# Patient Record
Sex: Male | Born: 1945
Health system: Southern US, Community
[De-identification: ages and names within clinical notes are randomized; demographics above are authoritative.]

## PROBLEM LIST (undated history)

## (undated) DIAGNOSIS — Z950 Presence of cardiac pacemaker: Secondary | ICD-10-CM

## (undated) DIAGNOSIS — I739 Peripheral vascular disease, unspecified: Secondary | ICD-10-CM

## (undated) DIAGNOSIS — R011 Cardiac murmur, unspecified: Secondary | ICD-10-CM

## (undated) DIAGNOSIS — M1990 Unspecified osteoarthritis, unspecified site: Secondary | ICD-10-CM

## (undated) DIAGNOSIS — I2510 Atherosclerotic heart disease of native coronary artery without angina pectoris: Secondary | ICD-10-CM

## (undated) DIAGNOSIS — I6529 Occlusion and stenosis of unspecified carotid artery: Secondary | ICD-10-CM

## (undated) DIAGNOSIS — I10 Essential (primary) hypertension: Secondary | ICD-10-CM

## (undated) HISTORY — DX: Unspecified osteoarthritis, unspecified site: M19.90

## (undated) HISTORY — PX: HERNIA REPAIR: SHX51

## (undated) HISTORY — DX: Cardiac murmur, unspecified: R01.1

## (undated) HISTORY — DX: Occlusion and stenosis of unspecified carotid artery: I65.29

## (undated) HISTORY — PX: CARDIAC SURGERY: SHX584

---
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
## 2017-04-17 HISTORY — PX: CARDIAC SURGERY: SHX584

## 2018-10-27 ENCOUNTER — Emergency Department (HOSPITAL_COMMUNITY): Payer: Medicare PPO

## 2018-10-27 ENCOUNTER — Encounter (HOSPITAL_COMMUNITY): Payer: Self-pay | Admitting: Emergency Medicine

## 2018-10-27 ENCOUNTER — Other Ambulatory Visit: Payer: Self-pay

## 2018-10-27 ENCOUNTER — Inpatient Hospital Stay (HOSPITAL_COMMUNITY)
Admission: EM | Admit: 2018-10-27 | Discharge: 2018-10-29 | DRG: 556 | Disposition: A | Discharge status: Home / Self Care | Payer: Medicare PPO | Attending: Interventional Cardiology | Admitting: Interventional Cardiology

## 2018-10-27 DIAGNOSIS — I708 Atherosclerosis of other arteries: Secondary | ICD-10-CM | POA: Diagnosis present

## 2018-10-27 DIAGNOSIS — I10 Essential (primary) hypertension: Secondary | ICD-10-CM | POA: Diagnosis present

## 2018-10-27 DIAGNOSIS — R319 Hematuria, unspecified: Secondary | ICD-10-CM | POA: Diagnosis not present

## 2018-10-27 DIAGNOSIS — Z1159 Encounter for screening for other viral diseases: Secondary | ICD-10-CM | POA: Diagnosis not present

## 2018-10-27 DIAGNOSIS — I2510 Atherosclerotic heart disease of native coronary artery without angina pectoris: Secondary | ICD-10-CM | POA: Diagnosis present

## 2018-10-27 DIAGNOSIS — I214 Non-ST elevation (NSTEMI) myocardial infarction: Secondary | ICD-10-CM | POA: Diagnosis present

## 2018-10-27 DIAGNOSIS — Z79899 Other long term (current) drug therapy: Secondary | ICD-10-CM

## 2018-10-27 DIAGNOSIS — Z7902 Long term (current) use of antithrombotics/antiplatelets: Secondary | ICD-10-CM

## 2018-10-27 DIAGNOSIS — R7989 Other specified abnormal findings of blood chemistry: Secondary | ICD-10-CM | POA: Diagnosis not present

## 2018-10-27 DIAGNOSIS — I700 Atherosclerosis of aorta: Secondary | ICD-10-CM | POA: Diagnosis present

## 2018-10-27 DIAGNOSIS — R739 Hyperglycemia, unspecified: Secondary | ICD-10-CM | POA: Diagnosis present

## 2018-10-27 DIAGNOSIS — M549 Dorsalgia, unspecified: Secondary | ICD-10-CM | POA: Diagnosis present

## 2018-10-27 DIAGNOSIS — Z7289 Other problems related to lifestyle: Secondary | ICD-10-CM

## 2018-10-27 DIAGNOSIS — R7303 Prediabetes: Secondary | ICD-10-CM | POA: Diagnosis present

## 2018-10-27 DIAGNOSIS — Z955 Presence of coronary angioplasty implant and graft: Secondary | ICD-10-CM | POA: Diagnosis not present

## 2018-10-27 DIAGNOSIS — I447 Left bundle-branch block, unspecified: Secondary | ICD-10-CM | POA: Diagnosis present

## 2018-10-27 DIAGNOSIS — I493 Ventricular premature depolarization: Secondary | ICD-10-CM | POA: Diagnosis present

## 2018-10-27 DIAGNOSIS — E860 Dehydration: Secondary | ICD-10-CM | POA: Diagnosis present

## 2018-10-27 DIAGNOSIS — E785 Hyperlipidemia, unspecified: Secondary | ICD-10-CM | POA: Diagnosis present

## 2018-10-27 DIAGNOSIS — I352 Nonrheumatic aortic (valve) stenosis with insufficiency: Secondary | ICD-10-CM | POA: Diagnosis present

## 2018-10-27 DIAGNOSIS — Z7982 Long term (current) use of aspirin: Secondary | ICD-10-CM | POA: Diagnosis not present

## 2018-10-27 DIAGNOSIS — I739 Peripheral vascular disease, unspecified: Secondary | ICD-10-CM | POA: Diagnosis present

## 2018-10-27 DIAGNOSIS — M25511 Pain in right shoulder: Secondary | ICD-10-CM

## 2018-10-27 DIAGNOSIS — M545 Low back pain: Secondary | ICD-10-CM | POA: Diagnosis not present

## 2018-10-27 DIAGNOSIS — M62838 Other muscle spasm: Secondary | ICD-10-CM | POA: Diagnosis present

## 2018-10-27 HISTORY — DX: Essential (primary) hypertension: I10

## 2018-10-27 HISTORY — DX: Atherosclerotic heart disease of native coronary artery without angina pectoris: I25.10

## 2018-10-27 HISTORY — DX: Peripheral vascular disease, unspecified: I73.9

## 2018-10-27 LAB — HEPATIC FUNCTION PANEL
ALT: 24 U/L (ref 0–44)
AST: 29 U/L (ref 15–41)
Albumin: 4.4 g/dL (ref 3.5–5.0)
Alkaline Phosphatase: 78 U/L (ref 38–126)
Bilirubin, Direct: 0.2 mg/dL (ref 0.0–0.2)
Indirect Bilirubin: 0.6 mg/dL (ref 0.3–0.9)
Total Bilirubin: 0.8 mg/dL (ref 0.3–1.2)
Total Protein: 7.3 g/dL (ref 6.5–8.1)

## 2018-10-27 LAB — CBC
HCT: 40.1 % (ref 39.0–52.0)
Hemoglobin: 13.8 g/dL (ref 13.0–17.0)
MCH: 31.9 pg (ref 26.0–34.0)
MCHC: 34.4 g/dL (ref 30.0–36.0)
MCV: 92.8 fL (ref 80.0–100.0)
Platelets: 237 10*3/uL (ref 150–400)
RBC: 4.32 MIL/uL (ref 4.22–5.81)
RDW: 13.6 % (ref 11.5–15.5)
WBC: 8.6 10*3/uL (ref 4.0–10.5)
nRBC: 0 % (ref 0.0–0.2)

## 2018-10-27 LAB — BASIC METABOLIC PANEL
Anion gap: 16 — ABNORMAL HIGH (ref 5–15)
BUN: 13 mg/dL (ref 8–23)
CO2: 20 mmol/L — ABNORMAL LOW (ref 22–32)
Calcium: 9.6 mg/dL (ref 8.9–10.3)
Chloride: 101 mmol/L (ref 98–111)
Creatinine, Ser: 1.15 mg/dL (ref 0.61–1.24)
GFR calc Af Amer: >60 mL/min (ref >60)
GFR calc non Af Amer: >60 mL/min (ref >60)
Glucose, Bld: 183 mg/dL — ABNORMAL HIGH (ref 70–99)
Potassium: 3.9 mmol/L (ref 3.5–5.1)
Sodium: 137 mmol/L (ref 135–145)

## 2018-10-27 LAB — TROPONIN I (HIGH SENSITIVITY)
Troponin I (High Sensitivity): 8 ng/L (ref <18)
Troponin I (High Sensitivity): 51 ng/L — ABNORMAL HIGH (ref <18)

## 2018-10-27 LAB — SARS CORONAVIRUS 2 BY RT PCR (HOSPITAL ORDER, PERFORMED IN ~~LOC~~ HOSPITAL LAB): SARS Coronavirus 2: NEGATIVE

## 2018-10-27 LAB — LIPASE, BLOOD: Lipase: 29 U/L (ref 11–51)

## 2018-10-27 MED ORDER — FENTANYL CITRATE (PF) 100 MCG/2ML IJ SOLN
100.0000 ug | Freq: Once | INTRAMUSCULAR | Status: DC
  Filled 2018-10-27: qty 2

## 2018-10-27 MED ORDER — ASPIRIN 81 MG PO CHEW
81.0000 mg | CHEWABLE_TABLET | Freq: Every day | ORAL | Status: DC
  Administered 2018-10-28 – 2018-10-29 (×2): 81 mg via ORAL
  Filled 2018-10-27 (×2): qty 1

## 2018-10-27 MED ORDER — ONDANSETRON HCL 4 MG/2ML IJ SOLN: 4.0000 mg | Freq: Four times a day (QID) | INTRAMUSCULAR | Status: DC | PRN

## 2018-10-27 MED ORDER — LORAZEPAM 2 MG/ML IJ SOLN
1.0000 mg | Freq: Once | INTRAMUSCULAR | Status: AC
  Administered 2018-10-27: 1 mg via INTRAVENOUS
  Filled 2018-10-27: qty 1

## 2018-10-27 MED ORDER — RAMIPRIL 10 MG PO CAPS
10.0000 mg | ORAL_CAPSULE | Freq: Every day | ORAL | Status: DC
  Administered 2018-10-28 – 2018-10-29 (×2): 10 mg via ORAL
  Filled 2018-10-27 (×2): qty 1

## 2018-10-27 MED ORDER — CLOPIDOGREL BISULFATE 75 MG PO TABS
75.0000 mg | ORAL_TABLET | Freq: Every day | ORAL | Status: DC
  Administered 2018-10-28 – 2018-10-29 (×2): 75 mg via ORAL
  Filled 2018-10-27 (×2): qty 1

## 2018-10-27 MED ORDER — HEPARIN BOLUS VIA INFUSION
4000.0000 [IU] | Freq: Once | INTRAVENOUS | Status: AC
  Administered 2018-10-28: 4000 [IU] via INTRAVENOUS
  Filled 2018-10-27: qty 4000

## 2018-10-27 MED ORDER — IOHEXOL 350 MG/ML SOLN
75.0000 mL | Freq: Once | INTRAVENOUS | Status: AC | PRN
  Administered 2018-10-27: 75 mL via INTRAVENOUS

## 2018-10-27 MED ORDER — HEPARIN (PORCINE) 25000 UT/250ML-% IV SOLN
1200.0000 [IU]/h | INTRAVENOUS | Status: DC
  Administered 2018-10-28: 1200 [IU]/h via INTRAVENOUS
  Filled 2018-10-27: qty 250

## 2018-10-27 MED ORDER — METOPROLOL TARTRATE 12.5 MG HALF TABLET
12.5000 mg | ORAL_TABLET | Freq: Two times a day (BID) | ORAL | Status: DC
  Filled 2018-10-27: qty 1

## 2018-10-27 MED ORDER — DIPHENHYDRAMINE HCL 25 MG PO CAPS: 25.0000 mg | ORAL_CAPSULE | Freq: Every evening | ORAL | Status: DC | PRN

## 2018-10-27 MED ORDER — NITROGLYCERIN 0.4 MG SL SUBL: 0.4000 mg | SUBLINGUAL_TABLET | SUBLINGUAL | Status: DC | PRN

## 2018-10-27 MED ORDER — INSULIN ASPART 100 UNIT/ML ~~LOC~~ SOLN
0.0000 [IU] | SUBCUTANEOUS | Status: DC
  Administered 2018-10-28 (×2): 2 [IU] via SUBCUTANEOUS

## 2018-10-27 MED ORDER — DIPHENHYDRAMINE HCL 25 MG PO TABS
25.0000 mg | ORAL_TABLET | Freq: Every evening | ORAL | Status: DC | PRN
  Filled 2018-10-27: qty 1

## 2018-10-27 MED ORDER — HYDROMORPHONE HCL 1 MG/ML IJ SOLN
0.5000 mg | Freq: Once | INTRAMUSCULAR | Status: AC
  Administered 2018-10-27: 0.5 mg via INTRAVENOUS
  Filled 2018-10-27: qty 1

## 2018-10-27 MED ORDER — SODIUM CHLORIDE 0.9 % IV BOLUS
1000.0000 mL | Freq: Once | INTRAVENOUS | Status: AC
  Administered 2018-10-27: 1000 mL via INTRAVENOUS

## 2018-10-27 MED ORDER — MORPHINE SULFATE (PF) 2 MG/ML IV SOLN
2.0000 mg | Freq: Once | INTRAVENOUS | Status: DC
  Filled 2018-10-27: qty 1

## 2018-10-27 MED ORDER — ASPIRIN 81 MG PO CHEW
324.0000 mg | CHEWABLE_TABLET | Freq: Once | ORAL | Status: AC
  Administered 2018-10-27: 324 mg via ORAL
  Filled 2018-10-27: qty 4

## 2018-10-27 MED ORDER — ASENAPINE MALEATE 5 MG SL SUBL
5.0000 mg | SUBLINGUAL_TABLET | Freq: Once | SUBLINGUAL | Status: AC
  Administered 2018-10-27: 5 mg via SUBLINGUAL
  Filled 2018-10-27: qty 1

## 2018-10-27 MED ORDER — ATORVASTATIN CALCIUM 80 MG PO TABS
80.0000 mg | ORAL_TABLET | Freq: Every day | ORAL | Status: DC
  Administered 2018-10-28 (×2): 80 mg via ORAL
  Filled 2018-10-27 (×2): qty 1

## 2018-10-27 NOTE — ED Notes (Signed)
Pt to ct 

## 2018-10-27 NOTE — Discharge Instructions (Signed)
You have been seen today for shoulder pain. Please read and follow all provided instructions. Return to the emergency room for worsening condition or new concerning symptoms.    1. Medications:  Prescription sent to your pharmacy for  This is a muscle relaxer and should only be taken as needed and directed. Please do not drive or work while taking this medication. Continue usual home medications  Take medications as prescribed. Please review all of the medicines and only take them if you do not have an allergy to them.   2. Treatment: rest, drink plenty of fluids  3. Follow Up: Please follow up with your primary doctor in 2-5 days for discussion of your diagnoses and further evaluation after today's visit; Call today to arrange your follow up.  If you do not have a primary care doctor use the resource guide provided to find one;  -You should call [facility] to establish care with a cardiologist.  Information has been provided in your discharge paperwork.  It is also a possibility that you have an allergic reaction to any of the medicines that you have been prescribed - Everybody reacts differently to medications and while MOST people have no trouble with most medicines, you may have a reaction such as nausea, vomiting, rash, swelling, shortness of breath. If this is the case, please stop taking the medicine immediately and contact your physician.  ?

## 2018-10-27 NOTE — H&P (Addendum)
Cardiology History & Physical    Patient ID: Glenn Collins Zieger MRN: [id number], DOB/AGE: 07-16-1945   Admit date: 10/27/2018  Primary Cardiologist: Dr. Charmayne SheerJames Crager [facility] not yet established in [place]  Patient Profile    Glenn Collins Vaile is a 73 y.o. male with a history of HTN, hyperlipidemia, mild aortic stenosis, recently diagnosed symptomatic PAD (medically managed), as well as CAD with PCI in 2007 (Cypher to mid RCA, prox RCA, mid LAD) and then in 2017 for progressive angina with overlapping DES for up to 90% ISR of the mid RCA stent. He is being seen today for the evaluation of back pain associated with elevated troponin and TWI on ECG.   History of Present Illness    While walking to his kitchen shortly after waking up this morning he describes sudden onset pain below his right scapula around 9am that felt like fairly severe muscle spasms. He thinks there may have been some minimal improvement initially with a heating pad, but the pain became worse to a constant 10/10 that came around his right side and was associated with nausea and "cold sweats" by around 1pm. He has had much more mild "back spasms" like this before but not for a long time and nothing this severe. He denies any associated dyspnea, stroke-like symptoms, or actual chest pain. This did not feel like his prior angina which was exertional substernal burning radiating to his arms and neck. He still tries to walk every day, but has been limited more recently by angina and frequently has to stop to rest. Given these limitations, he does not have any angina. He has not missed any of his medications. Denies any abnormal bleeding.  In the ED he was bradycardic to the 40s-50s and hypertensive on arrival. ECG showed SR with anterior TWI not mentioned on prior ECG reads from [place]. HS troponin increased from 8 to 51. CTA was negative for dissection or PE - there was an area hypodense area surrounding the lateral edge of the descending  aorta that was reviewed by multiple radiologists, including IR, as well as CT surgery, and all agreed not consistent with dissection. He was given dilaudid and Ativan with marked improvement, currently 3-4/10.   Past Medical History   Past Medical History:  Diagnosis Date   CAD (coronary artery disease) 10/27/2018   Coronary artery disease    Hypertension    PAD (peripheral artery disease) (HCC) 10/27/2018    Past Surgical History:  Procedure Laterality Date   CARDIAC SURGERY       Allergies No Known Allergies  Home Medications    Prior to Admission medications   Medication Sig Start Date End Date Taking? Authorizing Provider  aspirin EC 81 MG tablet Take 81 mg by mouth daily.   Yes [provider]  atorvastatin (LIPITOR) 80 MG tablet Take 80 mg by mouth daily at 6 PM.   Yes [provider]  clopidogrel (PLAVIX) 75 MG tablet Take 75 mg by mouth daily.   Yes [provider]  diphenhydrAMINE (BENADRYL) 25 MG tablet Take 25 mg by mouth at bedtime as needed for sleep.   Yes [provider]  metoprolol tartrate (LOPRESSOR) 25 MG tablet Take 12.5 mg by mouth 2 (two) times daily.   Yes [provider]  ramipril (ALTACE) 10 MG capsule Take 10 mg by mouth daily.   Yes [provider]    Family History    No family history on file. has no family status information on file.  Social History    Social History   Socioeconomic History   Marital status: Married    Spouse name: Not on file   Number of children: Not on file   Years of education: Not on file   Highest education level: Not on file  Occupational History   Not on file  Social Needs   Financial resource strain: Not on file   Food insecurity    Worry: Not on file    Inability: Not on file   Transportation needs    Medical: Not on file    Non-medical: Not on file  Tobacco Use   Smoking status: Former Smoker  Substance and Sexual Activity   Alcohol  use: Yes   Drug use: Never   Sexual activity: Not on file  Lifestyle   Physical activity    Days per week: Not on file    Minutes per session: Not on file   Stress: Not on file  Relationships   Social connections    Talks on phone: Not on file    Gets together: Not on file    Attends religious service: Not on file    Active member of club or organization: Not on file    Attends meetings of clubs or organizations: Not on file    Relationship status: Not on file   Intimate partner violence    Fear of current or ex partner: Not on file    Emotionally abused: Not on file    Physically abused: Not on file    Forced sexual activity: Not on file  Other Topics Concern   Not on file  Social History Narrative   Not on file     Review of Systems    General:  No chills, fever, night sweats or weight changes.  Cardiovascular:  No chest pain, dyspnea on exertion, edema, orthopnea, palpitations, paroxysmal nocturnal dyspnea. Dermatological: "knot" on back. No rash.  Respiratory: No cough, dyspnea Urologic: No hematuria, dysuria Abdominal:   +Nausea now resolved. No vomiting, diarrhea, hematochezia, melena, or hematemesis Neurologic:  No visual changes, weakness, changes in mental status. All other systems reviewed and are otherwise negative except as noted above.  Physical Exam    BP (!) 142/68    Pulse 62    Resp 14    Ht 6' (1.829 m)    Wt 90 kg    SpO2 98%    BMI 26.91 kg/m  General: Alert, NAD HEENT: Normal  Neck: No bruits or JVD. Lungs:  Resp regular and unlabored, CTA bilaterally. Heart: Regular rhythm with short pause occasionally, 2/6 early peaking systolic murmur at base radiating to carotids, 2-3/6 holosystolic murmur at apex. Back: approx 3 cm freely mobile soft tissue nodule inferior to right scapula, nontender. No CVA tenderness Abdomen: Soft, non-tender, non-distended, BS +.  Extremities: Cool from ankles distally, otherwise warm. No clubbing, cyanosis or  edema.  DP pulses 1+ and radial pulses 1-2+ bilaterally and equal.  Psych: Normal affect. Neuro: Alert and oriented. No gross focal deficits. No abnormal movements.  Labs    HS Troponin:  8 -> 51  Lab Results  Component Value Date   WBC 8.6 10/27/2018   HGB 13.8 10/27/2018   HCT 40.1 10/27/2018   MCV 92.8 10/27/2018   PLT 237 10/27/2018    Recent Labs  Lab 10/27/18 1414  NA 137  K 3.9  CL 101  CO2 20*  BUN 13  CREATININE 1.15  CALCIUM 9.6  PROT 7.3  BILITOT  0.8  ALKPHOS 78  ALT 24  AST 29  GLUCOSE 183*   No results found for: CHOL, HDL, LDLCALC, TRIG No results found for: [facility]   Radiology Studies    Dg Chest Port 1 View  Result Date: 10/27/2018 CLINICAL DATA:  Back pain. EXAM: PORTABLE CHEST 1 VIEW COMPARISON:  None. FINDINGS: Cardiomediastinal silhouette is normal. Mediastinal contours appear intact. Tortuosity and calcific atherosclerotic disease of the aorta. There is no evidence of focal airspace consolidation, pleural effusion or pneumothorax. Osseous structures are without acute abnormality. Soft tissues are grossly normal. IMPRESSION: No active disease. Electronically Signed   By: Fidela Salisbury M.D.   On: 10/27/2018 14:58   Ct Angio Chest Aorta W And/or Wo Contrast  Addendum Date: 10/27/2018   ADDENDUM REPORT: 10/27/2018 20:48 ADDENDUM: Hila additional finding not mentioned in the report. There is a 9 mm right lower lobe calcified lung nodule, either representing large granuloma or a hamartoma. Slight increased density along the distal arch and descending thoracic aorta, felt to be within the lung parenchyma and not related to intramural hematoma or dissection. Electronically Signed   By: Donavan Foil M.D.   On: 10/27/2018 20:48   Result Date: 10/27/2018 CLINICAL DATA:  Back pain history of cardiac surgery EXAM: CT ANGIOGRAPHY CHEST WITH CONTRAST TECHNIQUE: Multidetector CT imaging of the chest was performed using the standard protocol during bolus  administration of intravenous contrast. Multiplanar CT image reconstructions and MIPs were obtained to evaluate the vascular anatomy. CONTRAST:  14mL OMNIPAQUE IOHEXOL 350 MG/ML SOLN COMPARISON:  None. FINDINGS: Cardiovascular: Satisfactory opacification of the pulmonary arteries to the segmental level. No evidence of pulmonary embolism. Ectatic ascending aorta up to 3.7 cm. No dissection is seen. Moderate aortic atherosclerosis. Coronary vascular calcification. Mild cardiomegaly. No pericardial effusion. Calcification and moderate narrowing of the right subclavian artery at its origin. Mediastinum/Nodes: No enlarged mediastinal, hilar, or axillary lymph nodes. Thyroid gland, trachea, and esophagus demonstrate no significant findings. Lungs/Pleura: Lungs are clear. No pleural effusion or pneumothorax. Upper Abdomen: No acute abnormality. Musculoskeletal: Degenerative changes. No acute or suspicious osseous abnormality. Review of the MIP images confirms the above findings. IMPRESSION: 1. Negative for acute pulmonary embolus or aortic dissection. 2. No acute pulmonary infiltrate. Aortic Atherosclerosis (ICD10-I70.0). Electronically Signed: By: Donavan Foil M.D. On: 10/27/2018 20:10    ECG & Cardiac Imaging    ECG: sinus bradycardia with sinus arrhythmia and occasional junctional escape beat, anterior/anteroseptal TWI consistent with ischemia - personally reviewed.  TTE 10/2017: EF 60%, mild AS and AI, mean gradient 12.6.  Assessment & Plan    CAD with possible NSTEMI Prior remote PCI to RCA and LAD with ISR of mid-RCA treated with overlapping stents in 2017 for worsening angina and has done well since then. His back pain sounds musculoskeletal though was associated with associated with nausea and diaphoresis and I do not have a good alternate explanation for his elevated troponin and ischemic t-wave changes. After personal review with radiology, his CTA is negative for dissection or PE. He is also  incredibly high risk with extensive and progressive vascular disease so will treat empirically for NSTEMI for now. Additionally, his claudication is now limiting and could be masking anginal symptoms. - Trend troponin and repeat ECG on admission - TTE, lipids, and A1c - Received full dose aspirin. Continue DAPT with aspirin and Plavix - Continue home atorvastatin 80mg  - Hold metoprolol for tonight due to sinus bradycardia. Sublingual nitro prn. - NPO for likely left heart cath tomorrow. Received  1L NS in ED.   Acute back pain: seems musculoskeletal by history, soft tissue nodule on exam and on CT, though otherwise exam unrevealing. CTA negative for VTE or acute aortic syndrome. There is moderate subclavian stenosis of the subclavian but likely un-releated. Some radiation to superior right flank, but probably too high for nephroureteral etiology. Significantly improved with opiate+benzo given in ED.   - Valium 2mg  + Tylenol - Check UA - Outpatient follow-up  Hypertension  Pressure now normalized with pain control and SL nitro.  - Home meds - ramipril and metoprolol  Unhealthy Alcohol Use: Drinks 3-4 glasses of wine per night. Counseled on alcohol use. Will hold off on CIWA for now and monitor.   PAD: ABIs 0.5-0.6 bilaterally with claudication and waveform c/w aorto-iliac disease. Extensive vascular calcification on prior abdominal imaging from 2015. Non-smoker. No evidence of limb ischemia.  - On DAPT and high-intensity statin.  - Referral to exercise program at discharge/follow-up  Reported borderline diabetes, mild hyperglycemia: - Check A1c - SSI with hyperglycemia protocol  Nutrition: Heart healthy, NPO at midnight DVT ppx: therapeutic heparin GI ppx: n/a Advanced Care Planning: Full Code   Signed, Clerance LavScott W Tamora Huneke, MD 10/27/2018, 10:14 PM

## 2018-10-27 NOTE — ED Provider Notes (Signed)
Care assumed from [place] PA-C.  Please see her full H&P.  In short,  Glenn Collins is a 73 y.o. male presents for right shoulder spasms.  He has cardiac history as well as his age and risk factors cardiac work-up was initiated.  CBC is normal, CMP suggestive of dehydration with low bicarb of 20, slightly elevated anion gap and hyperglycemia of 183.  Patient has no history of diabetes.  He was out in the sun for a long time yesterday while washing his car and had a little p.o. intake.  High-sensitivity troponin is 8.  Second troponin pending, IVF. On my assessment patient reports shoulder pain has improved with ice pack. He continues to have right sided chest pain however.  Physical Exam  BP (!) 144/49   Pulse (!) 46   Resp 10   Ht 6' (1.829 m)   Wt 90 kg   SpO2 97%   BMI 26.91 kg/m     Physical Exam  Vital signs and nursing note reviewed.  PE: Constitutional: well-developed, well-nourished, no apparent distress.  Resting comfortably on exam stretcher. HENT: normocephalic, atraumatic. no cervical adenopathy Cardiovascular: normal rhythm, bradycardic, distal pulses intact Pulmonary/Chest: effort normal; breath sounds clear and equal bilaterally; no wheezes or rales Abdominal: soft and nontender Musculoskeletal: full ROM, no edema Neurological: alert with goal directed thinking Skin: warm and dry, no rash, no diaphoresis Psychiatric: normal mood and affect, normal behavior    ED Course/Procedures   Results for orders placed or performed during the hospital encounter of 10/27/18 (from the past 24 hour(s))  Basic metabolic panel     Status: Abnormal   Collection Time: 10/27/18  2:14 PM  Result Value Ref Range   Sodium 137 135 - 145 mmol/L   Potassium 3.9 3.5 - 5.1 mmol/L   Chloride 101 98 - 111 mmol/L   CO2 20 (L) 22 - 32 mmol/L   Glucose, Bld 183 (H) 70 - 99 mg/dL   BUN 13 8 - 23 mg/dL   Creatinine, Ser 1.15 0.61 - 1.24 mg/dL   Calcium 9.6 8.9 - 10.3 mg/dL   GFR calc non  Af Amer >60 >60 mL/min   GFR calc Af Amer >60 >60 mL/min   Anion gap 16 (H) 5 - 15  CBC     Status: None   Collection Time: 10/27/18  2:14 PM  Result Value Ref Range   WBC 8.6 4.0 - 10.5 K/uL   RBC 4.32 4.22 - 5.81 MIL/uL   Hemoglobin 13.8 13.0 - 17.0 g/dL   HCT 40.1 39.0 - 52.0 %   MCV 92.8 80.0 - 100.0 fL   MCH 31.9 26.0 - 34.0 pg   MCHC 34.4 30.0 - 36.0 g/dL   RDW 13.6 11.5 - 15.5 %   Platelets 237 150 - 400 K/uL   nRBC 0.0 0.0 - 0.2 %  Troponin I (High Sensitivity)     Status: None   Collection Time: 10/27/18  2:14 PM  Result Value Ref Range   Troponin I (High Sensitivity) 8 <18 ng/L  Hepatic function panel     Status: None   Collection Time: 10/27/18  2:14 PM  Result Value Ref Range   Total Protein 7.3 6.5 - 8.1 g/dL   Albumin 4.4 3.5 - 5.0 g/dL   AST 29 15 - 41 U/L   ALT 24 0 - 44 U/L   Alkaline Phosphatase 78 38 - 126 U/L   Total Bilirubin 0.8 0.3 - 1.2 mg/dL   Bilirubin,  Direct 0.2 0.0 - 0.2 mg/dL   Indirect Bilirubin 0.6 0.3 - 0.9 mg/dL  Lipase, blood     Status: None   Collection Time: 10/27/18  2:14 PM  Result Value Ref Range   Lipase 29 11 - 51 U/L   PORTABLE CHEST 1 VIEW COMPARISON: None. FINDINGS: Cardiomediastinal silhouette is normal. Mediastinal contours appear intact. Tortuosity and calcific atherosclerotic disease of the aorta. There is no evidence of focal airspace consolidation, pleural effusion or pneumothorax. Osseous structures are without acute abnormality. Soft tissues are grossly normal. IMPRESSION: No active disease. Electronically Signed By: Ted Mcalpineobrinka Dimitrova M.D. On: 10/27/2018 14:58     MDM   Pt is stable, resting comfortably on stretcher. He has been bradycardic while in ED. HS troponin 8, delta 51. Given pt's age, cardiac history, and troponins will consult cardiology for admission. EKG concerning for ischemia.  Spoke with Dr. Okey Dupreose who agrees to assume care of patient and bring into the hospital for further evaluation and  management.     This note was prepared using Dragon voice recognition software and may include unintentional dictation errors due to the inherent limitations of voice recognition software.     Sherene Sireslbrizze, Shawna Wearing E, PA-C 10/28/18 0258    Gerhard MunchLockwood, Robert, MD 10/28/18 [id number]

## 2018-10-27 NOTE — Progress Notes (Signed)
ANTICOAGULATION CONSULT NOTE - Initial Consult  Pharmacy Consult for heparin Indication: chest pain/ACS  No Known Allergies  Patient Measurements: Height: 6' (182.9 cm) Weight: 198 lb 6.6 oz (90 kg) IBW/kg (Calculated) : 77.6 Heparin Dosing Weight: 90 kg  Vital Signs: BP: 142/68 (07/12 2100) Pulse Rate: 62 (07/12 2100)  Labs: Recent Labs    10/27/18 1414 10/27/18 1700  HGB 13.8  --   HCT 40.1  --   PLT 237  --   CREATININE 1.15  --   TROPONINIHS 8 51*    Estimated Creatinine Clearance: 62.8 mL/min (by C-G formula based on SCr of 1.15 mg/dL).   Medical History: Past Medical History:  Diagnosis Date  . CAD (coronary artery disease) 10/27/2018  . Coronary artery disease   . Hypertension   . PAD (peripheral artery disease) ([facility]) 10/27/2018    Medications:  See medication history  Assessment: 73 yo man to start heparin for CP.  He was not on anticoagulation PTA. Hg 13.8, PTLC 237   Goal of Therapy:  Heparin level 0.3-0.7 units/ml Monitor platelets by anticoagulation protocol: Yes   Plan:  Heparin bolus 4000 units and drip at 1200 units/hr Check heparin level 6-8 hours after start Daily HL and CBC while on heparin Monitor for bleeding complications  Thanks for allowing pharmacy to be a part of this patient's care.  Excell Seltzer, PharmD Clinical Pharmacist 10/27/2018,10:22 PM

## 2018-10-27 NOTE — ED Notes (Signed)
Consult at the bedside.

## 2018-10-27 NOTE — ED Provider Notes (Signed)
[facility] Provider Note   CSN: [id number] Arrival date & time: 10/27/18  1407     History   Chief Complaint Chief Complaint  Patient presents with  . Abdominal Pain  . Shoulder Pain    HPI Glenn LeakRichard Collins is a 73 y.o. male who presents with R shoulder pain.  Past medical history significant for coronary artery disease status post stents. He states that the pain in his R shoulder blade started at 9AM this morning. The pain is constant and feels like severe spasms.  It radiates around the right lateral chest wall. He denies chest or abdominal pain or SOB. He tried a heating pad with no relief. The spasms wouldn't stop so EMS was called. They gave Zofran and Fentanyl without significant relief.. It feels better when he puts pressure on his abdomen. He denies fever, chills, cough, N/V, leg swelling. He has never had this before. He states he was washing his car yesterday and that might have something to do with it. He denies trauma to the shoulder.  He states that this feels unlike the chest pain that he had that led up to his cath.  [place] and moved to the area about 1 year ago.Marland Kitchen.  He has yet to establish care with a PCP or cardiologist.  HPI  No past medical history on file.  There are no active problems to display for this patient.    The histories are not reviewed yet. Please review them in the "History" navigator section and refresh this SmartLink.      Home Medications    Prior to Admission medications   Not on File    Family History No family history on file.  Social History Social History   Tobacco Use  . Smoking status: Not on file  Substance Use Topics  . Alcohol use: Not on file  . Drug use: Not on file     Allergies   Patient has no known allergies.   Review of Systems Review of Systems  Respiratory: Negative for shortness of breath.   Cardiovascular: Negative for chest pain.   Gastrointestinal: Negative for abdominal pain.  Musculoskeletal: Positive for arthralgias and myalgias.  Neurological: Negative for syncope and light-headedness.  All other systems reviewed and are negative.    Physical Exam Updated Vital Signs BP (!) 161/93   Pulse (!) 55   Resp (!) 24   Ht 6' (1.829 m)   Wt 90 kg   SpO2 100%   BMI 26.91 kg/m   Physical Exam Vitals signs and nursing note reviewed.  Constitutional:      General: He is in acute distress (in pain).     Appearance: He is well-developed. He is not ill-appearing.  HENT:     Head: Normocephalic and atraumatic.  Eyes:     General: No scleral icterus.       Right eye: No discharge.        Left eye: No discharge.     Conjunctiva/sclera: Conjunctivae normal.     Pupils: Pupils are equal, round, and reactive to light.  Neck:     Musculoskeletal: Normal range of motion.  Cardiovascular:     Rate and Rhythm: Normal rate and regular rhythm.  Pulmonary:     Effort: Pulmonary effort is normal. No respiratory distress.     Breath sounds: Normal breath sounds.  Chest:     Chest wall: No tenderness.  Abdominal:     General:  There is no distension.     Palpations: Abdomen is soft.     Tenderness: There is no abdominal tenderness.  Musculoskeletal:     Comments: FROM of R shoulder. Palpable spasms felt under the R scapula  Skin:    General: Skin is warm and dry.  Neurological:     Mental Status: He is alert and oriented to person, place, and time.  Psychiatric:        Behavior: Behavior normal.      ED Treatments / Results  Labs (all labs ordered are listed, but only abnormal results are displayed) Labs Reviewed  BASIC METABOLIC PANEL - Abnormal; Notable for the following components:      Result Value   CO2 20 (*)    Glucose, Bld 183 (*)    Anion gap 16 (*)    All other components within normal limits  CBC  HEPATIC FUNCTION PANEL  LIPASE, BLOOD  TROPONIN I (HIGH SENSITIVITY)  TROPONIN I (HIGH  SENSITIVITY)    EKG EKG Interpretation  Date/Time:  Sunday October 27 2018 14:11:18 EDT Ventricular Rate:  53 PR Interval:    QRS Duration: 110 QT Interval:  451 QTC Calculation: 403 R Axis:   43 Text Interpretation:  Sinus or ectopic atrial bradycardia Incomplete left bundle branch block Abnormal T, consider ischemia, anterior leads no STEMI but ischemic appearing t wave inversions with no old comparison Confirmed by Charlesetta Shanks [id number]) on 10/27/2018 2:31:42 PM   Radiology Dg Chest Port 1 View  Result Date: 10/27/2018 CLINICAL DATA:  Back pain. EXAM: PORTABLE CHEST 1 VIEW COMPARISON:  None. FINDINGS: Cardiomediastinal silhouette is normal. Mediastinal contours appear intact. Tortuosity and calcific atherosclerotic disease of the aorta. There is no evidence of focal airspace consolidation, pleural effusion or pneumothorax. Osseous structures are without acute abnormality. Soft tissues are grossly normal. IMPRESSION: No active disease. Electronically Signed   By: Fidela Salisbury M.D.   On: 10/27/2018 14:58    Procedures Procedures (including critical care time)  Medications Ordered in ED Medications  sodium chloride 0.9 % bolus 1,000 mL (has no administration in time range)  LORazepam (ATIVAN) injection 1 mg (1 mg Intravenous Given 10/27/18 1429)  HYDROmorphone (DILAUDID) injection 0.5 mg (0.5 mg Intravenous Given 10/27/18 1430)  aspirin chewable tablet 324 mg (324 mg Oral Given 10/27/18 1431)     Initial Impression / Assessment and Plan / ED Course  I have reviewed the triage vital signs and the nursing notes.  Pertinent labs & imaging results that were available during my care of the patient were reviewed by me and considered in my medical decision making (see chart for details).  73 year old male presents with acute right shoulder pain this morning.  Feels like severe spasms.  He does not have any chest pain or abdominal pain.  It doesn't feel like angina that he's had  before. There is a palpable muscle spasms felt on exam. Vitals are remarkable for hypertension and bradycardia. EKG is sinus bradycardia with T wave inversions.  We have no EKG to compare this to since he is relatively new to the area.  We will give Ativan and Dilaudid and check labs, chest x-ray, troponin.  Shared visit with Dr. Colvin Caroli.  CBC is normal.  CMP is remarkable for mildly low bicarb 20, hyperglycemia 183, slightly elevated anion gap.  HS-Trop is 8. Patient states he is out in the side for a long time yesterday and feels a little dehydrated.  We will give IV fluid bolus.  He appears much more comfortable on repeat exam and states pain is a 5 out of 10 at this time.  We will continue to monitor.  Will plan on delta trop. Care signed out to K Albrizze PA-C who will dispo  Final Clinical Impressions(s) / ED Diagnoses   Final diagnoses:  Acute pain of right shoulder    ED Discharge Orders    None       Bethel BornGekas, Kelly Marie, PA-C 10/27/18 1634    Arby BarrettePfeiffer, Marcy, MD 11/06/18 1232

## 2018-10-27 NOTE — ED Triage Notes (Signed)
Arrived via EMS developed sudden onset of right shoulder blade pain radiating to back and general abdominal pain. Pain currently 9/10 achy sharp. EMS administered Zofran 4mg  and Fentanyl 100 mcg prior to arrival. Alert answering and following commands appropriate.

## 2018-10-27 NOTE — ED Provider Notes (Addendum)
Medical screening examination/treatment/procedure(s) were conducted as a shared visit with non-physician practitioner(s) and myself.  I personally evaluated the patient during the encounter.    Patient felt that he was having spasms under his shoulder blade starting this morning on the right.  He was trying heating pad but it continued to get worse and worse until is very severe this afternoon.  Finally had to come to the emergency department.  He denies he is felt short of breath.  He denies the pain is really radiating to his chest or his arm.  Patient was given fentanyl and Zofran by EMS without relief.  Patient is alert and appropriate.  Mental status is clear.  He appears to be in severe pain and is holding his shoulder.  No respiratory distress.  Lungs clear.  Heart regular.  Patient has intensely reproducible pain along the medial scapula on the right shoulder. No Lower extremity swelling or calf pain  Plan will be for cardiac work-up.  Patient does have EKG with some biphasic T waves concerning for ischemic changes but no STEMI without old comparison tracing available.  Presentation is somewhat atypical but with patient's age and risk factors will proceed with work-up while working on pain control.  I agree with plan and management.  EKG Interpretation  Date/Time:  Sunday October 27 2018 14:11:18 EDT Ventricular Rate:  53 PR Interval:    QRS Duration: 110 QT Interval:  451 QTC Calculation: 403 R Axis:   43 Text Interpretation:  Sinus or ectopic atrial bradycardia Incomplete left bundle branch block Abnormal T, consider ischemia, anterior leads no STEMI but ischemic appearing t wave inversions with no old comparison Confirmed by Charlesetta Shanks [phone]) on 10/27/2018 2:31:42 PM   Charlesetta Shanks, MD 10/27/18 [place], [place], MD 10/27/18 1432

## 2018-10-27 NOTE — ED Notes (Signed)
The pt has no complaints 

## 2018-10-28 ENCOUNTER — Encounter (HOSPITAL_COMMUNITY): Payer: Self-pay

## 2018-10-28 ENCOUNTER — Inpatient Hospital Stay (HOSPITAL_COMMUNITY): Payer: Medicare PPO

## 2018-10-28 ENCOUNTER — Other Ambulatory Visit: Payer: Self-pay

## 2018-10-28 DIAGNOSIS — I10 Essential (primary) hypertension: Secondary | ICD-10-CM

## 2018-10-28 DIAGNOSIS — R7989 Other specified abnormal findings of blood chemistry: Secondary | ICD-10-CM

## 2018-10-28 DIAGNOSIS — M549 Dorsalgia, unspecified: Secondary | ICD-10-CM

## 2018-10-28 DIAGNOSIS — I214 Non-ST elevation (NSTEMI) myocardial infarction: Secondary | ICD-10-CM

## 2018-10-28 DIAGNOSIS — I739 Peripheral vascular disease, unspecified: Secondary | ICD-10-CM

## 2018-10-28 LAB — ECHOCARDIOGRAM COMPLETE
Height: 72 in
Weight: 3224 oz

## 2018-10-28 LAB — BASIC METABOLIC PANEL
Anion gap: 13 (ref 5–15)
BUN: 13 mg/dL (ref 8–23)
CO2: 20 mmol/L — ABNORMAL LOW (ref 22–32)
Calcium: 9.1 mg/dL (ref 8.9–10.3)
Chloride: 103 mmol/L (ref 98–111)
Creatinine, Ser: 0.88 mg/dL (ref 0.61–1.24)
GFR calc Af Amer: >60 mL/min (ref >60)
GFR calc non Af Amer: >60 mL/min (ref >60)
Glucose, Bld: 123 mg/dL — ABNORMAL HIGH (ref 70–99)
Potassium: 4.2 mmol/L (ref 3.5–5.1)
Sodium: 136 mmol/L (ref 135–145)

## 2018-10-28 LAB — MAGNESIUM: Magnesium: 2.3 mg/dL (ref 1.7–2.4)

## 2018-10-28 LAB — HEMOGLOBIN A1C
Hgb A1c MFr Bld: 5.5 % (ref 4.8–5.6)
Mean Plasma Glucose: 111.15 mg/dL

## 2018-10-28 LAB — URINALYSIS, ROUTINE W REFLEX MICROSCOPIC
Bacteria, UA: NONE SEEN
Bilirubin Urine: NEGATIVE
Glucose, UA: NEGATIVE mg/dL
Ketones, ur: 20 mg/dL — AB
Leukocytes,Ua: NEGATIVE
Nitrite: NEGATIVE
Protein, ur: 30 mg/dL — AB
RBC / HPF: >50 RBC/hpf — ABNORMAL HIGH (ref 0–5)
Specific Gravity, Urine: >1.046 — ABNORMAL HIGH (ref 1.005–1.030)
pH: 5 (ref 5.0–8.0)

## 2018-10-28 LAB — TROPONIN I (HIGH SENSITIVITY)
Troponin I (High Sensitivity): 90 ng/L — ABNORMAL HIGH (ref <18)
Troponin I (High Sensitivity): 97 ng/L — ABNORMAL HIGH (ref <18)

## 2018-10-28 LAB — GLUCOSE, CAPILLARY
Glucose-Capillary: 103 mg/dL — ABNORMAL HIGH (ref 70–99)
Glucose-Capillary: 104 mg/dL — ABNORMAL HIGH (ref 70–99)
Glucose-Capillary: 120 mg/dL — ABNORMAL HIGH (ref 70–99)
Glucose-Capillary: 126 mg/dL — ABNORMAL HIGH (ref 70–99)
Glucose-Capillary: 127 mg/dL — ABNORMAL HIGH (ref 70–99)
Glucose-Capillary: 96 mg/dL (ref 70–99)

## 2018-10-28 LAB — PSA: Prostatic Specific Antigen: 4.48 ng/mL — ABNORMAL HIGH (ref 0.00–4.00)

## 2018-10-28 LAB — HEPARIN LEVEL (UNFRACTIONATED)
Heparin Unfractionated: 0.17 IU/mL — ABNORMAL LOW (ref 0.30–0.70)
Heparin Unfractionated: 0.48 IU/mL (ref 0.30–0.70)
Heparin Unfractionated: 0.78 IU/mL — ABNORMAL HIGH (ref 0.30–0.70)

## 2018-10-28 LAB — BRAIN NATRIURETIC PEPTIDE: B Natriuretic Peptide: 298 pg/mL — ABNORMAL HIGH (ref 0.0–100.0)

## 2018-10-28 MED ORDER — DIAZEPAM 2 MG PO TABS
2.0000 mg | ORAL_TABLET | Freq: Once | ORAL | Status: AC
  Administered 2018-10-28: 2 mg via ORAL
  Filled 2018-10-28: qty 1

## 2018-10-28 MED ORDER — ACETAMINOPHEN 500 MG PO TABS
1000.0000 mg | ORAL_TABLET | Freq: Once | ORAL | Status: AC
  Administered 2018-10-28: 1000 mg via ORAL
  Filled 2018-10-28: qty 2

## 2018-10-28 MED ORDER — SODIUM CHLORIDE 0.9 % WEIGHT BASED INFUSION
1.0000 mL/kg/h | INTRAVENOUS | Status: DC
  Administered 2018-10-28: 1 mL/kg/h via INTRAVENOUS

## 2018-10-28 MED ORDER — SODIUM CHLORIDE 0.9 % WEIGHT BASED INFUSION
3.0000 mL/kg/h | INTRAVENOUS | Status: AC
  Administered 2018-10-28: 3 mL/kg/h via INTRAVENOUS

## 2018-10-28 NOTE — Progress Notes (Signed)
CRITICAL VALUE ALERT  Critical Value:  Troponin 90  Date & Time Notied:  7/13 at 0110  Provider Notified: Dr. Kalman Shan on call MD for cardiology  Orders Received/Actions taken: pending.

## 2018-10-28 NOTE — Plan of Care (Signed)
  Problem: Education: Goal: Knowledge of General Education information will improve Description: Including pain rating scale, medication(s)/side effects and non-pharmacologic comfort measures Outcome: Progressing   Problem: Clinical Measurements: Goal: Cardiovascular complication will be avoided Outcome: Progressing   Problem: Pain Managment: Goal: General experience of comfort will improve Outcome: Progressing   

## 2018-10-28 NOTE — Progress Notes (Addendum)
The patient has been seen in conjunction with Reino Bellis, NP. All aspects of care have been considered and discussed. The patient has been personally interviewed, examined, and all clinical data has been reviewed.   Hematuria this morning. ? Etiology.  The atypical nature of the back discomfort in conjunction with hematuria raises question of a GU process such as kidney stone or some other issue.  We will get an abdominal ultrasound to rule out hydronephrosis or structural kidney abnormality.  He will need to have a PSA done and be scheduled to see a urologist.  Symptoms are atypical for prior anginal complaints/ACS.  Cardiac markers are flat.  ECG reveals no evolution.  We will ambulate the patient and probably not perform any further cardiac evaluation.  Likely discharge within the next 12 to 24 hours.  Echo is pending.  Progress Note  Patient Name: Glenn Collins Date of Encounter: 10/28/2018  Primary Cardiologist: No primary care provider on file.   Subjective   Back pain has significantly improved. No chest pain.   Inpatient Medications    Scheduled Meds:  aspirin  81 mg Oral Daily   atorvastatin  80 mg Oral q1800   clopidogrel  75 mg Oral Daily   insulin aspart  0-15 Units Subcutaneous Q4H   metoprolol tartrate  12.5 mg Oral BID   ramipril  10 mg Oral Daily   Continuous Infusions:  heparin 1,200 Units/hr (10/28/18 0000)   PRN Meds: diphenhydrAMINE, nitroGLYCERIN, ondansetron (ZOFRAN) IV   Vital Signs    Vitals:   10/27/18 2230 10/27/18 2300 10/27/18 2312 10/28/18 0450  BP: 136/62  (!) 143/78 (!) 155/63  Pulse: (!) 58  77 (!) 52  Resp: 11  12 14   Temp:   98.9 F (37.2 C) 98.1 F (36.7 C)  TempSrc:   Oral Oral  SpO2: 98%  99% 99%  Weight:  91.4 kg  91.4 kg  Height:  6' (1.829 m)      Intake/Output Summary (Last 24 hours) at 10/28/2018 0903 Last data filed at 10/28/2018 0456 Gross per 24 hour  Intake 1024 ml  Output 450 ml  Net 574 ml    Last 3 Weights 10/28/2018 10/27/2018 10/27/2018  Weight (lbs) 201 lb 9.6 oz 201 lb 9.6 oz 198 lb 6.6 oz  Weight (kg) 91.445 kg 91.445 kg 90 kg      Telemetry    Sinus Loletha Grayer- Personally Reviewed  ECG    SR with sinus arrhythmia. PVCs and TWI in anterior leads  - Personally Reviewed  Physical Exam  Pleasant older WM GEN: No acute distress.   Neck: No JVD Cardiac: RRR, + systolic murmur, no rubs, or gallops.  Respiratory: Clear to auscultation bilaterally. GI: Soft, nontender, non-distended  MS: No edema; No deformity. Neuro:  Nonfocal  Psych: Normal affect   Labs    High Sensitivity Troponin:   Recent Labs  Lab 10/27/18 1414 10/27/18 1700 10/28/18 0012 10/28/18 0504  TROPONINIHS 8 51* 90* 97*      Cardiac EnzymesNo results for input(s): TROPONINI in the last 168 hours. No results for input(s): TROPIPOC in the last 168 hours.   Chemistry Recent Labs  Lab 10/27/18 1414 10/28/18 0012  NA 137 136  K 3.9 4.2  CL 101 103  CO2 20* 20*  GLUCOSE 183* 123*  BUN 13 13  CREATININE 1.15 0.88  CALCIUM 9.6 9.1  PROT 7.3  --   ALBUMIN 4.4  --   AST 29  --   ALT  24  --   ALKPHOS 78  --   BILITOT 0.8  --   GFRNONAA >60 >60  GFRAA >60 >60  ANIONGAP 16* 13     Hematology Recent Labs  Lab 10/27/18 1414  WBC 8.6  RBC 4.32  HGB 13.8  HCT 40.1  MCV 92.8  MCH 31.9  MCHC 34.4  RDW 13.6  PLT 237    BNP Recent Labs  Lab 10/28/18 0012  BNP 298.0*     DDimer No results for input(s): DDIMER in the last 168 hours.   Radiology    Dg Chest Port 1 View  Result Date: 10/27/2018 CLINICAL DATA:  Back pain. EXAM: PORTABLE CHEST 1 VIEW COMPARISON:  None. FINDINGS: Cardiomediastinal silhouette is normal. Mediastinal contours appear intact. Tortuosity and calcific atherosclerotic disease of the aorta. There is no evidence of focal airspace consolidation, pleural effusion or pneumothorax. Osseous structures are without acute abnormality. Soft tissues are grossly normal.  IMPRESSION: No active disease. Electronically Signed   By: Ted Mcalpineobrinka  Dimitrova M.D.   On: 10/27/2018 14:58   Ct Angio Chest Aorta W And/or Wo Contrast  Addendum Date: 10/27/2018   ADDENDUM REPORT: 10/27/2018 20:48 ADDENDUM: Hila additional finding not mentioned in the report. There is a 9 mm right lower lobe calcified lung nodule, either representing large granuloma or a hamartoma. Slight increased density along the distal arch and descending thoracic aorta, felt to be within the lung parenchyma and not related to intramural hematoma or dissection. Electronically Signed   By: Jasmine PangKim  Fujinaga M.D.   On: 10/27/2018 20:48   Result Date: 10/27/2018 CLINICAL DATA:  Back pain history of cardiac surgery EXAM: CT ANGIOGRAPHY CHEST WITH CONTRAST TECHNIQUE: Multidetector CT imaging of the chest was performed using the standard protocol during bolus administration of intravenous contrast. Multiplanar CT image reconstructions and MIPs were obtained to evaluate the vascular anatomy. CONTRAST:  75mL OMNIPAQUE IOHEXOL 350 MG/ML SOLN COMPARISON:  None. FINDINGS: Cardiovascular: Satisfactory opacification of the pulmonary arteries to the segmental level. No evidence of pulmonary embolism. Ectatic ascending aorta up to 3.7 cm. No dissection is seen. Moderate aortic atherosclerosis. Coronary vascular calcification. Mild cardiomegaly. No pericardial effusion. Calcification and moderate narrowing of the right subclavian artery at its origin. Mediastinum/Nodes: No enlarged mediastinal, hilar, or axillary lymph nodes. Thyroid gland, trachea, and esophagus demonstrate no significant findings. Lungs/Pleura: Lungs are clear. No pleural effusion or pneumothorax. Upper Abdomen: No acute abnormality. Musculoskeletal: Degenerative changes. No acute or suspicious osseous abnormality. Review of the MIP images confirms the above findings. IMPRESSION: 1. Negative for acute pulmonary embolus or aortic dissection. 2. No acute pulmonary infiltrate.  Aortic Atherosclerosis (ICD10-I70.0). Electronically Signed: By: Jasmine PangKim  Fujinaga M.D. On: 10/27/2018 20:10    Cardiac Studies   TTE: pending  Patient Profile     73 y.o. male with a history of HTN, hyperlipidemia, mild aortic stenosis, recently diagnosed symptomatic PAD (medically managed), as well as CAD with PCI in 2007 (Cypher to mid RCA, prox RCA, mid LAD) and then in 2017 for progressive angina with overlapping DES for up to 90% ISR of the mid RCA stent. He is being seen today for the evaluation of back pain associated with elevated troponin and TWI on ECG.   Assessment & Plan    1. CAD with possible NSTEMI: Prior remote PCI to RCA and LAD with ISR of mid-RCA treated with overlapping stents in 2017 for worsening angina and has done well since then. Presented with back pain, which was different from this angina with  prior stents. Also with nausea and diaphoresis. Elevated hsT, and TWI in anterior leads which are presumed to be new. Given findings, there is concern for ACS. Plan for cardiac cath today to reassess stents.  -- remains on ASA, plavix, statin, and heparin. Continue to hold BB 2/2 to bradycardia. -- The patient understands that risks included but are not limited to stroke (1 in 1000), death (1 in 1000), kidney failure [usually temporary] (1 in 500), bleeding (1 in 200), allergic reaction [possibly serious] (1 in 200).   2. Acute back pain: seemed musculoskeletal by history. CTA negative for VTE or acute aortic syndrome. There is moderate subclavian stenosis of the subclavian. Significantly improved this morning.   3. Hypertension: remains above goal. Will add amlodpine 5mg  daily.  4. Alcohol Use: Drinks 3-4 glasses of wine per night. Counseled on alcohol use. .   5. PAD: ABIs 0.5-0.6 bilaterally with claudication and waveform c/w aorto-iliac disease. Extensive vascular calcification on prior abdominal imaging from 2015. Non-smoker. No evidence of limb ischemia.  - On DAPT and  high-intensity statin.  - would benefit from outpatient work up at discharge  6. Reported borderline diabetes, mild hyperglycemia: - Hgb A1c 5.5, suspect degree of dehydration on admission.   7. Mild AS: echo 7/19 with mean pressure of 12.636mmHg and valve area of 1.8 cm2  For questions or updates, please contact [facility] Please consult [email or web address] for contact info under    Signed, Laverda PageLindsay Roberts, NP  10/28/2018, 9:03 AM     Addendum: Jeanene Erballed by RN. Patient with blood noted from urethra. Denies any hx of urology issues in the past. Noted that UA was + for Hgb. Not sure if this is a correlation to his back pain on admission? Has been afebrile. Will send off urine culture. Will hold on cath today, and reevaluate in the morning. IV heparin stopped for now.   SignedLaverda Page, Lindsay Roberts, NP-C 10/28/2018, 10:47 AM Pager: [phone]

## 2018-10-28 NOTE — Progress Notes (Addendum)
Blood noted on patient's underwear and comning from urethra. Patient denies any hx of urology issues in the past. RN paged cardiology Mancel Bale, NP and was made aware of issues. Verbal order was give to stop heparin gtt. Mancel Bale, NP came and assessed patient and stated heart cath will not be today. Will continue to monitor closely.

## 2018-10-28 NOTE — Progress Notes (Signed)
  Echocardiogram 2D Echocardiogram has been performed.  Glenn Collins 10/28/2018, 1:52 PM

## 2018-10-29 ENCOUNTER — Telehealth: Payer: Self-pay

## 2018-10-29 DIAGNOSIS — I2510 Atherosclerotic heart disease of native coronary artery without angina pectoris: Secondary | ICD-10-CM

## 2018-10-29 DIAGNOSIS — I214 Non-ST elevation (NSTEMI) myocardial infarction: Secondary | ICD-10-CM

## 2018-10-29 LAB — CBC
HCT: 36.7 % — ABNORMAL LOW (ref 39.0–52.0)
Hemoglobin: 12.3 g/dL — ABNORMAL LOW (ref 13.0–17.0)
MCH: 31.6 pg (ref 26.0–34.0)
MCHC: 33.5 g/dL (ref 30.0–36.0)
MCV: 94.3 fL (ref 80.0–100.0)
Platelets: 200 10*3/uL (ref 150–400)
RBC: 3.89 MIL/uL — ABNORMAL LOW (ref 4.22–5.81)
RDW: 14.1 % (ref 11.5–15.5)
WBC: 8.5 10*3/uL (ref 4.0–10.5)
nRBC: 0 % (ref 0.0–0.2)

## 2018-10-29 LAB — GLUCOSE, CAPILLARY
Glucose-Capillary: 102 mg/dL — ABNORMAL HIGH (ref 70–99)
Glucose-Capillary: 105 mg/dL — ABNORMAL HIGH (ref 70–99)
Glucose-Capillary: 106 mg/dL — ABNORMAL HIGH (ref 70–99)
Glucose-Capillary: 92 mg/dL (ref 70–99)

## 2018-10-29 LAB — URINE CULTURE: Culture: NO GROWTH

## 2018-10-29 SURGERY — LEFT HEART CATH AND CORONARY ANGIOGRAPHY
Anesthesia: LOCAL

## 2018-10-29 MED ORDER — METOPROLOL SUCCINATE ER 25 MG PO TB24: 25.0000 mg | ORAL_TABLET | Freq: Every day | ORAL | 2 refills | Status: DC

## 2018-10-29 MED ORDER — SODIUM CHLORIDE 0.9 % WEIGHT BASED INFUSION
3.0000 mL/kg/h | INTRAVENOUS | Status: AC
  Administered 2018-10-29: 3 mL/kg/h via INTRAVENOUS

## 2018-10-29 MED ORDER — NITROGLYCERIN 0.4 MG SL SUBL: 0.4000 mg | SUBLINGUAL_TABLET | SUBLINGUAL | 1 refills | Status: AC | PRN

## 2018-10-29 MED ORDER — SODIUM CHLORIDE 0.9 % WEIGHT BASED INFUSION: 1.0000 mL/kg/h | INTRAVENOUS | Status: DC

## 2018-10-29 MED ORDER — METOPROLOL TARTRATE 12.5 MG HALF TABLET
12.5000 mg | ORAL_TABLET | Freq: Two times a day (BID) | ORAL | Status: DC
  Administered 2018-10-29: 12.5 mg via ORAL
  Filled 2018-10-29: qty 1

## 2018-10-29 NOTE — Telephone Encounter (Signed)
**Note De-identified  Obfuscation** The pt is being discharged from the hospital today. We will call tomorrow. 

## 2018-10-29 NOTE — Progress Notes (Signed)
Patient ambulated down entire hall with no complaints. Stated he felt great and much better than yesterday. HR remained between 90-110.No signs of distress noted.

## 2018-10-29 NOTE — Discharge Summary (Addendum)
The patient has been seen in conjunction with Glenn Bellis, NP. All aspects of care have been considered and discussed. The patient has been personally interviewed, examined, and all clinical data has been reviewed.   Please see my comments from earlier relative to this discharge.  Plan as outlined.  Discharge Summary    Patient ID: Glenn Collins,  MRN: [id number], DOB/AGE: 1946/03/18 73 y.o.  Admit date: 10/27/2018 Discharge date: 10/29/2018  Primary Care Provider: Patient, No Pcp Per Primary Cardiologist: No primary care provider on file.  Discharge Diagnoses    Principal Problem:   NSTEMI (non-ST elevated myocardial infarction) Active Problems:   Acute back pain   PAD (peripheral artery disease) (HCC)   Hypertension   CAD (coronary artery disease)   Allergies No Known Allergies  Diagnostic Studies/Procedures    TTE: 10/28/18  IMPRESSIONS    1. The left ventricle has normal systolic function with an ejection fraction of 60-65%. The cavity size was normal. Left ventricular diastolic Doppler parameters are consistent with impaired relaxation. No evidence of left ventricular regional wall  motion abnormalities.  2. The right ventricle has normal systolic function. The cavity was mildly enlarged. There is no increase in right ventricular wall thickness.  3. There is mild mitral annular calcification present. No evidence of mitral valve stenosis. Trivial mitral regurgitation.  4. The aortic valve is tricuspid. Moderate calcification of the aortic valve. Aortic valve regurgitation is trivial by color flow Doppler. Mild stenosis of the aortic valve. Mean gradient 11 mmHg with AVA 1.59 cm^2.  5. The aortic root is normal in size and structure.  6. The IVC is normal in size. No complete TR doppler jet so unable to estimate PA systolic pressure. _____________   History of Present Illness     Glenn Collins is a 73 y.o. male with a history of HTN, hyperlipidemia, mild  aortic stenosis, recently diagnosed symptomatic PAD (medically managed), as well as CAD with PCI in 2007 (Cypher to mid RCA, prox RCA, mid LAD) and then in 2017 for progressive angina with overlapping DES for up to 90% ISR of the mid RCA stent. He is being seen today for the evaluation of back pain associated with elevated troponin and TWI on ECG.   While walking to his kitchen shortly after waking up the morning of admission he described sudden onset pain below his right scapula around 9am that felt like fairly severe muscle spasms. He thought there may have been some minimal improvement initially with a heating pad, but the pain became worse to a constant 10/10 that came around his right side and was associated with nausea and "cold sweats" by around 1pm. He had much more mild "back spasms" like this before but not for a long time and nothing that severe. He denied any associated dyspnea, stroke-like symptoms, or actual chest pain. This did not feel like his prior angina which was exertional substernal burning radiating to his arms and neck. He still tried to walk every day, but had been limited more recently by angina and frequently had to stop to rest. Given these limitations, he did not have any angina. He had not missed any of his medications.  In the ED he was bradycardic to the 40s-50s and hypertensive on arrival. ECG showed SR with anterior TWI not mentioned on prior ECG reads from [place]. HS troponin increased from 8 to 51. CTA was negative for dissection or PE - there was an area hypodense area surrounding the lateral  edge of the descending aorta that was reviewed by multiple radiologists, including IR, as well as CT surgery, and all agreed not consistent with dissection. He was given dilaudid and Ativan with marked improvement, currently 3-4/10. Given symptoms he was admitted for further work up and placed on IV heparin.   Hospital Course     1.CAD/back/shoulder pain:Prior remote PCI to RCA  and LAD with ISR of mid-RCA treated with overlapping stents in 2017 for worsening angina and has done well since then.Presented with back pain, which was different from this angina with prior stents. Also with nausea and diaphoresis. Elevated but flat hsT, and TWI in anterior leads which are presumed to be new. No further episodes of pain. EKG without serial changes. Heparin stopped 2/2 to bleeding/hematuria. Was able to ambulate without recurrent symptoms.  -- remains on ASA, plavix, statin, BB and ACE.   2.Acute back pain/scapula pain:seemedmusculoskeletal by history.CTA negative for VTE or acute aortic syndrome. There is moderate subclavian stenosis of the subclavian. Significantly improved with pain meds.  3.Hypertension: elevated during admission, but BB had been held. Will add back Toprol at discharge as heart rates are in the 70-80s.  -- continue ACE  4.Alcohol RUE:AVWUJWse:Drinks 3-4 glasses of wine per night. Counseled on alcohol use. Marland Kitchen.   5.PAD: ABIs 0.5-0.6 bilaterally with claudication and waveform c/w aorto-iliac disease. Extensive vascular calcification on prior abdominal imaging from 2015. Non-smoker. No evidence of limb ischemia.  - On DAPT and high-intensity statin.  -would benefit from outpatient work up in follow up.  6.Reported borderline diabetes, mild hyperglycemia: -Hgb A1c 5.5, suspect degree of dehydration on admission.   7. Mild AS: echo 7/19 with mean pressure of 12.396mmHg and valve area of 1.8 cm2  8. Hematuria: developed short episode which resolved with stopping of heparin. H/H stable. PSA slightly elevated. Renal US was negative. Etiology unknown.  -- plan for outpatient urology follow up discussed with the patient.   Glenn Collins was seen by Dr. Katrinka BlazingSmith and determined stable for discharge home. Follow up in the office has been arranged. Medications are listed below.   _____________  Discharge Vitals Blood pressure (!) 170/77, pulse 65, temperature  98.4 F (36.9 C), temperature source Oral, resp. rate 20, height 6' (1.829 m), weight 90.7 kg, SpO2 98 %.  Filed Weights   10/28/18 0450 10/28/18 0948 10/29/18 0519  Weight: 91.4 kg 91.4 kg 90.7 kg    Labs & Radiologic Studies    CBC Recent Labs    10/27/18 1414 10/29/18 0711  WBC 8.6 8.5  HGB 13.8 12.3*  HCT 40.1 36.7*  MCV 92.8 94.3  PLT 237 200   Basic Metabolic Panel Recent Labs    11/91/4706/04/05 1414 10/28/18 0012  NA 137 136  K 3.9 4.2  CL 101 103  CO2 20* 20*  GLUCOSE 183* 123*  BUN 13 13  CREATININE 1.15 0.88  CALCIUM 9.6 9.1  MG  --  2.3   Liver Function Tests Recent Labs    10/27/18 1414  AST 29  ALT 24  ALKPHOS 78  BILITOT 0.8  PROT 7.3  ALBUMIN 4.4   Recent Labs    10/27/18 1414  LIPASE 29   Cardiac Enzymes No results for input(s): CKTOTAL, CKMB, CKMBINDEX, TROPONINI in the last 72 hours. BNP Invalid input(s): POCBNP D-Dimer No results for input(s): DDIMER in the last 72 hours. Hemoglobin A1C Recent Labs    10/28/18 0012  HGBA1C 5.5   Fasting Lipid Panel No results for input(s): CHOL, HDL,  LDLCALC, TRIG, CHOLHDL, LDLDIRECT in the last 72 hours. Thyroid Function Tests No results for input(s): TSH, T4TOTAL, T3FREE, THYROIDAB in the last 72 hours.  Invalid input(s): FREET3 _____________  [place] Renal  Result Date: 10/28/2018 CLINICAL DATA:  Hematuria EXAM: RENAL / URINARY TRACT ULTRASOUND COMPLETE COMPARISON:  None. FINDINGS: Right Kidney: Renal measurements: 11.7 x 5.2 x 4.9 cm. = volume: 157 mL . Echogenicity within normal limits. No mass or hydronephrosis visualized. Left Kidney: Renal measurements: 11.5 x 6.3 x 5.1 cm = volume: 194 mL. Echogenicity within normal limits. No mass or hydronephrosis visualized. Bladder: Appears normal for degree of bladder distention. IMPRESSION: Unremarkable renal ultrasound. Electronically Signed   By: Alcide CleverMark  Lukens M.D.   On: 10/28/2018 17:18   Dg Chest Port 1 View  Result Date: 10/27/2018 CLINICAL DATA:   Back pain. EXAM: PORTABLE CHEST 1 VIEW COMPARISON:  None. FINDINGS: Cardiomediastinal silhouette is normal. Mediastinal contours appear intact. Tortuosity and calcific atherosclerotic disease of the aorta. There is no evidence of focal airspace consolidation, pleural effusion or pneumothorax. Osseous structures are without acute abnormality. Soft tissues are grossly normal. IMPRESSION: No active disease. Electronically Signed   By: Ted Mcalpineobrinka  Dimitrova M.D.   On: 10/27/2018 14:58   Ct Angio Chest Aorta W And/or Wo Contrast  Addendum Date: 10/27/2018   ADDENDUM REPORT: 10/27/2018 20:48 ADDENDUM: Hila additional finding not mentioned in the report. There is a 9 mm right lower lobe calcified lung nodule, either representing large granuloma or a hamartoma. Slight increased density along the distal arch and descending thoracic aorta, felt to be within the lung parenchyma and not related to intramural hematoma or dissection. Electronically Signed   By: Jasmine PangKim  Fujinaga M.D.   On: 10/27/2018 20:48   Result Date: 10/27/2018 CLINICAL DATA:  Back pain history of cardiac surgery EXAM: CT ANGIOGRAPHY CHEST WITH CONTRAST TECHNIQUE: Multidetector CT imaging of the chest was performed using the standard protocol during bolus administration of intravenous contrast. Multiplanar CT image reconstructions and MIPs were obtained to evaluate the vascular anatomy. CONTRAST:  75mL OMNIPAQUE IOHEXOL 350 MG/ML SOLN COMPARISON:  None. FINDINGS: Cardiovascular: Satisfactory opacification of the pulmonary arteries to the segmental level. No evidence of pulmonary embolism. Ectatic ascending aorta up to 3.7 cm. No dissection is seen. Moderate aortic atherosclerosis. Coronary vascular calcification. Mild cardiomegaly. No pericardial effusion. Calcification and moderate narrowing of the right subclavian artery at its origin. Mediastinum/Nodes: No enlarged mediastinal, hilar, or axillary lymph nodes. Thyroid gland, trachea, and esophagus  demonstrate no significant findings. Lungs/Pleura: Lungs are clear. No pleural effusion or pneumothorax. Upper Abdomen: No acute abnormality. Musculoskeletal: Degenerative changes. No acute or suspicious osseous abnormality. Review of the MIP images confirms the above findings. IMPRESSION: 1. Negative for acute pulmonary embolus or aortic dissection. 2. No acute pulmonary infiltrate. Aortic Atherosclerosis (ICD10-I70.0). Electronically Signed: By: Jasmine PangKim  Fujinaga M.D. On: 10/27/2018 20:10   Disposition   Pt is being discharged home today in good condition.  Follow-up Plans & Appointments    Follow-up Information    Primary Care/urology Follow up.   Why: please arrange for outpatient follow up with PCP and urology within the next 2-3 weeks.       Leone BrandIngold, Laura R, NP Follow up on 11/14/2018.   Specialties: Cardiology, Radiology Why: at 1:30pm for your follow up appt.  Contact information: [address] [place] [id number] [phone]          Discharge Instructions    Diet - low sodium heart healthy   Complete  by: As directed    Increase activity slowly   Complete by: As directed      Discharge Medications     Medication List    STOP taking these medications   metoprolol tartrate 25 MG tablet Commonly known as: LOPRESSOR     TAKE these medications   aspirin EC 81 MG tablet Take 81 mg by mouth daily.   atorvastatin 80 MG tablet Commonly known as: LIPITOR Take 80 mg by mouth daily at 6 PM.   clopidogrel 75 MG tablet Commonly known as: PLAVIX Take 75 mg by mouth daily.   diphenhydrAMINE 25 MG tablet Commonly known as: BENADRYL Take 25 mg by mouth at bedtime as needed for sleep.   metoprolol succinate 25 MG 24 hr tablet Commonly known as: Toprol XL Take 1 tablet (25 mg total) by mouth daily.   nitroGLYCERIN 0.4 MG SL tablet Commonly known as: NITROSTAT Place 1 tablet (0.4 mg total) under the tongue every 5 (five) minutes x 3 doses as needed for  chest pain.   ramipril 10 MG capsule Commonly known as: ALTACE Take 10 mg by mouth daily.        Acute coronary syndrome (MI, NSTEMI, STEMI, etc) this admission?: No.     Outstanding Labs/Studies   N/a   Duration of Discharge Encounter   Greater than 30 minutes including physician time.  Signed, Laverda PageLindsay Roberts NP-C 10/29/2018, 11:59 AM

## 2018-10-29 NOTE — Telephone Encounter (Signed)
**Note De-identified  Obfuscation** -----  **Note De-Identified  Obfuscation** Message from Cheryln Manly, NP sent at 10/29/2018 11:57 AM EDT ----- Regarding: TOC call back Needs TOC follow up call. Thx

## 2018-10-29 NOTE — Progress Notes (Addendum)
The patient has been seen in conjunction with Laverda PageLindsay Roberts, NP. All aspects of care have been considered and discussed. The patient has been personally interviewed, examined, and all clinical data has been reviewed.   Blood pressure is not well controlled.  Convert metoprolol tartrate to succinate 25 mg/day.  Further increase ramipril as needed for better blood pressure control.  Will need to have urology consultation because of hematuria.  Has ruled out for myocardial infarction.  No typical symptoms, flat markers, and non-evolutionary changes on EKG.  No immediate cardiac work-up is necessary. Etiology of scapular pain is unknown.  Needs assignment to a cardiologist with 7 to 10-day TOC.  He should see me or 1 of my team members.  Will also need to establish with primary care.  Progress Note  Patient Name: Glenn LeakRichard Croll Date of Encounter: 10/29/2018  Primary Cardiologist: No primary care provider on file.   Subjective   No further bleeding, back/shoulder pain has improved.   Inpatient Medications    Scheduled Meds: . aspirin  81 mg Oral Daily  . atorvastatin  80 mg Oral q1800  . clopidogrel  75 mg Oral Daily  . insulin aspart  0-15 Units Subcutaneous Q4H  . metoprolol tartrate  12.5 mg Oral BID  . ramipril  10 mg Oral Daily   Continuous Infusions: . sodium chloride Stopped (10/29/18 0308)  . sodium chloride 1 mL/kg/hr (10/29/18 0602)   PRN Meds: diphenhydrAMINE, nitroGLYCERIN, ondansetron (ZOFRAN) IV   Vital Signs    Vitals:   10/28/18 1750 10/28/18 2048 10/29/18 0519 10/29/18 0750  BP: (!) 158/67 (!) 152/65 (!) 170/74 (!) 170/77  Pulse: (!) 54 (!) 57 65   Resp:  16 20   Temp: 98.6 F (37 C) 98.2 F (36.8 C) 98.7 F (37.1 C) 98.4 F (36.9 C)  TempSrc: Oral Oral Oral Oral  SpO2: 97% 97% 96% 98%  Weight:   90.7 kg   Height:        Intake/Output Summary (Last 24 hours) at 10/29/2018 0939 Last data filed at 10/29/2018 0800 Gross per 24 hour  Intake  2276.25 ml  Output 2075 ml  Net 201.25 ml   Last 3 Weights 10/29/2018 10/28/2018 10/28/2018  Weight (lbs) 199 lb 15.3 oz 201 lb 8 oz 201 lb 9.6 oz  Weight (kg) 90.7 kg 91.4 kg 91.445 kg      Telemetry    SR with episodes of sinus arrhythmia, Wide complex beats noted - Personally Reviewed  ECG    SR with arrhythmia, nonspecific ST changes in anterior leads - Personally Reviewed  Physical Exam  Pleasant older WM GEN: No acute distress.   Neck: No JVD Cardiac: RRR, + systolic murmur, no rubs, or gallops.  Respiratory: Clear to auscultation bilaterally. GI: Soft, nontender, non-distended  MS: No edema; No deformity. Neuro:  Nonfocal  Psych: Normal affect   Labs    High Sensitivity Troponin:   Recent Labs  Lab 10/27/18 1414 10/27/18 1700 10/28/18 0012 10/28/18 0504  TROPONINIHS 8 51* 90* 97*      Cardiac EnzymesNo results for input(s): TROPONINI in the last 168 hours. No results for input(s): TROPIPOC in the last 168 hours.   Chemistry Recent Labs  Lab 10/27/18 1414 10/28/18 0012  NA 137 136  K 3.9 4.2  CL 101 103  CO2 20* 20*  GLUCOSE 183* 123*  BUN 13 13  CREATININE 1.15 0.88  CALCIUM 9.6 9.1  PROT 7.3  --   ALBUMIN 4.4  --  AST 29  --   ALT 24  --   ALKPHOS 78  --   BILITOT 0.8  --   GFRNONAA >60 >60  GFRAA >60 >60  ANIONGAP 16* 13     Hematology Recent Labs  Lab 10/27/18 1414 10/29/18 0711  WBC 8.6 8.5  RBC 4.32 3.89*  HGB 13.8 12.3*  HCT 40.1 36.7*  MCV 92.8 94.3  MCH 31.9 31.6  MCHC 34.4 33.5  RDW 13.6 14.1  PLT 237 200    BNP Recent Labs  Lab 10/28/18 0012  BNP 298.0*     DDimer No results for input(s): DDIMER in the last 168 hours.   Radiology    [place] Renal  Result Date: 10/28/2018 CLINICAL DATA:  Hematuria EXAM: RENAL / URINARY TRACT ULTRASOUND COMPLETE COMPARISON:  None. FINDINGS: Right Kidney: Renal measurements: 11.7 x 5.2 x 4.9 cm. = volume: 157 mL . Echogenicity within normal limits. No mass or hydronephrosis  visualized. Left Kidney: Renal measurements: 11.5 x 6.3 x 5.1 cm = volume: 194 mL. Echogenicity within normal limits. No mass or hydronephrosis visualized. Bladder: Appears normal for degree of bladder distention. IMPRESSION: Unremarkable renal ultrasound. Electronically Signed   By: Alcide CleverMark  Lukens M.D.   On: 10/28/2018 17:18   Dg Chest Port 1 View  Result Date: 10/27/2018 CLINICAL DATA:  Back pain. EXAM: PORTABLE CHEST 1 VIEW COMPARISON:  None. FINDINGS: Cardiomediastinal silhouette is normal. Mediastinal contours appear intact. Tortuosity and calcific atherosclerotic disease of the aorta. There is no evidence of focal airspace consolidation, pleural effusion or pneumothorax. Osseous structures are without acute abnormality. Soft tissues are grossly normal. IMPRESSION: No active disease. Electronically Signed   By: Ted Mcalpineobrinka  Dimitrova M.D.   On: 10/27/2018 14:58   Ct Angio Chest Aorta W And/or Wo Contrast  Addendum Date: 10/27/2018   ADDENDUM REPORT: 10/27/2018 20:48 ADDENDUM: Hila additional finding not mentioned in the report. There is a 9 mm right lower lobe calcified lung nodule, either representing large granuloma or a hamartoma. Slight increased density along the distal arch and descending thoracic aorta, felt to be within the lung parenchyma and not related to intramural hematoma or dissection. Electronically Signed   By: Jasmine PangKim  Fujinaga M.D.   On: 10/27/2018 20:48   Result Date: 10/27/2018 CLINICAL DATA:  Back pain history of cardiac surgery EXAM: CT ANGIOGRAPHY CHEST WITH CONTRAST TECHNIQUE: Multidetector CT imaging of the chest was performed using the standard protocol during bolus administration of intravenous contrast. Multiplanar CT image reconstructions and MIPs were obtained to evaluate the vascular anatomy. CONTRAST:  75mL OMNIPAQUE IOHEXOL 350 MG/ML SOLN COMPARISON:  None. FINDINGS: Cardiovascular: Satisfactory opacification of the pulmonary arteries to the segmental level. No evidence of  pulmonary embolism. Ectatic ascending aorta up to 3.7 cm. No dissection is seen. Moderate aortic atherosclerosis. Coronary vascular calcification. Mild cardiomegaly. No pericardial effusion. Calcification and moderate narrowing of the right subclavian artery at its origin. Mediastinum/Nodes: No enlarged mediastinal, hilar, or axillary lymph nodes. Thyroid gland, trachea, and esophagus demonstrate no significant findings. Lungs/Pleura: Lungs are clear. No pleural effusion or pneumothorax. Upper Abdomen: No acute abnormality. Musculoskeletal: Degenerative changes. No acute or suspicious osseous abnormality. Review of the MIP images confirms the above findings. IMPRESSION: 1. Negative for acute pulmonary embolus or aortic dissection. 2. No acute pulmonary infiltrate. Aortic Atherosclerosis (ICD10-I70.0). Electronically Signed: By: Jasmine PangKim  Fujinaga M.D. On: 10/27/2018 20:10    Cardiac Studies   TTE: 10/28/18  IMPRESSIONS    1. The left ventricle has normal systolic function with an ejection  fraction of 60-65%. The cavity size was normal. Left ventricular diastolic Doppler parameters are consistent with impaired relaxation. No evidence of left ventricular regional wall  motion abnormalities.  2. The right ventricle has normal systolic function. The cavity was mildly enlarged. There is no increase in right ventricular wall thickness.  3. There is mild mitral annular calcification present. No evidence of mitral valve stenosis. Trivial mitral regurgitation.  4. The aortic valve is tricuspid. Moderate calcification of the aortic valve. Aortic valve regurgitation is trivial by color flow Doppler. Mild stenosis of the aortic valve. Mean gradient 11 mmHg with AVA 1.59 cm^2.  5. The aortic root is normal in size and structure.  6. The IVC is normal in size. No complete TR doppler jet so unable to estimate PA systolic pressure.  Patient Profile     73 y.o. male with a history of HTN, hyperlipidemia, mild aortic  stenosis, recently diagnosed symptomatic PAD (medically managed), as well as CAD with PCI in 2007 (Cypher to mid RCA, prox RCA, mid LAD) and then in 2017 for progressive angina with overlapping DES for up to 90% ISR of the mid RCA stent. He was seen for the evaluation ofback pain associated with elevated troponin and TWI on ECG.   Assessment & Plan    1. CAD/back/shoulder pain: Prior remote PCI to RCA and LAD with ISR of mid-RCA treated with overlapping stents in 2017 for worsening angina and has done well since then. Presented with back pain, which was different from this angina with prior stents. Also with nausea and diaphoresis. Elevated but flat hsT, and TWI in anterior leads which are presumed to be new. No further episodes of pain. EKG without serial changes. Heparin stopped yesterday 2/2 to bleeding. Will have him ambulate today, possible discharge if tolerates well.  -- remains on ASA, plavix, statin, BB and ACE  2. Acute back pain:seemed musculoskeletal by history. CTA negative for VTE or acute aortic syndrome. There is moderate subclavian stenosis of the subclavian. Significantly improved.  3. Hypertension: elevated today. Will add back metoprolol as heart rates are in the 70-80s.  -- continue ACE  4. Alcohol Use: Drinks 3-4 glasses of wine per night. Counseled on alcohol use. .   5. PAD: ABIs 0.5-0.6 bilaterally with claudication and waveform c/w aorto-iliac disease. Extensive vascular calcification on prior abdominal imaging from 2015. Non-smoker. No evidence of limb ischemia.  - On DAPT and high-intensity statin.  - would benefit from outpatient work up at discharge  6. Reported borderline diabetes, mild hyperglycemia: - Hgb A1c 5.5, suspect degree of dehydration on admission.   7. Mild AS: echo 7/19 with mean pressure of 12.74mmHg and valve area of 1.8 cm2  8. Hematuria: developed short episode yesterday which resolved with stopping of heparin. H/H stable. PSA slightly  elevated. Renal US was negative. Question renal stone? But pain was mostly in upper back.   -- plan for outpatient urology follow up discussed with the patient.  For questions or updates, please contact [place] Please consult [email or web address] for contact info under        Signed, Reino Bellis, NP  10/29/2018, 9:39 AM

## 2018-10-31 NOTE — Telephone Encounter (Signed)
Patient contacted regarding discharge from [facility] on 10/29/2018.  Patient understands to follow up with provider Cecilie Kicks, NP on 11/14/2018 at 1:30 at [facility] in [place]. Patient understands discharge instructions? Yes Patient understands medications and regiment? Yes Patient understands to bring all medications to this visit? Yes

## 2018-11-13 ENCOUNTER — Telehealth: Payer: Self-pay

## 2018-11-13 NOTE — Telephone Encounter (Signed)

## 2018-11-13 NOTE — Progress Notes (Signed)
Cardiology Office Note   Date:  11/14/2018   ID:  Glenn Collins, DOB [birth year], MRN [id number]  PCP:  Patient, No Pcp Per  Cardiologist:  Dr. Katrinka BlazingSMith    Chief Complaint  Patient presents with  . Hospitalization Follow-up      History of Present Illness: Glenn LeakRichard Hashman is a 73 y.o. male who presents for post hosp. Pt recently relocated from [place].    He has a history of HTN, hyperlipidemia, mild aortic stenosis, recently diagnosed symptomatic PAD (medically managed), as well as CAD with PCI in 2007 (Cypher to mid RCA, prox RCA, mid LAD) and then in 2017 for progressive angina with overlapping DES for up to 90% ISR of the mid RCA stent.  Admitted for back pain and elevated troponin.   CTA was negative for dissection or PE - there was an area hypodense area surrounding the lateral edge of the descending aorta that was reviewed by multiple radiologists, including IR, as well as CT surgery, and all agreed not consistent with dissection. He was given dilaudid and Ativan.    He developed hematuria and troponins were flat.   BP not well controlled meds adjusted.  Echo with EF 60-65%, some impaired relaxation.  Mild AS  Borderline DM, etoh use 3-4 glasses of wine per night.  Was to see urology  PSA 4.48   Today he feels great, no further pain in back and none in chest.  No further hematuria. His HR is slightly up and looking at tele strips he was arrhythmic in the hospital.  He has no awareness..   He has no yet seen urology. He has PAD and can walk 2000 feet flat and only 200 going up hill he has claudication.  In past he has not wished to have intervention.    Lipids a few years ago were stable.  On HD statin.  Continues on ASA and plavix.     Past Medical History:  Diagnosis Date  . CAD (coronary artery disease) 10/27/2018  . Coronary artery disease   . Hypertension   . PAD (peripheral artery disease) (HCC) 10/27/2018    Past Surgical History:  Procedure Laterality Date  .  CARDIAC SURGERY       Current Outpatient Medications  Medication Sig Dispense Refill  . aspirin EC 81 MG tablet Take 81 mg by mouth daily.    Marland Kitchen. atorvastatin (LIPITOR) 80 MG tablet Take 80 mg by mouth daily at 6 PM.    . clopidogrel (PLAVIX) 75 MG tablet Take 75 mg by mouth daily.    . diphenhydrAMINE (BENADRYL) 25 MG tablet Take 25 mg by mouth at bedtime as needed for sleep.    . metoprolol succinate (TOPROL XL) 25 MG 24 hr tablet Take 1 tablet (25 mg total) by mouth daily. 30 tablet 2  . nitroGLYCERIN (NITROSTAT) 0.4 MG SL tablet Place 1 tablet (0.4 mg total) under the tongue every 5 (five) minutes x 3 doses as needed for chest pain. 25 tablet 1  . ramipril (ALTACE) 10 MG capsule Take 10 mg by mouth daily.     No current facility-administered medications for this visit.     Allergies:   Patient has no known allergies.    Social History:  The patient  reports that he has quit smoking. He has never used smokeless tobacco. He reports current alcohol use. He reports that he does not use drugs.   Family History:  The patient's family history is not on file. father with HTN  ROS:  General:no colds or fevers, no weight changes Skin:no rashes or ulcers HEENT:no blurred vision, no congestion CV:see HPI PUL:see HPI GI:no diarrhea constipation or melena, no indigestion GU:no hematuria, no dysuria MS:no joint pain, no claudication Neuro:no syncope, no lightheadedness Endo:no diabetes, no thyroid disease  Wt Readings from Last 3 Encounters:  11/14/18 200 lb (90.7 kg)  10/29/18 199 lb 15.3 oz (90.7 kg)     PHYSICAL EXAM: VS:  BP 130/82   Pulse (!) 104   Ht 6' (1.829 m)   Wt 200 lb (90.7 kg)   SpO2 95%   BMI 27.12 kg/m  , BMI Body mass index is 27.12 kg/m. General:Pleasant affect, NAD Skin:Warm and dry, brisk capillary refill HEENT:normocephalic, sclera clear, mucus membranes moist Neck:supple, no JVD, no bruits  Heart:S1S2 RRR without murmur, gallup, rub or click  Lungs:clear without rales, rhonchi, or wheezes AVW:UJWJAbd:soft, non tender, + BS, do not palpate liver spleen or masses Ext:no lower ext edema, ?+ pedal pulses, 2+ radial pulses Neuro:alert and oriented, MAE, follows commands, + facial symmetry    EKG:  EKG is NOT ordered today.   Recent Labs: 10/27/2018: ALT 24 10/28/2018: B Natriuretic Peptide 298.0; BUN 13; Creatinine, Ser 0.88; Magnesium 2.3; Potassium 4.2; Sodium 136 10/29/2018: Hemoglobin 12.3; Platelets 200    Lipid Panel No results found for: CHOL, TRIG, HDL, CHOLHDL, VLDL, LDLCALC, LDLDIRECT     Other studies Reviewed: Additional studies/ records that were reviewed today include: . Lower ext dopplers 2019  Component Name Value Ref Range  RIGHT ABI RATIO 0.59   RIGHT TBI RATIO 0.51   LEFT TBI RATIO 0.57   Upper arterial right arm brachial sys max 125 mmHg  Upper arterial left arm brachial sys max 129 mmHg  Other Result Information  This result has an attachment that is not available.  Result Narrative   Right Conclusion: Waveforms are consistent with aorto-iliac disease.  Conclusions: Unable to compress the Right PTA the left PTA and the Left  ATA the pressures are greater than 255 mmHg Patient states he is not a  diabetic. .  Waveforms are consistent with aorto-iliac disease  Status   Echo 10/28/18 IMPRESSIONS    1. The left ventricle has normal systolic function with an ejection fraction of 60-65%. The cavity size was normal. Left ventricular diastolic Doppler parameters are consistent with impaired relaxation. No evidence of left ventricular regional wall  motion abnormalities.  2. The right ventricle has normal systolic function. The cavity was mildly enlarged. There is no increase in right ventricular wall thickness.  3. There is mild mitral annular calcification present. No evidence of mitral valve stenosis. Trivial mitral regurgitation.  4. The aortic valve is tricuspid. Moderate calcification of the  aortic valve. Aortic valve regurgitation is trivial by color flow Doppler. Mild stenosis of the aortic valve. Mean gradient 11 mmHg with AVA 1.59 cm^2.  5. The aortic root is normal in size and structure.  6. The IVC is normal in size. No complete TR doppler jet so unable to estimate PA systolic pressure.  FINDINGS  Left Ventricle: The left ventricle has normal systolic function, with an ejection fraction of 60-65%. The cavity size was normal. There is no increase in left ventricular wall thickness. Left ventricular diastolic Doppler parameters are consistent with  impaired relaxation. No evidence of left ventricular regional wall motion abnormalities..  Right Ventricle: The right ventricle has normal systolic function. The cavity was mildly enlarged. There is no increase in right ventricular wall thickness.  Left Atrium: Left atrial size was normal in size.  Right Atrium: Right atrial size was normal in size.  Interatrial Septum: No atrial level shunt detected by color flow Doppler.  Pericardium: There is no evidence of pericardial effusion.  Mitral Valve: The mitral valve is normal in structure. There is mild mitral annular calcification present. Mitral valve regurgitation is trivial by color flow Doppler. No evidence of mitral valve stenosis.  Tricuspid Valve: The tricuspid valve is normal in structure. Tricuspid valve regurgitation was not visualized by color flow Doppler.  Aortic Valve: The aortic valve is tricuspid Moderate calcification of the aortic valve. Aortic valve regurgitation is trivial by color flow Doppler. There is Mild stenosis of the aortic valve, with a calculated valve area of 1.59 cm.  Pulmonic Valve: The pulmonic valve was normal in structure. Pulmonic valve regurgitation is not visualized by color flow Doppler.  Aorta: The aortic root is normal in size and structure.  Venous: The inferior vena cava is normal in size with greater than 50% respiratory  variability.   ASSESSMENT AND PLAN:  1.  Back pain, resolved, may have passed kidney stone.  No further hematuria.  Will make appt with urology for follow up and prostate exam.    2.  CAD with Cypher to mid RCA, prox RCA, mid LAD) and then in 2017 for progressive angina with overlapping DES for up to 90% ISR of the mid RCA stent, neg MI and no chest pain. On BB, ASA plavix and ACE will continue and follow up with Dr. Tamala Julian in 6 months.  3.  PAD will have pt see Dr. Jenna Luo or Dr. Gwenlyn Found for eval.  Pt prefers medical therapy.   4.  HLD on statin   5.  HTN controlled  Current medicines are reviewed with the patient today.  The patient Has no concerns regarding medicines.  The following changes have been made:  See above Labs/ tests ordered today include:see above  Disposition:   FU:  see above  Signed, Cecilie Kicks, NP  11/14/2018 10:04 PM    [facility] [place], [place], [place] [address] [place], [place] Phone: [phone]; Fax: [phone]

## 2018-11-14 ENCOUNTER — Other Ambulatory Visit: Payer: Self-pay

## 2018-11-14 ENCOUNTER — Encounter: Payer: Self-pay | Admitting: Cardiology

## 2018-11-14 ENCOUNTER — Ambulatory Visit (INDEPENDENT_AMBULATORY_CARE_PROVIDER_SITE_OTHER): Payer: Medicare PPO | Admitting: Cardiology

## 2018-11-14 VITALS — BP 130/82 | HR 104 | Ht 72.0 in | Wt 200.0 lb

## 2018-11-14 DIAGNOSIS — M545 Low back pain, unspecified: Secondary | ICD-10-CM

## 2018-11-14 DIAGNOSIS — I10 Essential (primary) hypertension: Secondary | ICD-10-CM | POA: Diagnosis not present

## 2018-11-14 DIAGNOSIS — E782 Mixed hyperlipidemia: Secondary | ICD-10-CM

## 2018-11-14 DIAGNOSIS — I739 Peripheral vascular disease, unspecified: Secondary | ICD-10-CM | POA: Diagnosis not present

## 2018-11-14 DIAGNOSIS — R319 Hematuria, unspecified: Secondary | ICD-10-CM | POA: Diagnosis not present

## 2018-11-14 NOTE — Patient Instructions (Addendum)
Medication Instructions:  Your physician recommends that you continue on your current medications as directed. Please refer to the Current Medication list given to you today.  If you need a refill on your cardiac medications before your next appointment, please call your pharmacy.   Lab work:NONE ORDERED  TODAY    If you have labs (blood work) drawn today and your tests are completely normal, you will receive your results only by: Marland Kitchen MyChart Message (if you have MyChart) OR . A paper copy in the mail If you have any lab test that is abnormal or we need to change your treatment, we will call you to review the results.  Testing/Procedures: NONE ORDERED  TODAY   Follow-Up: You have been referred to  UROLOGY AND DR BERRY FOR PERIPHERAL  ARTERY  DISEASE  At [facility], you and your health needs are our priority.  As part of our continuing mission to provide you with exceptional heart care, we have created designated Provider Care Teams.  These Care Teams include your primary Cardiologist (physician) and Advanced Practice Providers (APPs -  Physician Assistants and Nurse Practitioners) who all work together to provide you with the care you need, when you need it. You will need a follow up appointment in 6 months.  Please call our office 2 months in advance to schedule this appointment.  You may see Dr. Daneen Schick or one of the following Advanced Practice Providers on your designated Care Team:   Truitt Merle, NP Cecilie Kicks, NP . Kathyrn Drown, NP  Any Other Special Instructions Will Be Listed Below (If Applicable).

## 2018-12-20 ENCOUNTER — Encounter: Payer: Self-pay | Admitting: Cardiovascular Disease

## 2018-12-24 ENCOUNTER — Ambulatory Visit (INDEPENDENT_AMBULATORY_CARE_PROVIDER_SITE_OTHER): Payer: Medicare PPO | Admitting: Cardiovascular Disease

## 2018-12-24 ENCOUNTER — Encounter: Payer: Self-pay | Admitting: Cardiovascular Disease

## 2018-12-24 ENCOUNTER — Other Ambulatory Visit: Payer: Self-pay

## 2018-12-24 DIAGNOSIS — R0989 Other specified symptoms and signs involving the circulatory and respiratory systems: Secondary | ICD-10-CM | POA: Diagnosis not present

## 2018-12-24 DIAGNOSIS — I739 Peripheral vascular disease, unspecified: Secondary | ICD-10-CM | POA: Diagnosis not present

## 2018-12-24 NOTE — Assessment & Plan Note (Signed)
Glenn Collins was referred to me by Molli Hazard, NP for evaluation of symptomatic PAD.  He has a history of CAD status post remote LAD and RCA stenting in 07 with 3 intervention of the RCA because of in-stent restenosis in 2017 in [place].  History of hypertension hyperlipidemia.  He has had bilateral calf claudication with ambulation fairly symmetric for several years.  He apparently had Doppler studies performed in [place] and was told he had "blockages".  His claudication is pretty reproducible especially when walking up a hill.  I am going to get lower extremity arterial Doppler studies to further evaluate.

## 2018-12-24 NOTE — Patient Instructions (Signed)
Medication Instructions:  Your physician recommends that you continue on your current medications as directed. Please refer to the Current Medication list given to you today.  If you need a refill on your cardiac medications before your next appointment, please call your pharmacy.   Lab work: none If you have labs (blood work) drawn today and your tests are completely normal, you will receive your results only by: Marland Kitchen MyChart Message (if you have MyChart) OR . A paper copy in the mail If you have any lab test that is abnormal or we need to change your treatment, we will call you to review the results.  Testing/Procedures: Your physician has requested that you have a carotid duplex. This test is an ultrasound of the carotid arteries in your neck. It looks at blood flow through these arteries that supply the brain with blood. Allow one hour for this exam. There are no restrictions or special instructions. TO BE SCHEDULED   Your physician has requested that you have a lower or upper extremity arterial duplex. This test is an ultrasound of the arteries in the legs or arms. It looks at arterial blood flow in the legs and arms. Allow one hour for Lower and Upper Arterial scans. There are no restrictions or special instructions TO BE SCHEDULED   Your physician has requested that you have an ankle brachial index (ABI). During this test an ultrasound and blood pressure cuff are used to evaluate the arteries that supply the arms and legs with blood. Allow thirty minutes for this exam. There are no restrictions or special instructions. TO BE SCHEDULED    Follow-Up: At [facility], you and your health needs are our priority.  As part of our continuing mission to provide you with exceptional heart care, we have created designated Provider Care Teams.  These Care Teams include your primary Cardiologist (physician) and Advanced Practice Providers (APPs -  Physician Assistants and Nurse Practitioners) who  all work together to provide you with the care you need, when you need it. . You MAY SCHEDULE a follow up appointment AS NEEDED UNLESS YOUR RESULTS ARE ABNORMAL.  You may see Dr. Gwenlyn Found or one of the following Advanced Practice Providers on your designated Care Team:   . Kerin Ransom, PA-C . Daleen Snook Kroeger, PA-C . Sande Rives, PA-C . Almyra Deforest, PA-C . Fabian Sharp, PA-C . Jory Sims, DNP . Rosaria Ferries, PA-C

## 2018-12-24 NOTE — Progress Notes (Signed)
12/24/2018 Glenn Collins   06/10/1945  [id number]  Primary Physician Patient, No Pcp Per Primary Cardiologist: Glenn Harp MD Glenn Collins  HPI:  Glenn Collins is a 73 y.o. 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  He does have a history of CAD status post RCA and LAD stenting in 2007 with 3 intervention in 2017 of the RCA for in-stent restenosis.  History of hypertension and hyperlipidemia.  She never had a heart attack or stroke.  He denies chest pain or shortness of breath.  He smoked remotely but stopped 20 years ago.  He has a history of claudication which is fairly symmetric and lifestyle limiting especially walking up an incline.  He was evaluated in [place] and told he had "blockages".   Current Meds  Medication Sig  . aspirin EC 81 MG tablet Take 81 mg by mouth daily.  Marland Kitchen atorvastatin (LIPITOR) 80 MG tablet Take 80 mg by mouth daily at 6 PM.  . clopidogrel (PLAVIX) 75 MG tablet Take 75 mg by mouth daily.  . diphenhydrAMINE (BENADRYL) 25 MG tablet Take 25 mg by mouth at bedtime as needed for sleep.  . metoprolol succinate (TOPROL XL) 25 MG 24 hr tablet Take 1 tablet (25 mg total) by mouth daily.  . nitroGLYCERIN (NITROSTAT) 0.4 MG SL tablet Place 1 tablet (0.4 mg total) under the tongue every 5 (five) minutes x 3 doses as needed for chest pain.  . ramipril (ALTACE) 10 MG capsule Take 10 mg by mouth daily.     No Known Allergies  Social History   Socioeconomic History  . Marital status: Married    Spouse name: Not on file  . Number of children: Not on file  . Years of education: Not on file  . Highest education level: Not on file  Occupational History  . Not on file  Social Needs  . Financial resource strain: Not on file  . Food insecurity   Worry: Not on file    Inability: Not on file  . Transportation needs    Medical: Not on file    Non-medical: Not on file  Tobacco Use  . Smoking status: Former Research scientist (life sciences)  . Smokeless tobacco: Never Used  Substance and Sexual Activity  . Alcohol use: Yes  . Drug use: Never  . Sexual activity: Not on file  Lifestyle  . Physical activity    Days per week: Not on file    Minutes per session: Not on file  . Stress: Not on file  Relationships  . Social Herbalist on phone: Not on file    Gets together: Not on file    Attends religious service: Not on file    Active member of club or organization: Not on file    Attends meetings of clubs or organizations: Not on file    Relationship status: Not on file  . Intimate partner violence    Fear of current or ex partner: Not on file    Emotionally abused: Not on file    Physically abused: Not on file    Forced sexual activity: Not on file  Other Topics Concern  . Not on file  Social History Narrative  . Not on file     Review of Systems: General: negative for chills,  fever, night sweats or weight changes.  Cardiovascular: negative for chest pain, dyspnea on exertion, edema, orthopnea, palpitations, paroxysmal nocturnal dyspnea or shortness of breath Dermatological: negative for rash Respiratory: negative for cough or wheezing Urologic: negative for hematuria Abdominal: negative for nausea, vomiting, diarrhea, bright red blood per rectum, melena, or hematemesis Neurologic: negative for visual changes, syncope, or dizziness All other systems reviewed and are otherwise negative except as noted above.    Blood pressure (!) 143/88, pulse 96, temperature 97.7 F (36.5 C), height 6' (1.829 m), weight 203 lb (92.1 kg), SpO2 96 %.  General appearance: alert and no distress Neck: no adenopathy, no JVD, supple, symmetrical, trachea midline, thyroid not enlarged, symmetric, no tenderness/mass/nodules and Bilateral carotid bruits  Lungs: clear to auscultation bilaterally Heart: 2/6 systolic ejection murmur at the base consistent with aortic stenosis Extremities: extremities normal, atraumatic, no cyanosis or edema Pulses: Diminished pedal pulses Skin: Skin color, texture, turgor normal. No rashes or lesions Neurologic: Alert and oriented X 3, normal strength and tone. Normal symmetric reflexes. Normal coordination and gait  EKG not performed today  ASSESSMENT AND PLAN:   PAD (peripheral artery disease) [facility](HCC) [name] was referred to me by Glenn ColanderLara Ingold, NP for evaluation of symptomatic PAD.  He has a history of CAD status post remote LAD and RCA stenting in 07 with 3 intervention of the RCA because of in-stent restenosis in 2017 in [place].  History of hypertension hyperlipidemia.  He has had bilateral calf claudication with ambulation fairly symmetric for several years.  He apparently had Doppler studies performed in [place] and was told he had "blockages".  His claudication is pretty reproducible especially when walking up a hill.  I am going to get lower extremity arterial Doppler studies to further evaluate.  Bilateral carotid bruits Patient has bilateral carotid bruits on physical exam.  We will get carotid Doppler studies to rule out carotid artery disease versus transmitted aortic murmur.      Runell GessJonathan J. Samone Guhl MD FACP,FACC,FAHA, [facility] 12/24/2018 2:18 PM

## 2018-12-24 NOTE — Assessment & Plan Note (Signed)
Patient has bilateral carotid bruits on physical exam.  We will get carotid Doppler studies to rule out carotid artery disease versus transmitted aortic murmur.

## 2019-01-06 ENCOUNTER — Encounter (HOSPITAL_COMMUNITY): Payer: Medicare PPO

## 2019-01-07 ENCOUNTER — Encounter (HOSPITAL_COMMUNITY): Payer: Medicare PPO

## 2019-01-07 ENCOUNTER — Ambulatory Visit (HOSPITAL_COMMUNITY)
Admission: RE | Admit: 2019-01-07 | Discharge: 2019-01-07 | Disposition: A | Discharge status: Home / Self Care | Payer: Medicare PPO | Source: Ambulatory Visit | Attending: Cardiovascular Disease | Admitting: Cardiovascular Disease

## 2019-01-07 ENCOUNTER — Other Ambulatory Visit (HOSPITAL_COMMUNITY): Payer: Self-pay | Admitting: Cardiovascular Disease

## 2019-01-07 ENCOUNTER — Other Ambulatory Visit: Payer: Self-pay

## 2019-01-07 ENCOUNTER — Ambulatory Visit (HOSPITAL_BASED_OUTPATIENT_CLINIC_OR_DEPARTMENT_OTHER)
Admission: RE | Admit: 2019-01-07 | Discharge: 2019-01-07 | Disposition: A | Discharge status: Home / Self Care | Payer: Medicare PPO | Source: Ambulatory Visit | Attending: Cardiovascular Disease | Admitting: Cardiovascular Disease

## 2019-01-07 DIAGNOSIS — I739 Peripheral vascular disease, unspecified: Secondary | ICD-10-CM | POA: Insufficient documentation

## 2019-01-07 DIAGNOSIS — R0989 Other specified symptoms and signs involving the circulatory and respiratory systems: Secondary | ICD-10-CM

## 2019-01-07 DIAGNOSIS — G458 Other transient cerebral ischemic attacks and related syndromes: Secondary | ICD-10-CM

## 2019-01-08 ENCOUNTER — Other Ambulatory Visit: Payer: Self-pay | Admitting: *Deleted

## 2019-01-08 DIAGNOSIS — R0989 Other specified symptoms and signs involving the circulatory and respiratory systems: Secondary | ICD-10-CM

## 2019-01-08 DIAGNOSIS — I6523 Occlusion and stenosis of bilateral carotid arteries: Secondary | ICD-10-CM

## 2019-01-09 ENCOUNTER — Telehealth: Payer: Self-pay

## 2019-01-09 NOTE — Telephone Encounter (Signed)
Spoke with pt and advised Dr. Gwenlyn Found seeing pts in the office on 9/25. Pt agreeable to change appt type from virtual to OV and will present to office

## 2019-01-10 ENCOUNTER — Other Ambulatory Visit: Payer: Self-pay

## 2019-01-10 ENCOUNTER — Ambulatory Visit: Payer: Medicare PPO | Admitting: Cardiovascular Disease

## 2019-01-10 ENCOUNTER — Encounter: Payer: Self-pay | Admitting: Cardiovascular Disease

## 2019-01-10 VITALS — BP 138/80 | HR 112 | Ht 72.0 in | Wt 200.4 lb

## 2019-01-10 DIAGNOSIS — I739 Peripheral vascular disease, unspecified: Secondary | ICD-10-CM

## 2019-01-10 MED ORDER — CILOSTAZOL 50 MG PO TABS: 50.0000 mg | ORAL_TABLET | Freq: Two times a day (BID) | ORAL | 3 refills | Status: DC

## 2019-01-10 NOTE — Assessment & Plan Note (Signed)
History of peripheral arterial disease which is somewhat lifestyle limiting with recent Dopplers performed in our office 01/07/2019 revealing a right ABI 0.68 and a left ABI that was noncompressible.  He did have an occluded right SFA, short segment occlusion of the mid left SFA and high-grade disease in the left popliteal artery.  There is no evidence of critical limb ischemia.  He has been exercising without improvement.  I am going to start him on low-dose Pletal, discontinue his Plavix since his last coronary dimension was 3 years ago and see him back in 3 months.  If he said no improvement in his claudication we will entertain the possibility of endovascular therapy for quality of life issues.

## 2019-01-10 NOTE — Progress Notes (Signed)
History of peripheral arterial disease which is somewhat lifestyle limiting with recent Dopplers performed in our office 01/07/2019 revealing a right ABI 0.68 and a left ABI that was noncompressible.  He did have an occluded right SFA, short segment occlusion of the mid left SFA and high-grade disease in the left popliteal artery.  There is no evidence of critical limb ischemia.  He has been exercising without improvement.  I am going to start him on low-dose Pletal, discontinue his Plavix since his last coronary dimension was 3 years ago and see him back in 3 months.  If he said no improvement in his claudication we will entertain the possibility of endovascular therapy for quality of life issues.   Lorretta Harp, M.D., [place], [facility], [place] [address] [place], [place]  [address]  [phone] 01/10/2019 12:18 PM

## 2019-01-10 NOTE — Patient Instructions (Signed)
Medication Instructions:  Your physician has recommended you make the following change in your medication:   STOP TAKING YOUR CLOPIDOGREL (PLAVIX)  START (CILOSTAZOL) PLETAL 50 MG BY MOUTH TWICE A DAY  If you need a refill on your cardiac medications before your next appointment, please call your pharmacy.   Lab work: NONE If you have labs (blood work) drawn today and your tests are completely normal, you will receive your results only by: Marland Kitchen MyChart Message (if you have MyChart) OR . A paper copy in the mail If you have any lab test that is abnormal or we need to change your treatment, we will call you to review the results.  Testing/Procedures: NONE  Follow-Up: At [facility], you and your health needs are our priority.  As part of our continuing mission to provide you with exceptional heart care, we have created designated Provider Care Teams.  These Care Teams include your primary Cardiologist (physician) and Advanced Practice Providers (APPs -  Physician Assistants and Nurse Practitioners) who all work together to provide you with the care you need, when you need it. You will need a follow up appointment in 3 months with Dr. Quay Burow.  Please call our office 2 months in advance to schedule this appointment.

## 2019-01-24 ENCOUNTER — Other Ambulatory Visit: Payer: Self-pay | Admitting: Cardiology

## 2019-04-03 ENCOUNTER — Other Ambulatory Visit: Payer: Self-pay

## 2019-04-03 MED ORDER — ATORVASTATIN CALCIUM 80 MG PO TABS: 80.0000 mg | ORAL_TABLET | Freq: Every day | ORAL | 2 refills | Status: DC

## 2019-04-18 DIAGNOSIS — Z87442 Personal history of urinary calculi: Secondary | ICD-10-CM

## 2019-04-18 HISTORY — DX: Personal history of urinary calculi: Z87.442

## 2019-04-22 ENCOUNTER — Ambulatory Visit: Payer: Medicare PPO | Admitting: Cardiovascular Disease

## 2019-04-22 ENCOUNTER — Encounter: Payer: Self-pay | Admitting: Cardiovascular Disease

## 2019-04-22 ENCOUNTER — Other Ambulatory Visit: Payer: Self-pay

## 2019-04-22 DIAGNOSIS — I739 Peripheral vascular disease, unspecified: Secondary | ICD-10-CM | POA: Diagnosis not present

## 2019-04-22 DIAGNOSIS — E782 Mixed hyperlipidemia: Secondary | ICD-10-CM | POA: Diagnosis not present

## 2019-04-22 DIAGNOSIS — I10 Essential (primary) hypertension: Secondary | ICD-10-CM

## 2019-04-22 DIAGNOSIS — I214 Non-ST elevation (NSTEMI) myocardial infarction: Secondary | ICD-10-CM

## 2019-04-22 DIAGNOSIS — E785 Hyperlipidemia, unspecified: Secondary | ICD-10-CM | POA: Insufficient documentation

## 2019-04-22 DIAGNOSIS — R0989 Other specified symptoms and signs involving the circulatory and respiratory systems: Secondary | ICD-10-CM

## 2019-04-22 MED ORDER — METOPROLOL SUCCINATE ER 50 MG PO TB24: 50.0000 mg | ORAL_TABLET | Freq: Every day | ORAL | 3 refills | Status: DC

## 2019-04-22 NOTE — Assessment & Plan Note (Signed)
History of stent essential hypertension blood pressure measured at 146/80 pulse of 103.  He is on low-dose metoprolol succinate as well as ramipril.  #1 increase his metoprolol from 25 to 50 mg a day.

## 2019-04-22 NOTE — Patient Instructions (Signed)
Medication Instructions:  Increase Metoprolol to 50mg  daily.   If you need a refill on your cardiac medications before your next appointment, please call your pharmacy.   Lab work: Fasting Lipid and Liver profile If you have labs (blood work) drawn today and your tests are completely normal, you will receive your results only by: MyChart Message (if you have MyChart) OR A paper copy in the mail If you have any lab test that is abnormal or we need to change your treatment, we will call you to review the results.  Testing/Procedures: NONE  Follow-Up: At [facility], you and your health needs are our priority.  As part of our continuing mission to provide you with exceptional heart care, we have created designated Provider Care Teams.  These Care Teams include your primary Cardiologist (physician) and Advanced Practice Providers (APPs -  Physician Assistants and Nurse Practitioners) who all work together to provide you with the care you need, when you need it. You may see Dr [facility] or one of the following Advanced Practice Providers on your designated Care Team:    Allyson Sabal, PA-C  [place], [place]  [place], Edd Fabian  Your physician wants you to follow-up in: 6 months with Dr [place]

## 2019-04-22 NOTE — Progress Notes (Signed)
04/22/2019 Glenn Collins   1945/06/01  [id number]  Primary Physician Patient, No Pcp Per Primary Cardiologist: Lorretta Harp MD Renae Gloss  HPI:  Glenn Collins is a 74 y.o. 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 a year ago and was referred by Molli Hazard, NP for evaluation of symptomatic PAD.    I last saw him in the office 01/10/2019.  He does have a history of CAD status post RCA and LAD stenting in 2007 with 3 intervention in 2017 of the RCA for in-stent restenosis.  History of hypertension and hyperlipidemia.  She never had a heart attack or stroke.  He denies chest pain or shortness of breath.  He smoked remotely but stopped 20 years ago.  He has a history of claudication which is fairly symmetric and lifestyle limiting especially walking up an incline.  He was evaluated in [place] and told he had "blockages".  Since I saw him 3 months ago I did begin him on Pletal which has resulted in significant improvement in his claudication symptoms.  He had carotid Dopplers that showed no evidence of ICA stenosis.   Current Meds  Medication Sig  . aspirin EC 81 MG tablet Take 81 mg by mouth daily.  Marland Kitchen atorvastatin (LIPITOR) 80 MG tablet Take 1 tablet (80 mg total) by mouth daily at 6 PM.  . cilostazol (PLETAL) 50 MG tablet Take 1 tablet (50 mg total) by mouth 2 (two) times daily.  . diphenhydrAMINE (BENADRYL) 25 MG tablet Take 25 mg by mouth at bedtime as needed for sleep.  . metoprolol succinate (TOPROL-XL) 25 MG 24 hr tablet TAKE ONE TABLET BY MOUTH DAILY  . nitroGLYCERIN (NITROSTAT) 0.4 MG SL tablet Place 1 tablet (0.4 mg total) under the tongue every 5 (five) minutes x 3 doses as needed for chest pain.  . ramipril (ALTACE) 10 MG capsule Take 10 mg by mouth daily.     No Known Allergies  Social History   Socioeconomic History  . Marital  status: Married    Spouse name: Not on file  . Number of children: Not on file  . Years of education: Not on file  . Highest education level: Not on file  Occupational History  . Not on file  Tobacco Use  . Smoking status: Former Research scientist (life sciences)  . Smokeless tobacco: Never Used  Substance and Sexual Activity  . Alcohol use: Yes  . Drug use: Never  . Sexual activity: Not on file  Other Topics Concern  . Not on file  Social History Narrative  . Not on file   Social Determinants of Health   Financial Resource Strain:   . Difficulty of Paying Living Expenses: Not on file  Food Insecurity:   . Worried About Charity fundraiser in the Last Year: Not on file  . Ran Out of Food in the Last Year: Not on file  Transportation Needs:   . Lack of Transportation (Medical): Not on file  . Lack of Transportation (Non-Medical): Not on file  Physical Activity:   . Days of Exercise per Week: Not on file  . Minutes of Exercise per Session: Not on file  Stress:   . Feeling of Stress : Not on file  Social Connections:   . Frequency of Communication with Friends and Family: Not on file  . Frequency of Social Gatherings with Friends and Family: Not on  file  . Attends Religious Services: Not on file  . Active Member of Clubs or Organizations: Not on file  . Attends Banker Meetings: Not on file  . Marital Status: Not on file  Intimate Partner Violence:   . Fear of Current or Ex-Partner: Not on file  . Emotionally Abused: Not on file  . Physically Abused: Not on file  . Sexually Abused: Not on file     Review of Systems: General: negative for chills, fever, night sweats or weight changes.  Cardiovascular: negative for chest pain, dyspnea on exertion, edema, orthopnea, palpitations, paroxysmal nocturnal dyspnea or shortness of breath Dermatological: negative for rash Respiratory: negative for cough or wheezing Urologic: negative for hematuria Abdominal: negative for nausea, vomiting,  diarrhea, bright red blood per rectum, melena, or hematemesis Neurologic: negative for visual changes, syncope, or dizziness All other systems reviewed and are otherwise negative except as noted above.    Blood pressure (!) 146/80, pulse (!) 103, temperature 97.9 F (36.6 C), height 6' (1.829 m), weight 204 lb (92.5 kg), SpO2 98 %.  General appearance: alert and no distress Neck: no adenopathy, no JVD, supple, symmetrical, trachea midline, thyroid not enlarged, symmetric, no tenderness/mass/nodules and Soft bilateral carotid bruits Lungs: clear to auscultation bilaterally Heart: regular rate and rhythm, S1, S2 normal, no murmur, click, rub or gallop Extremities: extremities normal, atraumatic, no cyanosis or edema Pulses: Diminished pedal pulses bilaterally Skin: Skin color, texture, turgor normal. No rashes or lesions Neurologic: Alert and oriented X 3, normal strength and tone. Normal symmetric reflexes. Normal coordination and gait  EKG not performed today  ASSESSMENT AND PLAN:   PAD (peripheral artery disease) (HCC) History of PAD with Dopplers performed 01/07/2019 revealing a right ABI 0.68 and left of 2.08 (noncompressible) with what appears to be occluded SFAs bilaterally.  I did begin him on Pletal (discontinue his Plavix) which has resulted in moderate improvement in his claudication.  He can walk longer longer distances before becoming symptomatic.  Based on this, we have decided to continue to treat him conservatively.  Hypertension History of stent essential hypertension blood pressure measured at 146/80 pulse of 103.  He is on low-dose metoprolol succinate as well as ramipril.  #1 increase his metoprolol from 25 to 50 mg a day.  Hyperlipidemia History of hyperlipidemia on high-dose atorvastatin.  We will recheck a lipid liver profile  NSTEMI (non-ST elevated myocardial infarction) History of CAD status post RCA and LAD stenting back in 2007 with 3 interventions back in  2017.  He denies chest pain or shortness of breath.  Bilateral carotid bruits History of bilateral carotid bruits with Doppler performed 01/05/2019 showed showing minimal internal carotid artery disease.      Runell Gess MD FACP,FACC,FAHA, [facility] 04/22/2019 3:14 PM

## 2019-04-22 NOTE — Assessment & Plan Note (Signed)
History of CAD status post RCA and LAD stenting back in 2007 with 3 interventions back in 2017.  He denies chest pain or shortness of breath.

## 2019-04-22 NOTE — Assessment & Plan Note (Signed)
History of hyperlipidemia on high-dose atorvastatin.  We will recheck a lipid liver profile

## 2019-04-22 NOTE — Assessment & Plan Note (Signed)
History of PAD with Dopplers performed 01/07/2019 revealing a right ABI 0.68 and left of 2.08 (noncompressible) with what appears to be occluded SFAs bilaterally.  I did begin him on Pletal (discontinue his Plavix) which has resulted in moderate improvement in his claudication.  He can walk longer longer distances before becoming symptomatic.  Based on this, we have decided to continue to treat him conservatively.

## 2019-04-22 NOTE — Assessment & Plan Note (Signed)
History of bilateral carotid bruits with Doppler performed 01/05/2019 showed showing minimal internal carotid artery disease.

## 2019-04-23 ENCOUNTER — Telehealth: Payer: Self-pay | Admitting: Cardiovascular Disease

## 2019-04-23 MED ORDER — METOPROLOL SUCCINATE ER 50 MG PO TB24: 50.0000 mg | ORAL_TABLET | Freq: Every day | ORAL | 3 refills | Status: DC

## 2019-04-23 NOTE — Telephone Encounter (Signed)
Pt c/o medication issue:  1. Name of Medication: metoprolol succinate (TOPROL-XL) 50 MG 24 hr tablet  2. How are you currently taking this medication (dosage and times per day)? As directed  3. Are you having a reaction (difficulty breathing--STAT)? no  4. What is your medication issue? Prescription was sent in for 9 tablets instead of 90. Contacted [facility]  who said I need to get in touch with the nurse who wrote the prescription. She transferred me but no answer.

## 2019-04-23 NOTE — Telephone Encounter (Signed)
Called pharmacy and fixed prescription.

## 2019-04-29 LAB — LIPID PANEL
Chol/HDL Ratio: 2.1 ratio (ref 0.0–5.0)
Cholesterol, Total: 153 mg/dL (ref 100–199)
HDL: 72 mg/dL (ref >39)
LDL Chol Calc (NIH): 71 mg/dL (ref 0–99)
Triglycerides: 47 mg/dL (ref 0–149)
VLDL Cholesterol Cal: 10 mg/dL (ref 5–40)

## 2019-04-29 LAB — HEPATIC FUNCTION PANEL
ALT: 32 IU/L (ref 0–44)
AST: 31 IU/L (ref 0–40)
Albumin: 4.9 g/dL — ABNORMAL HIGH (ref 3.7–4.7)
Alkaline Phosphatase: 84 IU/L (ref 39–117)
Bilirubin Total: 0.7 mg/dL (ref 0.0–1.2)
Bilirubin, Direct: 0.24 mg/dL (ref 0.00–0.40)
Total Protein: 7.1 g/dL (ref 6.0–8.5)

## 2019-05-25 ENCOUNTER — Ambulatory Visit: Payer: Medicare PPO | Attending: Internal Medicine

## 2019-05-25 DIAGNOSIS — Z23 Encounter for immunization: Secondary | ICD-10-CM | POA: Insufficient documentation

## 2019-05-25 NOTE — Progress Notes (Signed)
   Covid-19 Vaccination Clinic  Name:  Kiandre Spagnolo    MRN: [id number] DOB: 12-Nov-1945  05/25/2019  Mr. Fullman was observed post Covid-19 immunization for 15 minutes without incidence. He was provided with Vaccine Information Sheet and instruction to access the V-Safe system.   Mr. Potvin was instructed to call 911 with any severe reactions post vaccine: Marland Kitchen Difficulty breathing  . Swelling of your face and throat  . A fast heartbeat  . A bad rash all over your body  . Dizziness and weakness    Immunizations Administered    Name Date Dose VIS Date Route   Pfizer COVID-19 Vaccine 05/25/2019  5:33 PM 0.3 mL 03/28/2019 Intramuscular   Manufacturer: [employer], [employer]   Lot: [id number]   NDC: [address]-9

## 2019-06-15 ENCOUNTER — Ambulatory Visit: Payer: Medicare PPO

## 2019-06-19 ENCOUNTER — Ambulatory Visit: Payer: Medicare PPO | Attending: Internal Medicine

## 2019-06-19 DIAGNOSIS — Z23 Encounter for immunization: Secondary | ICD-10-CM

## 2019-06-19 NOTE — Progress Notes (Signed)
   Covid-19 Vaccination Clinic  Name:  Glenn Collins    MRN: [id number] DOB: 1945/06/19  06/19/2019  [name] was observed post Covid-19 immunization for 15 minutes without incident. He was provided with Vaccine Information Sheet and instruction to access the V-Safe system.   [name] was instructed to call 911 with any severe reactions post vaccine: Marland Kitchen Difficulty breathing  . Swelling of face and throat  . A fast heartbeat  . A bad rash all over body  . Dizziness and weakness   Immunizations Administered    Name Date Dose VIS Date Route   Pfizer COVID-19 Vaccine 06/19/2019  3:08 PM 0.3 mL 03/28/2019 Intramuscular   Manufacturer: [employer], [employer]   Lot: [id number]   NDC: [address]-7

## 2019-07-05 ENCOUNTER — Other Ambulatory Visit: Payer: Self-pay | Admitting: Cardiovascular Disease

## 2019-07-11 ENCOUNTER — Other Ambulatory Visit: Payer: Self-pay | Admitting: Cardiovascular Disease

## 2019-09-07 ENCOUNTER — Inpatient Hospital Stay (HOSPITAL_COMMUNITY)
Admission: EM | Admit: 2019-09-07 | Discharge: 2019-09-10 | DRG: 312 | Disposition: A | Discharge status: Home / Self Care | Payer: Medicare PPO | Attending: Family Medicine | Admitting: Family Medicine

## 2019-09-07 DIAGNOSIS — I2510 Atherosclerotic heart disease of native coronary artery without angina pectoris: Secondary | ICD-10-CM | POA: Diagnosis present

## 2019-09-07 DIAGNOSIS — R55 Syncope and collapse: Secondary | ICD-10-CM | POA: Diagnosis not present

## 2019-09-07 DIAGNOSIS — Z20822 Contact with and (suspected) exposure to covid-19: Secondary | ICD-10-CM | POA: Diagnosis present

## 2019-09-07 DIAGNOSIS — I447 Left bundle-branch block, unspecified: Secondary | ICD-10-CM | POA: Diagnosis present

## 2019-09-07 DIAGNOSIS — Z9861 Coronary angioplasty status: Secondary | ICD-10-CM

## 2019-09-07 DIAGNOSIS — Z7982 Long term (current) use of aspirin: Secondary | ICD-10-CM

## 2019-09-07 DIAGNOSIS — Z87891 Personal history of nicotine dependence: Secondary | ICD-10-CM

## 2019-09-07 DIAGNOSIS — I10 Essential (primary) hypertension: Secondary | ICD-10-CM | POA: Diagnosis present

## 2019-09-07 DIAGNOSIS — E785 Hyperlipidemia, unspecified: Secondary | ICD-10-CM | POA: Diagnosis present

## 2019-09-07 DIAGNOSIS — Z79899 Other long term (current) drug therapy: Secondary | ICD-10-CM

## 2019-09-07 DIAGNOSIS — I252 Old myocardial infarction: Secondary | ICD-10-CM

## 2019-09-07 DIAGNOSIS — I739 Peripheral vascular disease, unspecified: Secondary | ICD-10-CM | POA: Diagnosis present

## 2019-09-07 DIAGNOSIS — R42 Dizziness and giddiness: Secondary | ICD-10-CM | POA: Diagnosis present

## 2019-09-07 DIAGNOSIS — R778 Other specified abnormalities of plasma proteins: Secondary | ICD-10-CM

## 2019-09-08 ENCOUNTER — Other Ambulatory Visit: Payer: Self-pay

## 2019-09-08 ENCOUNTER — Encounter (HOSPITAL_COMMUNITY): Payer: Self-pay | Admitting: Emergency Medicine

## 2019-09-08 ENCOUNTER — Observation Stay (HOSPITAL_BASED_OUTPATIENT_CLINIC_OR_DEPARTMENT_OTHER): Payer: Medicare PPO

## 2019-09-08 ENCOUNTER — Observation Stay (HOSPITAL_COMMUNITY): Payer: Medicare PPO

## 2019-09-08 DIAGNOSIS — I350 Nonrheumatic aortic (valve) stenosis: Secondary | ICD-10-CM | POA: Diagnosis not present

## 2019-09-08 DIAGNOSIS — I4430 Unspecified atrioventricular block: Secondary | ICD-10-CM | POA: Diagnosis not present

## 2019-09-08 DIAGNOSIS — R55 Syncope and collapse: Secondary | ICD-10-CM | POA: Diagnosis not present

## 2019-09-08 DIAGNOSIS — R7989 Other specified abnormal findings of blood chemistry: Secondary | ICD-10-CM | POA: Diagnosis not present

## 2019-09-08 LAB — CBC
HCT: 40 % (ref 39.0–52.0)
Hemoglobin: 13.9 g/dL (ref 13.0–17.0)
MCH: 32 pg (ref 26.0–34.0)
MCHC: 34.8 g/dL (ref 30.0–36.0)
MCV: 92.2 fL (ref 80.0–100.0)
Platelets: 192 10*3/uL (ref 150–400)
RBC: 4.34 MIL/uL (ref 4.22–5.81)
RDW: 13.5 % (ref 11.5–15.5)
WBC: 7.6 10*3/uL (ref 4.0–10.5)
nRBC: 0 % (ref 0.0–0.2)

## 2019-09-08 LAB — CBG MONITORING, ED
Glucose-Capillary: 111 mg/dL — ABNORMAL HIGH (ref 70–99)
Glucose-Capillary: 103 mg/dL — ABNORMAL HIGH (ref 70–99)

## 2019-09-08 LAB — BASIC METABOLIC PANEL
Anion gap: 14 (ref 5–15)
BUN: 13 mg/dL (ref 8–23)
CO2: 25 mmol/L (ref 22–32)
Calcium: 10 mg/dL (ref 8.9–10.3)
Chloride: 96 mmol/L — ABNORMAL LOW (ref 98–111)
Creatinine, Ser: 0.98 mg/dL (ref 0.61–1.24)
GFR calc Af Amer: >60 mL/min (ref >60)
GFR calc non Af Amer: >60 mL/min (ref >60)
Glucose, Bld: 109 mg/dL — ABNORMAL HIGH (ref 70–99)
Potassium: 4.2 mmol/L (ref 3.5–5.1)
Sodium: 135 mmol/L (ref 135–145)

## 2019-09-08 LAB — ECHOCARDIOGRAM COMPLETE
Height: 72 in
Weight: 3280 oz

## 2019-09-08 LAB — URINALYSIS, ROUTINE W REFLEX MICROSCOPIC
Bilirubin Urine: NEGATIVE
Glucose, UA: NEGATIVE mg/dL
Hgb urine dipstick: NEGATIVE
Ketones, ur: 5 mg/dL — AB
Leukocytes,Ua: NEGATIVE
Nitrite: NEGATIVE
Protein, ur: NEGATIVE mg/dL
Specific Gravity, Urine: 1.004 — ABNORMAL LOW (ref 1.005–1.030)
pH: 5 (ref 5.0–8.0)

## 2019-09-08 LAB — SARS CORONAVIRUS 2 BY RT PCR (HOSPITAL ORDER, PERFORMED IN ~~LOC~~ HOSPITAL LAB): SARS Coronavirus 2: NEGATIVE

## 2019-09-08 LAB — TROPONIN I (HIGH SENSITIVITY)
Troponin I (High Sensitivity): 18 ng/L — ABNORMAL HIGH (ref <18)
Troponin I (High Sensitivity): 44 ng/L — ABNORMAL HIGH (ref <18)
Troponin I (High Sensitivity): 42 ng/L — ABNORMAL HIGH (ref <18)

## 2019-09-08 LAB — MAGNESIUM: Magnesium: 2.1 mg/dL (ref 1.7–2.4)

## 2019-09-08 LAB — D-DIMER, QUANTITATIVE: D-Dimer, Quant: 0.77 ug/mL-FEU — ABNORMAL HIGH (ref 0.00–0.50)

## 2019-09-08 MED ORDER — ENOXAPARIN SODIUM 40 MG/0.4ML ~~LOC~~ SOLN
40.0000 mg | SUBCUTANEOUS | Status: DC
  Administered 2019-09-08 – 2019-09-09 (×2): 40 mg via SUBCUTANEOUS
  Filled 2019-09-08 (×2): qty 0.4

## 2019-09-08 MED ORDER — SODIUM CHLORIDE 0.9% FLUSH
3.0000 mL | Freq: Once | INTRAVENOUS | Status: AC
  Administered 2019-09-08: 3 mL via INTRAVENOUS

## 2019-09-08 MED ORDER — ACETAMINOPHEN 325 MG PO TABS: 650.0000 mg | ORAL_TABLET | Freq: Four times a day (QID) | ORAL | Status: DC | PRN

## 2019-09-08 MED ORDER — ACETAMINOPHEN 650 MG RE SUPP: 650.0000 mg | Freq: Four times a day (QID) | RECTAL | Status: DC | PRN

## 2019-09-08 MED ORDER — ASPIRIN EC 81 MG PO TBEC: 81.0000 mg | DELAYED_RELEASE_TABLET | Freq: Every day | ORAL | Status: DC

## 2019-09-08 MED ORDER — ASPIRIN EC 81 MG PO TBEC
81.0000 mg | DELAYED_RELEASE_TABLET | Freq: Every day | ORAL | Status: DC
  Administered 2019-09-09 – 2019-09-10 (×2): 81 mg via ORAL
  Filled 2019-09-08 (×2): qty 1

## 2019-09-08 MED ORDER — ENOXAPARIN SODIUM 40 MG/0.4ML ~~LOC~~ SOLN
40.0000 mg | SUBCUTANEOUS | Status: DC
Start: 2019-09-08 — End: 2019-09-08

## 2019-09-08 MED ORDER — SALINE SPRAY 0.65 % NA SOLN
1.0000 | NASAL | Status: DC | PRN
  Filled 2019-09-08: qty 44

## 2019-09-08 MED ORDER — LORATADINE 10 MG PO TABS
10.0000 mg | ORAL_TABLET | Freq: Every day | ORAL | Status: DC
  Administered 2019-09-08 – 2019-09-10 (×3): 10 mg via ORAL
  Filled 2019-09-08 (×3): qty 1

## 2019-09-08 MED ORDER — CENTRUM SILVER 50+MEN PO TABS: 1.0000 | ORAL_TABLET | Freq: Every day | ORAL | Status: DC

## 2019-09-08 MED ORDER — ADULT MULTIVITAMIN W/MINERALS CH
1.0000 | ORAL_TABLET | Freq: Every day | ORAL | Status: DC
  Administered 2019-09-08 – 2019-09-10 (×3): 1 via ORAL
  Filled 2019-09-08 (×3): qty 1

## 2019-09-08 MED ORDER — CILOSTAZOL 50 MG PO TABS
50.0000 mg | ORAL_TABLET | Freq: Two times a day (BID) | ORAL | Status: DC
  Administered 2019-09-08 – 2019-09-10 (×4): 50 mg via ORAL
  Filled 2019-09-08 (×5): qty 1

## 2019-09-08 MED ORDER — ATORVASTATIN CALCIUM 80 MG PO TABS
80.0000 mg | ORAL_TABLET | Freq: Every day | ORAL | Status: DC
  Administered 2019-09-08 – 2019-09-09 (×2): 80 mg via ORAL
  Filled 2019-09-08 (×2): qty 1

## 2019-09-08 MED ORDER — SODIUM CHLORIDE 0.9% FLUSH
3.0000 mL | Freq: Two times a day (BID) | INTRAVENOUS | Status: DC
  Administered 2019-09-08 – 2019-09-10 (×4): 3 mL via INTRAVENOUS

## 2019-09-08 MED ORDER — CILOSTAZOL 50 MG PO TABS: 50.0000 mg | ORAL_TABLET | Freq: Two times a day (BID) | ORAL | Status: DC

## 2019-09-08 MED ORDER — IOHEXOL 350 MG/ML SOLN
100.0000 mL | Freq: Once | INTRAVENOUS | Status: AC | PRN
  Administered 2019-09-08: 53 mL via INTRAVENOUS

## 2019-09-08 NOTE — H&P (Addendum)
History and Physical    Glenn Collins AST:[id number] DOB: 1945-05-10 DOA: 09/07/2019  PCP: Patient, No Pcp Per Patient coming from: Home  Chief Complaint: Syncope  HPI: Glenn Collins is a 74 y.o. male with medical history significant of CAD status post RCA and LAD stenting in 2007 and 3 interventions back 2017, hypertension, hyperlipidemia, PAD presenting with complaints of syncope x2. Patient states 5 days ago he was out on a walk with his wife and all of a sudden blacked out.  No preceding lightheadedness/dizziness, chest pain, palpitations, or shortness of breath.  He was told that he was unconscious for a few seconds and after waking felt slightly short of breath.  His watch told him that his pulse was 50.  He then went home and felt okay for the next few days.  Today he was feeling dizzy all day.  Around dinnertime this worsened and he felt a little nauseous.  He then went to lay down in the bed and soon after while still talking to his wife passed out again.  He believes he was unconscious just for a few minutes again.  When EMS arrived they checked his heart rate and he was told that it was going anywhere between 50s to 90s.  Reports history of coronary artery disease but denies any prior history of heart rhythm problem.  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  Denies history of blood clots.  Denies history of seizures.  Denies fevers, chills, or recent illness.  Reports having a chronic cough due to postnasal drip.  Denies vomiting, abdominal pain, diarrhea, or dysuria.  He has been vaccinated for Covid.  ED Course: Rhythm strip from EMS at bedside showing heart rate ranging from 40s to 90s.  Afebrile.  Heart rate in the 80-100 range in the ED.  Not hypotensive.  Labs showing no leukocytosis.  Initial high-sensitivity troponin 18, repeat 44.  UA not suggestive of infection.  SARS-CoV-2 PCR test pending.  No imaging done and no medications/fluids administered.  Review of Systems:  All  systems reviewed and apart from history of presenting illness, are negative.  Past Medical History:  Diagnosis Date  . CAD (coronary artery disease) 10/27/2018  . Coronary artery disease   . Hypertension   . PAD (peripheral artery disease) (HCC) 10/27/2018    Past Surgical History:  Procedure Laterality Date  . CARDIAC SURGERY       reports that he has quit smoking. He has never used smokeless tobacco. He reports current alcohol use. He reports that he does not use drugs.  No Known Allergies  History reviewed. No pertinent family history.  Prior to Admission medications   Medication Sig Start Date End Date Taking? Authorizing Provider  aspirin EC 81 MG tablet Take 81 mg by mouth daily.    [provider]  atorvastatin (LIPITOR) 80 MG tablet Take 1 tablet (80 mg total) by mouth daily at 6 PM. 04/03/19   Arty Baumgartner, NP  cilostazol (PLETAL) 50 MG tablet TAKE ONE TABLET BY MOUTH TWICE A DAY 07/08/19   Runell Gess, MD  diphenhydrAMINE (BENADRYL) 25 MG tablet Take 25 mg by mouth at bedtime as needed for sleep.    [provider]  metoprolol succinate (TOPROL-XL) 50 MG 24 hr tablet Take 1 tablet (50 mg total) by mouth daily. 04/23/19   Runell Gess, MD  nitroGLYCERIN (NITROSTAT) 0.4 MG SL tablet Place 1 tablet (0.4 mg total) under the tongue every 5 (five) minutes x 3 doses  as needed for chest pain. 10/29/18   Arty Baumgartner, NP  ramipril (ALTACE) 10 MG capsule Take 10 mg by mouth daily.    [provider]    Physical Exam: Vitals:   09/08/19 0330 09/08/19 0345 09/08/19 0400 09/08/19 0415  BP: 127/86 130/77 135/87 120/82  Pulse: 83 83 83 83  Resp: (!) 9 10 12 17   Temp:      TempSrc:      SpO2: 94% 94% 95% 94%  Weight:      Height:        Physical Exam  Constitutional: He is oriented to person, place, and time. He appears well-developed and well-nourished. No distress.  HENT:  Head: Normocephalic.  Mouth/Throat: Oropharynx is clear  and moist.  Eyes: Right eye exhibits no discharge. Left eye exhibits no discharge.  Cardiovascular: Normal rate, regular rhythm and intact distal pulses.  Murmur heard. Systolic ejection murmur best appreciated at the right upper sternal border  Pulmonary/Chest: Effort normal and breath sounds normal. No respiratory distress. He has no wheezes. He has no rales.  Abdominal: Soft. Bowel sounds are normal. He exhibits no distension. There is no abdominal tenderness. There is no guarding.  Musculoskeletal:        General: No edema.     Cervical back: Neck supple.  Neurological: He is alert and oriented to person, place, and time.  Skin: Skin is warm and dry. He is not diaphoretic.    Labs on Admission: I have personally reviewed following labs and imaging studies  CBC: Recent Labs  Lab 09/08/19 0012  WBC 7.6  HGB 13.9  HCT 40.0  MCV 92.2  PLT 192   Basic Metabolic Panel: Recent Labs  Lab 09/08/19 0012  NA 135  K 4.2  CL 96*  CO2 25  GLUCOSE 109*  BUN 13  CREATININE 0.98  CALCIUM 10.0   GFR: Estimated Creatinine Clearance: 73.7 mL/min (by C-G formula based on SCr of 0.98 mg/dL). Liver Function Tests: No results for input(s): AST, ALT, ALKPHOS, BILITOT, PROT, ALBUMIN in the last 168 hours. No results for input(s): LIPASE, AMYLASE in the last 168 hours. No results for input(s): AMMONIA in the last 168 hours. Coagulation Profile: No results for input(s): INR, PROTIME in the last 168 hours. Cardiac Enzymes: No results for input(s): CKTOTAL, CKMB, CKMBINDEX, TROPONINI in the last 168 hours. BNP (last 3 results) No results for input(s): PROBNP in the last 8760 hours. HbA1C: No results for input(s): HGBA1C in the last 72 hours. CBG: Recent Labs  Lab 09/08/19 0059  GLUCAP 111*   Lipid Profile: No results for input(s): CHOL, HDL, LDLCALC, TRIG, CHOLHDL, LDLDIRECT in the last 72 hours. Thyroid Function Tests: No results for input(s): TSH, T4TOTAL, FREET4, T3FREE,  THYROIDAB in the last 72 hours. Anemia Panel: No results for input(s): VITAMINB12, FOLATE, FERRITIN, TIBC, IRON, RETICCTPCT in the last 72 hours. Urine analysis:    Component Value Date/Time   COLORURINE STRAW (A) 09/08/2019 0131   APPEARANCEUR CLEAR 09/08/2019 0131   LABSPEC 1.004 (L) 09/08/2019 0131   PHURINE 5.0 09/08/2019 0131   GLUCOSEU NEGATIVE 09/08/2019 0131   HGBUR NEGATIVE 09/08/2019 0131   BILIRUBINUR NEGATIVE 09/08/2019 0131   KETONESUR 5 (A) 09/08/2019 0131   PROTEINUR NEGATIVE 09/08/2019 0131   NITRITE NEGATIVE 09/08/2019 0131   LEUKOCYTESUR NEGATIVE 09/08/2019 0131    Radiological Exams on Admission: No results found.  EKG: Pending at this time.  Assessment/Plan Principal Problem:   Syncope Active Problems:   PAD (peripheral  artery disease) ([place])   Hypertension   CAD (coronary artery disease)   Hyperlipidemia   Syncope x2: Symptoms concerning for arrhythmia given lack of prodrome.  Concern for possible tachybrady syndrome as rhythm strips from EMS showing heart rate ranging from 40s to 90s.  In the ED, heart rate in the 80-100 range and appears to be sinus rhythm on the monitor. Hemodynamically stable.  It seems an EKG was done in the ED upon patient arrival but it is not available for personal review.  Initial high-sensitivity troponin 18, repeat 44.  ACS less likely as patient denies any chest pain.  Given concern for arrhythmia likely causing syncope, discussed the case with Dr. Vickki Muff who recommended serial EKGs, trending troponin, and reconsulting cardiology in a.m. -Cardiac monitoring.  Stat EKG reordered and continue to do serial EKGs.  Trend troponin.  Order echocardiogram.  PE less likely given no hypoxia, however, troponin is mildly elevated.  Will check D-dimer level, if elevated, CT angiogram to rule out PE.  Check orthostatics.  Will also order chest x-ray and head CT.  Consider obtaining carotid Dopplers and EEG if cardiology feels the patient's  presentation is not related to arrhythmia.  ADDENDUM: EKG showing new left bundle branch block compared to prior tracing from July 2020.  Reassessed patient and he continues to be chest pain-free.  Denies shortness of breath.  Hemodynamically stable.  Troponins only mildly elevated.  Discussed with STEMI cardiologist Dr. Martinique who agrees that ACS is less likely given only mild troponin elevation and no chest pain.  Will continue to trend troponin and obtain echocardiogram as mentioned above.  CAD status post PCI: Hold beta-blocker until orthostatics are done and cardiology evaluation in a.m.  Patient is not due for his next dose until morning.  Hold aspirin until head CT results are back.  Continue Lipitor.  Hypertension: Hold antihypertensives at this time, pending orthostatics  Hyperlipidemia: Continue Lipitor  PAD: Continue Lipitor.  Hold aspirin and cilostazol until head CT results are back.  DVT prophylaxis: Hold anticoagulation for DVT prophylaxis until head CT results are back. Code Status: Full code Family Communication: No family available at this time. Disposition Plan: Status is: Observation  The patient remains OBS appropriate and will d/c before 2 midnights.  Dispo: The patient is from: Home              Anticipated d/c is to: Home              Anticipated d/c date is: 2 days              Patient currently is not medically stable to d/c.  The medical decision making on this patient was of high complexity and the patient is at high risk for clinical deterioration, therefore this is a level 3 visit.  Shela Leff MD [facility]  If 7PM-7AM, please contact night-coverage [email or web address]  09/08/2019, 4:38 AM

## 2019-09-08 NOTE — ED Notes (Signed)
MD made aware of critical lab.

## 2019-09-08 NOTE — Progress Notes (Signed)
  Echocardiogram 2D Echocardiogram has been performed.  Gerda Diss 09/08/2019, 4:56 PM

## 2019-09-08 NOTE — Progress Notes (Addendum)
PROGRESS NOTE    Glenn Collins  TMA:[id number] DOB: 1945-09-30 DOA: 09/07/2019 PCP: Patient, No Pcp Per   Brief Narrative: 73 year old medical history significant for CAD status post RCA and LAD stenting 2007 and 3 interventions back in 2017, hypertension, hyperlipidemia, PAD who presents after 2 syncope episode.  See HPI for details of episodes.  Patient in the ED: Per EMS rhythm show heart rate ranging from 40s to 90s.  Afebrile.  Heart rate in the ED 80s to 100.  Initial troponin XVIII subsequent 44.-New left bundle branch block.  Assessment & Plan:   Principal Problem:   Syncope Active Problems:   PAD (peripheral artery disease) (HCC)   Hypertension   CAD (coronary artery disease)   Hyperlipidemia   1-Syncope: Concern for  arrhythmia Patient also with a new left bundle branch block, mild elevation of troponin.  Cardiology has been consulted. Discussed with cardiology, if they are not planning to proceed with heart cath,  will proceed with CT angio to rule out PE due to mild elevation D-dimer. Continue to monitor on telemetry. Magnesium level normal. CT Head: No acute intracranial abnormalities. Echo ordered.  2-CAD status post PCI: New bundle branch block.  Cardiology consulted  3-Hypertension: Hold blood pressure medication. Holding Metoprolol due to bradycardia.  4-Hyperlipidemia: Continue Lipitor  5-PAD: Continue Lipitor, resume aspirin and cilostazol.  6-Mild elevation of D-dimer: Doppler lower extremity; report no evidence of DVT.  Estimated body mass index is 27.8 kg/m as calculated from the following:   Height as of this encounter: 6' (1.829 m).   Weight as of this encounter: 93 kg.   DVT prophylaxis:  Code Status: Full code Family Communication: Care discussed with patient Disposition Plan:  Status is: Observation  The patient remains OBS appropriate and will d/c before 2 midnights.  Dispo: The patient is from: Home              Anticipated d/c  is to: Home              Anticipated d/c date is: 1 day              Patient currently is not medically stable to d/c.        Consultants:   Cardiology  Procedures:   Echo  Lower extremity: No evidence of DVT  Antimicrobials:    Subjective: He report two syncope episode, one while walking with his wife, he fell on the grass. No worsening episodes. His HR was low in the 50 and he felt his PVC>. He had another syncope episode while in his bed while talking to his wife. He denies chest pain or dyspnea on exertion.   Objective: Vitals:   09/08/19 1030 09/08/19 1115 09/08/19 1200 09/08/19 1300  BP: 119/79 (!) 140/94 128/90 (!) 136/92  Pulse: 75 95 93 (!) 106  Resp: 15 18 20 16   Temp:      TempSrc:      SpO2: 93% 98% 97% 97%  Weight:      Height:        Intake/Output Summary (Last 24 hours) at 09/08/2019 1427 Last data filed at 09/08/2019 0252 Gross per 24 hour  Intake --  Output 350 ml  Net -350 ml   Filed Weights   09/08/19 0007  Weight: 93 kg    Examination:  General exam: Appears calm and comfortable  Respiratory system: Clear to auscultation. Respiratory effort normal. Cardiovascular system: S1 & S2 heard, RR R. Systolic murmur Gastrointestinal system: Abdomen is nondistended,  soft and nontender. No organomegaly or masses felt. Normal bowel sounds heard. Central nervous system: Alert and oriented.  Extremities: no edema   Data Reviewed: I have personally reviewed following labs and imaging studies  CBC: Recent Labs  Lab 09/08/19 0012  WBC 7.6  HGB 13.9  HCT 40.0  MCV 92.2  PLT 192   Basic Metabolic Panel: Recent Labs  Lab 09/08/19 0012 09/08/19 0942  NA 135  --   K 4.2  --   CL 96*  --   CO2 25  --   GLUCOSE 109*  --   BUN 13  --   CREATININE 0.98  --   CALCIUM 10.0  --   MG  --  2.1   GFR: Estimated Creatinine Clearance: 73.7 mL/min (by C-G formula based on SCr of 0.98 mg/dL). Liver Function Tests: No results for input(s): AST,  ALT, ALKPHOS, BILITOT, PROT, ALBUMIN in the last 168 hours. No results for input(s): LIPASE, AMYLASE in the last 168 hours. No results for input(s): AMMONIA in the last 168 hours. Coagulation Profile: No results for input(s): INR, PROTIME in the last 168 hours. Cardiac Enzymes: No results for input(s): CKTOTAL, CKMB, CKMBINDEX, TROPONINI in the last 168 hours. BNP (last 3 results) No results for input(s): PROBNP in the last 8760 hours. HbA1C: No results for input(s): HGBA1C in the last 72 hours. CBG: Recent Labs  Lab 09/08/19 0059 09/08/19 0530  GLUCAP 111* 103*   Lipid Profile: No results for input(s): CHOL, HDL, LDLCALC, TRIG, CHOLHDL, LDLDIRECT in the last 72 hours. Thyroid Function Tests: No results for input(s): TSH, T4TOTAL, FREET4, T3FREE, THYROIDAB in the last 72 hours. Anemia Panel: No results for input(s): VITAMINB12, FOLATE, FERRITIN, TIBC, IRON, RETICCTPCT in the last 72 hours. Sepsis Labs: No results for input(s): PROCALCITON, LATICACIDVEN in the last 168 hours.  Recent Results (from the past 240 hour(s))  SARS Coronavirus 2 by RT PCR (hospital order, performed in [facility] hospital lab) Nasopharyngeal Nasopharyngeal Swab     Status: None   Collection Time: 09/08/19  2:47 AM   Specimen: Nasopharyngeal Swab  Result Value Ref Range Status   SARS Coronavirus 2 NEGATIVE NEGATIVE Final    Comment: (NOTE) SARS-CoV-2 target nucleic acids are NOT DETECTED. The SARS-CoV-2 RNA is generally detectable in upper and lower respiratory specimens during the acute phase of infection. The lowest concentration of SARS-CoV-2 viral copies this assay can detect is 250 copies / mL. A negative result does not preclude SARS-CoV-2 infection and should not be used as the sole basis for treatment or other patient management decisions.  A negative result may occur with improper specimen collection / handling, submission of specimen other than nasopharyngeal swab, presence of viral  mutation(s) within the areas targeted by this assay, and inadequate number of viral copies (<250 copies / mL). A negative result must be combined with clinical observations, patient history, and epidemiological information. Fact Sheet for Patients:   BoilerBrush.com.cy Fact Sheet for Healthcare Providers: https://pope.com/ This test is not yet approved or cleared  by the [place] FDA and has been authorized for detection and/or diagnosis of SARS-CoV-2 by FDA under an Emergency Use Authorization (EUA).  This EUA will remain in effect (meaning this test can be used) for the duration of the COVID-19 declaration under Section 564(b)(1) of the Act, 21 U.S.C. section 360bbb-3(b)(1), unless the authorization is terminated or revoked sooner. Performed at [facility], [address], [place], [place] [address]  Radiology Studies: X-ray chest PA and lateral  Result Date: 09/08/2019 CLINICAL DATA:  Syncopal episode EXAM: CHEST - 2 VIEW COMPARISON:  Radiograph 09/27/2018, CT 10/27/2018 FINDINGS: Some streaky atelectatic changes are present in the lung bases on background of more chronically coarsened interstitium. No focal consolidative opacity. No pneumothorax or effusion. Normal cardiac size. The aorta is calcified. The remaining cardiomediastinal contours are unremarkable. No acute osseous or soft tissue abnormality. Degenerative changes are present in the imaged spine and shoulders. Telemetry leads overlie the chest. IMPRESSION: Streaky atelectatic changes, otherwise no acute cardiopulmonary abnormality. Aortic Atherosclerosis (ICD10-I70.0). Electronically Signed   By: Kreg Shropshire M.D.   On: 09/08/2019 04:56   CT HEAD WO CONTRAST  Result Date: 09/08/2019 CLINICAL DATA:  Syncope EXAM: CT HEAD WITHOUT CONTRAST TECHNIQUE: Contiguous axial images were obtained from the base of the skull through the vertex without intravenous  contrast. COMPARISON:  None. FINDINGS: Brain: No evidence of acute infarction, hemorrhage, hydrocephalus, extra-axial collection or mass lesion/mass effect. Symmetric prominence of the ventricles, cisterns and sulci compatible with parenchymal volume loss. Patchy areas of white matter hypoattenuation are most compatible with chronic microvascular angiopathy. Vascular: Atherosclerotic calcification of the carotid siphons and intradural vertebral arteries. No hyperdense vessel. Skull: No calvarial fracture or suspicious osseous lesion. No scalp swelling or hematoma. Sinuses/Orbits: Paranasal sinuses and mastoid air cells are predominantly clear. Included orbital structures are unremarkable. Other: Edentulous with dental prostheses. IMPRESSION: 1. No acute intracranial abnormality. 2. Generalized parenchymal volume loss and chronic microvascular ischemic white matter disease. Electronically Signed   By: Kreg Shropshire M.D.   On: 09/08/2019 04:59   VAS [place] LOWER EXTREMITY VENOUS (DVT)  Result Date: 09/08/2019  Lower Venous DVT Study Indications: Syncope, elevated D-dimer.  Risk Factors: History of PAD. Limitations: Shadowing from calcified arteries in bilateral thighs. Comparison Study: No prior study on file for comparison Performing Technologist: Sherren Kerns RVS  Examination Guidelines: A complete evaluation includes B-mode imaging, spectral Doppler, color Doppler, and power Doppler as needed of all accessible portions of each vessel. Bilateral testing is considered an integral part of a complete examination. Limited examinations for reoccurring indications may be performed as noted. The reflux portion of the exam is performed with the patient in reverse Trendelenburg.  +---------+---------------+---------+-----------+----------+--------------+ RIGHT    CompressibilityPhasicitySpontaneityPropertiesThrombus Aging +---------+---------------+---------+-----------+----------+--------------+ CFV      Full            Yes      Yes                                 +---------+---------------+---------+-----------+----------+--------------+ SFJ      Full                                                        +---------+---------------+---------+-----------+----------+--------------+ FV Prox  Full                                                        +---------+---------------+---------+-----------+----------+--------------+ FV Mid   Full                                                        +---------+---------------+---------+-----------+----------+--------------+  FV DistalFull                                                        +---------+---------------+---------+-----------+----------+--------------+ PFV      Full                                                        +---------+---------------+---------+-----------+----------+--------------+ POP      Full           Yes      Yes                                 +---------+---------------+---------+-----------+----------+--------------+ PTV      Full                                                        +---------+---------------+---------+-----------+----------+--------------+ PERO     Full                                                        +---------+---------------+---------+-----------+----------+--------------+   +---------+---------------+---------+-----------+----------+--------------+ LEFT     CompressibilityPhasicitySpontaneityPropertiesThrombus Aging +---------+---------------+---------+-----------+----------+--------------+ CFV      Full           Yes      Yes                                 +---------+---------------+---------+-----------+----------+--------------+ SFJ      Full                                                        +---------+---------------+---------+-----------+----------+--------------+ FV Prox  Full                                                         +---------+---------------+---------+-----------+----------+--------------+ FV Mid   Full                                                        +---------+---------------+---------+-----------+----------+--------------+ FV DistalFull                                                        +---------+---------------+---------+-----------+----------+--------------+   PFV      Full                                                        +---------+---------------+---------+-----------+----------+--------------+ POP      Full           Yes      Yes                                 +---------+---------------+---------+-----------+----------+--------------+ PTV      Full                                                        +---------+---------------+---------+-----------+----------+--------------+ PERO     Full                                                        +---------+---------------+---------+-----------+----------+--------------+     Summary: BILATERAL: - No evidence of deep vein thrombosis seen in the lower extremities, bilaterally. -   *See table(s) above for measurements and observations.    Preliminary         Scheduled Meds: . atorvastatin  80 mg Oral q1800  . loratadine  10 mg Oral Daily  . multivitamin with minerals  1 tablet Oral Daily  . sodium chloride flush  3 mL Intravenous Q12H   Continuous Infusions:   LOS: 0 days    Time spent: 35 minutes.     Elmarie Shiley, MD [facility]   If 7PM-7AM, please contact night-coverage [email or web address]  09/08/2019, 2:27 PM

## 2019-09-08 NOTE — ED Provider Notes (Signed)
[place] EMERGENCY DEPARTMENT Provider Note   CSN: [id number] Arrival date & time: 09/07/19  2358   History Chief Complaint  Patient presents with  . Loss of Consciousness    Glenn Collins is a 74 y.o. male.  The history is provided by the patient.  Loss of Consciousness He has history of hypertension, lipidemia, coronary artery disease, peripheral artery disease and comes in because of a syncopal episode.  He states that he had felt dizzy all day which she attributed to his allergies.  However, tonight he was laying on his bed when he suddenly passed out.  There was no warning that he was going to pass out.  He denies chest pain, heaviness, tightness, pressure.  Denies dyspnea.  Denies nausea or vomiting.  He denies palpitations.  Loss of consciousness was fairly brief but he has no memory of the event.  It was witnessed by his wife.  He did check his heart rate and it was 50 which is very slow for him.  He states his usual heart rate is in the 80s.  He also had a similar episode 5 days ago while he was walking.  Past Medical History:  Diagnosis Date  . CAD (coronary artery disease) 10/27/2018  . Coronary artery disease   . Hypertension   . PAD (peripheral artery disease) ([place]) 10/27/2018    Patient Active Problem List   Diagnosis Date Noted  . Hyperlipidemia 04/22/2019  . Bilateral carotid bruits 12/24/2018  . PAD (peripheral artery disease) ([place]) 10/27/2018  . Hypertension 10/27/2018  . CAD (coronary artery disease) 10/27/2018  . NSTEMI (non-ST elevated myocardial infarction) 10/27/2018  . Acute back pain 10/27/2018    Past Surgical History:  Procedure Laterality Date  . CARDIAC SURGERY         No family history on file.  Social History   Tobacco Use  . Smoking status: Former Research scientist (life sciences)  . Smokeless tobacco: Never Used  Substance Use Topics  . Alcohol use: Yes  . Drug use: Never    Home Medications Prior to Admission medications    Medication Sig Start Date End Date Taking? Authorizing Provider  aspirin EC 81 MG tablet Take 81 mg by mouth daily.    [provider]  atorvastatin (LIPITOR) 80 MG tablet Take 1 tablet (80 mg total) by mouth daily at 6 PM. 04/03/19   Cheryln Manly, NP  cilostazol (PLETAL) 50 MG tablet TAKE ONE TABLET BY MOUTH TWICE A DAY 07/08/19   Lorretta Harp, MD  diphenhydrAMINE (BENADRYL) 25 MG tablet Take 25 mg by mouth at bedtime as needed for sleep.    [provider]  metoprolol succinate (TOPROL-XL) 50 MG 24 hr tablet Take 1 tablet (50 mg total) by mouth daily. 04/23/19   Lorretta Harp, MD  nitroGLYCERIN (NITROSTAT) 0.4 MG SL tablet Place 1 tablet (0.4 mg total) under the tongue every 5 (five) minutes x 3 doses as needed for chest pain. 10/29/18   Cheryln Manly, NP  ramipril (ALTACE) 10 MG capsule Take 10 mg by mouth daily.    [provider]    Allergies    Patient has no known allergies.  Review of Systems   Review of Systems  Cardiovascular: Positive for syncope.  All other systems reviewed and are negative.   Physical Exam Updated Vital Signs BP 130/85   Pulse (!) 106   Temp 97.9 F (36.6 C) (Oral)   Resp 19   Ht 6' (1.829 m)  Wt 93 kg   SpO2 96%   BMI 27.80 kg/m   Physical Exam Vitals and nursing note reviewed.   74 year old male, resting comfortably and in no acute distress. Vital signs are significant for rapid heart rate. Oxygen saturation is 96%, which is normal. Head is normocephalic and atraumatic. PERRLA, EOMI. Oropharynx is clear. Neck is nontender and supple without adenopathy or JVD.  There are no carotid bruits. Back is nontender and there is no CVA tenderness. Lungs are clear without rales, wheezes, or rhonchi. Chest is nontender. Heart has regular rate and rhythm without murmur. Abdomen is soft, flat, nontender without masses or hepatosplenomegaly and peristalsis is normoactive. Extremities have 1+ edema, full range of  motion is present. Skin is warm and dry without rash. Neurologic: Mental status is normal, cranial nerves are intact, there are no motor or sensory deficits.  ED Results / Procedures / Treatments   Labs (all labs ordered are listed, but only abnormal results are displayed) Labs Reviewed  BASIC METABOLIC PANEL - Abnormal; Notable for the following components:      Result Value   Chloride 96 (*)    Glucose, Bld 109 (*)    All other components within normal limits  CBG MONITORING, ED - Abnormal; Notable for the following components:   Glucose-Capillary 111 (*)    All other components within normal limits  TROPONIN I (HIGH SENSITIVITY) - Abnormal; Notable for the following components:   Troponin I (High Sensitivity) 18 (*)    All other components within normal limits  CBC  URINALYSIS, ROUTINE W REFLEX MICROSCOPIC  TROPONIN I (HIGH SENSITIVITY)    EKG Sinus tachycardia 103 bpm.  Normal axis.  First-degree AV block.  Left bundle branch block.  No ST or T wave abnormalities.  When compared with ECG of October 27, 2018, heart rate is increased, incomplete left bundle branch block has progressed to complete left bundle branch block.  Procedures Procedures  Medications Ordered in ED Medications  sodium chloride flush (NS) 0.9 % injection 3 mL (has no administration in time range)    ED Course  I have reviewed the triage vital signs and the nursing notes.  Pertinent lab results that were available during my care of the patient were reviewed by me and considered in my medical decision making (see chart for details).  MDM Rules/Calculators/A&P Syncope with pattern very worrisome for arrhythmia.  Rhythm strips from EMS to show sinus bradycardia but no AV blocks.  Labs show mild elevation of troponin, but significantly less than values from July 2020, repeat troponin pending.  All other labs are normal.  Old records are reviewed confirming he is followed by cardiology for peripheral arterial  disease.  Case is discussed with Dr. Loney Loh of [facility], who agrees to admit the patient.  Final Clinical Impression(s) / ED Diagnoses Final diagnoses:  Syncope, unspecified syncope type  Elevated troponin    Rx / DC Orders ED Discharge Orders    None       Dione Booze, MD 09/08/19 [phone]

## 2019-09-08 NOTE — ED Triage Notes (Signed)
Pt was at home in bed, syncopal episode lasting "a few seconds", witnessed by wife.  EMS reports heart rate between 100-50 w/ LBBB.   Hx of 2 stints.  Pt reports this has happened on Tuesday, fell onto grass.  On blood thinners.  Pt states "my heart rate has been irregular all week."  CBG 95 160/70

## 2019-09-08 NOTE — ED Notes (Signed)
Pt in X ray

## 2019-09-08 NOTE — ED Notes (Signed)
Tele  Breakfast Ordered 

## 2019-09-08 NOTE — ED Notes (Signed)
Pt back from X-ray.  

## 2019-09-08 NOTE — Progress Notes (Signed)
VASCULAR LAB PRELIMINARY  PRELIMINARY  PRELIMINARY  PRELIMINARY  Bilateral lower extremity venous duplex completed.    Preliminary report:  See CV proc for preliminary results.  Chrishun Scheer, RVT 09/08/2019, 11:52 AM

## 2019-09-08 NOTE — ED Notes (Signed)
Attempted report x1. 

## 2019-09-08 NOTE — Consult Note (Addendum)
Cardiology Consultation:   Patient ID: Glenn Collins MRN: [id number]; DOB: 24-May-1945  Admit date: 09/07/2019 Date of Consult: 09/08/2019  Primary Care Provider: Patient, No Pcp Per Primary Cardiologist: Nanetta Batty, MD  Primary Electrophysiologist:  None    Patient Profile:   Glenn Collins is a 74 y.o. male with a hx of  CAD status post RCA and LAD stenting in 2007 with 3 intervention in 2017 of the RCA for in-stent restenosis.  hx mild carotid disease.  History of hypertension and hyperlipidemia.  He never had a heart attack or stroke, PAD on pletal who is being seen today for the evaluation of syncope at the request of Dr. Sunnie Nielsen.  History of Present Illness:   Glenn Collins with hx of HTN, hyperlipidemia, mild aortic stenosis, recently diagnosed symptomatic PAD (medically managed), as well as CAD with PCI in 2007 (Cypher to mid RCA, prox RCA, mid LAD) and then in 2017 for progressive angina with overlapping DES for up to 90% ISR of the mid RCA stent.  +PAD on pletal followed by Dr. Erlene Quan  In Jan his BB was increased from 25 daily to 50 mg for increased HR    Echo 10/28/18 with EF 60-65%, no RWMA, impaired relaxation. Trivial MR, mild stenosis of aortic valve with Mean gradient 11 mmHg with AVA 1.59 cm^2.  Today in bed and pt was unresponsive- he was talking to his wife and suddenly went out.  EMS called - it was his second episode of syncope first on Tuesday while walking, again suddenly out.  and he complained of irregular HR all week.  He had felt dizzy all day yesterday, but in bed laying down he passed out- wife witnessed event.  HR was 50 per pt  He was dizzy yesterday them mildly nauseated.   No chest pain at all and has been feeling fine.  EKG:  The EKG was personally reviewed and demonstrates:  SR with new LBBB - though on last EKG was incomplete.  Telemetry:  Telemetry was personally reviewed and demonstrates:  From scene with 2:1 block with HR 48    Na 135, K+ 4.2, Cr  0.98, Mg 2.1 Troponin hs 18,44,42 hgb 13.9 ddimer 0.77 covid neg    CT head without contrast  IMPRESSION: 1. No acute intracranial abnormality. 2. Generalized parenchymal volume loss and chronic microvascular ischemic white matter disease.   2V CXR IMPRESSION: Streaky atelectatic changes, otherwise no acute cardiopulmonary abnormality.   Aortic Atherosclerosis (ICD10-I70.0).   Venous dopplers, neg DVT   EMS strips   VS 136/92 to 112/81  p 75 to 106.  Currently no complaints  Past Medical History:  Diagnosis Date   CAD (coronary artery disease) 10/27/2018   Coronary artery disease    Hypertension    PAD (peripheral artery disease) (HCC) 10/27/2018    Past Surgical History:  Procedure Laterality Date   CARDIAC SURGERY       Home Medications:  Prior to Admission medications   Medication Sig Start Date End Date Taking? Authorizing Provider  aspirin EC 81 MG tablet Take 81 mg by mouth daily.   Yes [provider]  atorvastatin (LIPITOR) 80 MG tablet Take 1 tablet (80 mg total) by mouth daily at 6 PM. 04/03/19  Yes Arty Baumgartner, NP  cilostazol (PLETAL) 50 MG tablet TAKE ONE TABLET BY MOUTH TWICE A DAY Patient taking differently: Take 50 mg by mouth 2 (two) times daily.  07/08/19  Yes Runell Gess, MD  diphenhydrAMINE (BENADRYL) 25  MG tablet Take 25 mg by mouth at bedtime as needed for sleep.   Yes [provider]  ibuprofen (ADVIL) 200 MG tablet Take 200 mg by mouth daily as needed for moderate pain.   Yes [provider]  loratadine (CLARITIN) 10 MG tablet Take 10 mg by mouth daily.   Yes [provider]  metoprolol succinate (TOPROL-XL) 50 MG 24 hr tablet Take 1 tablet (50 mg total) by mouth daily. 04/23/19  Yes Runell Gess, MD  Multiple Vitamins-Minerals (CENTRUM SILVER 50+MEN) TABS Take 1 tablet by mouth daily.   Yes [provider]  nitroGLYCERIN (NITROSTAT) 0.4 MG SL tablet Place 1 tablet (0.4 mg total) under  the tongue every 5 (five) minutes x 3 doses as needed for chest pain. 10/29/18  Yes Laverda Page B, NP  ramipril (ALTACE) 10 MG capsule Take 10 mg by mouth daily.   Yes [provider]  sodium chloride (OCEAN) 0.65 % SOLN nasal spray Place 1 spray into both nostrils as needed for congestion.   Yes [provider]    Inpatient Medications: Scheduled Meds:  aspirin EC  81 mg Oral Daily   atorvastatin  80 mg Oral q1800   cilostazol  50 mg Oral BID   enoxaparin (LOVENOX) injection  40 mg Subcutaneous Q24H   loratadine  10 mg Oral Daily   multivitamin with minerals  1 tablet Oral Daily   sodium chloride flush  3 mL Intravenous Q12H   Continuous Infusions:   PRN Meds: acetaminophen **OR** acetaminophen, sodium chloride  Allergies:   No Known Allergies  Social History:   Social History   Socioeconomic History   Marital status: Married    Spouse name: Not on file   Number of children: Not on file   Years of education: Not on file   Highest education level: Not on file  Occupational History   Not on file  Tobacco Use   Smoking status: Former Smoker   Smokeless tobacco: Never Used  Substance and Sexual Activity   Alcohol use: Yes   Drug use: Never   Sexual activity: Not on file  Other Topics Concern   Not on file  Social History Narrative   Not on file   Social Determinants of Health   Financial Resource Strain:    Difficulty of Paying Living Expenses:   Food Insecurity:    Worried About Programme researcher, broadcasting/film/video in the Last Year:    Barista in the Last Year:   Transportation Needs:    Freight forwarder (Medical):    Lack of Transportation (Non-Medical):   Physical Activity:    Days of Exercise per Week:    Minutes of Exercise per Session:   Stress:    Feeling of Stress :   Social Connections:    Frequency of Communication with Friends and Family:    Frequency of Social Gatherings with Friends and Family:    Attends Religious Services:     Active Member of Clubs or Organizations:    Attends Engineer, structural:    Marital Status:   Intimate Partner Violence:    Fear of Current or Ex-Partner:    Emotionally Abused:    Physically Abused:    Sexually Abused:     Family History:    Family History  Problem Relation Age of Onset   Cancer Mother    CVA Father      ROS:  Please see the history of present illness.  General:no colds or fevers, no weight changes Skin:no rashes or ulcers HEENT:no blurred vision, no congestion CV:see HPI PUL:see HPI GI:no diarrhea constipation or melena, no indigestion GU:no hematuria, no dysuria MS:no joint pain, no claudication Neuro:no syncope, no lightheadedness Endo:no diabetes, no thyroid disease  All other ROS reviewed and negative.     Physical Exam/Data:   Vitals:   09/08/19 1345 09/08/19 1415 09/08/19 1430 09/08/19 1445  BP: 112/81 124/81 107/80 134/84  Pulse: 95 94 88 96  Resp: 14 17 17 20   Temp:      TempSrc:      SpO2: 95% 93% 94% 94%  Weight:      Height:        Intake/Output Summary (Last 24 hours) at 09/08/2019 1453 Last data filed at 09/08/2019 0252 Gross per 24 hour  Intake --  Output 350 ml  Net -350 ml   Last 3 Weights 09/08/2019 04/22/2019 01/10/2019  Weight (lbs) 205 lb 204 lb 200 lb 6.4 oz  Weight (kg) 92.987 kg 92.534 kg 90.901 kg     Body mass index is 27.8 kg/m.  General:  Well nourished, well developed, in no acute distress HEENT: normal Lymph: no adenopathy Neck: no JVD Endocrine:  No thryomegaly Vascular: Bil carotid bruits; pedal pulses 1+ bilaterally  Cardiac:  normal S1, S2; RRR; 2/6 systolic murmur no gallup rub or click Lungs:  clear to auscultation bilaterally, no wheezing, rhonchi or rales  Abd: soft, nontender, no hepatomegaly  Ext: no edema Musculoskeletal:  No deformities, BUE and BLE strength normal and equal Skin: warm and dry  Neuro:  CNs 2-12 intact, no focal abnormalities noted Psych:  Normal affect    Relevant CV Studies: Echo 10/28/18  IMPRESSIONS     1. The left ventricle has normal systolic function with an ejection  fraction of 60-65%. The cavity size was normal. Left ventricular diastolic  Doppler parameters are consistent with impaired relaxation. No evidence of  left ventricular regional wall  motion abnormalities.   2. The right ventricle has normal systolic function. The cavity was  mildly enlarged. There is no increase in right ventricular wall thickness.   3. There is mild mitral annular calcification present. No evidence of  mitral valve stenosis. Trivial mitral regurgitation.   4. The aortic valve is tricuspid. Moderate calcification of the aortic  valve. Aortic valve regurgitation is trivial by color flow Doppler. Mild  stenosis of the aortic valve. Mean gradient 11 mmHg with AVA 1.59 cm^2.   5. The aortic root is normal in size and structure.   6. The IVC is normal in size. No complete TR doppler jet so unable to  estimate PA systolic pressure.   FINDINGS   Left Ventricle: The left ventricle has normal systolic function, with an  ejection fraction of 60-65%. The cavity size was normal. There is no  increase in left ventricular wall thickness. Left ventricular diastolic  Doppler parameters are consistent with  impaired relaxation. No evidence of left ventricular regional wall motion  abnormalities..   Right Ventricle: The right ventricle has normal systolic function. The  cavity was mildly enlarged. There is no increase in right ventricular wall  thickness.   Left Atrium: Left atrial size was normal in size.   Right Atrium: Right atrial size was normal in size.   Interatrial Septum: No atrial level shunt detected by color flow Doppler.   Pericardium: There is no evidence of pericardial effusion.   Mitral Valve: The mitral valve is normal in structure.  There is mild  mitral annular calcification present. Mitral valve regurgitation is  trivial by color  flow Doppler. No evidence of mitral valve stenosis.   Tricuspid Valve: The tricuspid valve is normal in structure. Tricuspid  valve regurgitation was not visualized by color flow Doppler.   Aortic Valve: The aortic valve is tricuspid Moderate calcification of the  aortic valve. Aortic valve regurgitation is trivial by color flow Doppler.  There is Mild stenosis of the aortic valve, with a calculated valve area  of 1.59 cm.   Pulmonic Valve: The pulmonic valve was normal in structure. Pulmonic valve  regurgitation is not visualized by color flow Doppler.   Aorta: The aortic root is normal in size and structure.   Venous: The inferior vena cava is normal in size with greater than 50%  respiratory variability.   Laboratory Data:  High Sensitivity Troponin:   Recent Labs  Lab 09/08/19 0012 09/08/19 0211 09/08/19 0541  TROPONINIHS 18* 44* 42*     Chemistry Recent Labs  Lab 09/08/19 0012  NA 135  K 4.2  CL 96*  CO2 25  GLUCOSE 109*  BUN 13  CREATININE 0.98  CALCIUM 10.0  GFRNONAA >60  GFRAA >60  ANIONGAP 14    No results for input(s): PROT, ALBUMIN, AST, ALT, ALKPHOS, BILITOT in the last 168 hours. Hematology Recent Labs  Lab 09/08/19 0012  WBC 7.6  RBC 4.34  HGB 13.9  HCT 40.0  MCV 92.2  MCH 32.0  MCHC 34.8  RDW 13.5  PLT 192   BNPNo results for input(s): BNP, PROBNP in the last 168 hours.  DDimer  Recent Labs  Lab 09/08/19 0541  DDIMER 0.77*     Radiology/Studies:  X-ray chest PA and lateral  Result Date: 09/08/2019 CLINICAL DATA:  Syncopal episode EXAM: CHEST - 2 VIEW COMPARISON:  Radiograph 09/27/2018, CT 10/27/2018 FINDINGS: Some streaky atelectatic changes are present in the lung bases on background of more chronically coarsened interstitium. No focal consolidative opacity. No pneumothorax or effusion. Normal cardiac size. The aorta is calcified. The remaining cardiomediastinal contours are unremarkable. No acute osseous or soft tissue  abnormality. Degenerative changes are present in the imaged spine and shoulders. Telemetry leads overlie the chest. IMPRESSION: Streaky atelectatic changes, otherwise no acute cardiopulmonary abnormality. Aortic Atherosclerosis (ICD10-I70.0). Electronically Signed   By: Lovena Le M.D.   On: 09/08/2019 04:56   CT HEAD WO CONTRAST  Result Date: 09/08/2019 CLINICAL DATA:  Syncope EXAM: CT HEAD WITHOUT CONTRAST TECHNIQUE: Contiguous axial images were obtained from the base of the skull through the vertex without intravenous contrast. COMPARISON:  None. FINDINGS: Brain: No evidence of acute infarction, hemorrhage, hydrocephalus, extra-axial collection or mass lesion/mass effect. Symmetric prominence of the ventricles, cisterns and sulci compatible with parenchymal volume loss. Patchy areas of white matter hypoattenuation are most compatible with chronic microvascular angiopathy. Vascular: Atherosclerotic calcification of the carotid siphons and intradural vertebral arteries. No hyperdense vessel. Skull: No calvarial fracture or suspicious osseous lesion. No scalp swelling or hematoma. Sinuses/Orbits: Paranasal sinuses and mastoid air cells are predominantly clear. Included orbital structures are unremarkable. Other: Edentulous with dental prostheses. IMPRESSION: 1. No acute intracranial abnormality. 2. Generalized parenchymal volume loss and chronic microvascular ischemic white matter disease. Electronically Signed   By: Lovena Le M.D.   On: 09/08/2019 04:59   VAS [place] LOWER EXTREMITY VENOUS (DVT)  Result Date: 09/08/2019  Lower Venous DVT Study Indications: Syncope, elevated D-dimer.  Risk Factors: History of PAD. Limitations: Shadowing from calcified arteries in  bilateral thighs. Comparison Study: No prior study on file for comparison Performing Technologist: Sherren Kerns RVS  Examination Guidelines: A complete evaluation includes B-mode imaging, spectral Doppler, color Doppler, and power Doppler as  needed of all accessible portions of each vessel. Bilateral testing is considered an integral part of a complete examination. Limited examinations for reoccurring indications may be performed as noted. The reflux portion of the exam is performed with the patient in reverse Trendelenburg.  +---------+---------------+---------+-----------+----------+--------------+ RIGHT    CompressibilityPhasicitySpontaneityPropertiesThrombus Aging +---------+---------------+---------+-----------+----------+--------------+ CFV      Full           Yes      Yes                                 +---------+---------------+---------+-----------+----------+--------------+ SFJ      Full                                                        +---------+---------------+---------+-----------+----------+--------------+ FV Prox  Full                                                        +---------+---------------+---------+-----------+----------+--------------+ FV Mid   Full                                                        +---------+---------------+---------+-----------+----------+--------------+ FV DistalFull                                                        +---------+---------------+---------+-----------+----------+--------------+ PFV      Full                                                        +---------+---------------+---------+-----------+----------+--------------+ POP      Full           Yes      Yes                                 +---------+---------------+---------+-----------+----------+--------------+ PTV      Full                                                        +---------+---------------+---------+-----------+----------+--------------+ PERO     Full                                                        +---------+---------------+---------+-----------+----------+--------------+    +---------+---------------+---------+-----------+----------+--------------+  LEFT     CompressibilityPhasicitySpontaneityPropertiesThrombus Aging +---------+---------------+---------+-----------+----------+--------------+ CFV      Full           Yes      Yes                                 +---------+---------------+---------+-----------+----------+--------------+ SFJ      Full                                                        +---------+---------------+---------+-----------+----------+--------------+ FV Prox  Full                                                        +---------+---------------+---------+-----------+----------+--------------+ FV Mid   Full                                                        +---------+---------------+---------+-----------+----------+--------------+ FV DistalFull                                                        +---------+---------------+---------+-----------+----------+--------------+ PFV      Full                                                        +---------+---------------+---------+-----------+----------+--------------+ POP      Full           Yes      Yes                                 +---------+---------------+---------+-----------+----------+--------------+ PTV      Full                                                        +---------+---------------+---------+-----------+----------+--------------+ PERO     Full                                                        +---------+---------------+---------+-----------+----------+--------------+     Summary: BILATERAL: - No evidence of deep vein thrombosis seen in the lower extremities, bilaterally. -   *See table(s) above for measurements and observations.    Preliminary         NO CHEST PAIN Assessment and Plan:  Syncope -two episodes, one with waking and one with laying on bed.  HR in 50s at home  On torpol XL 50 mg daily  On ems  strips HR at 48 and appears to be 2:1 block PR is prolonged  Stop BB monitor HR, possible tachybrady syndrome, hold pletal as well with possible need for plavix.   Post stress myoview will decide on 30 day event monitor vs LINQ. New LBBB and troponin pk at 44, EKG in 10/2018 with incomplete LBBB  No chest pain. CAD with hx of NSTEMI with RCA and LAD stents in 2007 and 3 interventions in 2017.  Now off plavix HTN on ramipril and BB - Torpol increased to 50 from 25 in Jan this year.  No problems until last week. Elevated ddimer with neg venous dopplers, if no cardiac study with contrast will do Chest CTA for possible DDimer HLD on lipitor Bilateral carotid bruits mild  PAD right ABI 0.68 and left of 2.08 (noncompressible) with what appears to be occluded SFAs bilaterally, placed on petal without improvement and stopping plavix in 2020      For questions or updates, please contact [facility] Please consult [email or web address] for contact info under     Signed, Nada Boozer, NP  09/08/2019 2:53 PM   Patient examined chart reviewed discussed care with patient and PA. Exam with elderly white male bilateral carotid bruits, SEM, bilateral femoral bruits with decreased peripheral pulses previous right ankle fracture trace edema. Increased beta blocker dosage since January. EMS strip with HR 48 2:1 AV block No chest pain negative troponin and no ischemic ECG changes. Prior to stents had classic angina. D dimer up No dyspnea doubt PE but should have CTA. Patient does not want to have cath. Echo today to make sure EF normal. Ex Myovue in am to see HR response to exercise r/o VT/heart block with stress. Monitor on telemetry d/c lopressor. If stress test and echo ok would d/c off lopressor and will need either 30 day event monitor or ILR. Will wait to call EP until preliminary w/u done   Charlton Haws MD [facility]

## 2019-09-09 ENCOUNTER — Observation Stay (HOSPITAL_COMMUNITY): Payer: Medicare PPO

## 2019-09-09 DIAGNOSIS — E785 Hyperlipidemia, unspecified: Secondary | ICD-10-CM | POA: Diagnosis present

## 2019-09-09 DIAGNOSIS — I739 Peripheral vascular disease, unspecified: Secondary | ICD-10-CM | POA: Diagnosis present

## 2019-09-09 DIAGNOSIS — Z9861 Coronary angioplasty status: Secondary | ICD-10-CM | POA: Diagnosis not present

## 2019-09-09 DIAGNOSIS — I252 Old myocardial infarction: Secondary | ICD-10-CM | POA: Diagnosis not present

## 2019-09-09 DIAGNOSIS — Z20822 Contact with and (suspected) exposure to covid-19: Secondary | ICD-10-CM | POA: Diagnosis present

## 2019-09-09 DIAGNOSIS — Z79899 Other long term (current) drug therapy: Secondary | ICD-10-CM | POA: Diagnosis not present

## 2019-09-09 DIAGNOSIS — Z7982 Long term (current) use of aspirin: Secondary | ICD-10-CM | POA: Diagnosis not present

## 2019-09-09 DIAGNOSIS — I10 Essential (primary) hypertension: Secondary | ICD-10-CM | POA: Diagnosis present

## 2019-09-09 DIAGNOSIS — Z87891 Personal history of nicotine dependence: Secondary | ICD-10-CM | POA: Diagnosis not present

## 2019-09-09 DIAGNOSIS — I2510 Atherosclerotic heart disease of native coronary artery without angina pectoris: Secondary | ICD-10-CM

## 2019-09-09 DIAGNOSIS — R42 Dizziness and giddiness: Secondary | ICD-10-CM | POA: Diagnosis present

## 2019-09-09 DIAGNOSIS — R55 Syncope and collapse: Secondary | ICD-10-CM | POA: Diagnosis present

## 2019-09-09 DIAGNOSIS — R001 Bradycardia, unspecified: Secondary | ICD-10-CM | POA: Diagnosis not present

## 2019-09-09 DIAGNOSIS — I447 Left bundle-branch block, unspecified: Secondary | ICD-10-CM | POA: Diagnosis present

## 2019-09-09 LAB — NM MYOCAR MULTI W/SPECT W/WALL MOTION / EF
Estimated workload: 1 METS
Exercise duration (min): 5 min
Exercise duration (sec): 14 s
MPHR: 147 {beats}/min
Peak HR: 130 {beats}/min
Percent HR: 88 %
Rest HR: 86 {beats}/min

## 2019-09-09 LAB — GLUCOSE, CAPILLARY: Glucose-Capillary: 101 mg/dL — ABNORMAL HIGH (ref 70–99)

## 2019-09-09 LAB — BASIC METABOLIC PANEL
Anion gap: 8 (ref 5–15)
BUN: 12 mg/dL (ref 8–23)
CO2: 26 mmol/L (ref 22–32)
Calcium: 9.2 mg/dL (ref 8.9–10.3)
Chloride: 103 mmol/L (ref 98–111)
Creatinine, Ser: 0.82 mg/dL (ref 0.61–1.24)
GFR calc Af Amer: >60 mL/min (ref >60)
GFR calc non Af Amer: >60 mL/min (ref >60)
Glucose, Bld: 108 mg/dL — ABNORMAL HIGH (ref 70–99)
Potassium: 3.9 mmol/L (ref 3.5–5.1)
Sodium: 137 mmol/L (ref 135–145)

## 2019-09-09 MED ORDER — REGADENOSON 0.4 MG/5ML IV SOLN
INTRAVENOUS | Status: AC
  Filled 2019-09-09: qty 5

## 2019-09-09 MED ORDER — REGADENOSON 0.4 MG/5ML IV SOLN
0.4000 mg | Freq: Once | INTRAVENOUS | Status: DC
  Filled 2019-09-09: qty 5

## 2019-09-09 MED ORDER — TECHNETIUM TC 99M TETROFOSMIN IV KIT
10.0000 | PACK | Freq: Once | INTRAVENOUS | Status: AC | PRN
  Administered 2019-09-09: 10 via INTRAVENOUS

## 2019-09-09 NOTE — Care Management Obs Status (Signed)
MEDICARE OBSERVATION STATUS NOTIFICATION   Patient Details  Name: Glenn Collins MRN: [id number] Date of Birth: 04/01/1946   Medicare Observation Status Notification Given:  Yes    Lawerance Sabal, RN 09/09/2019, 11:49 AM

## 2019-09-09 NOTE — Progress Notes (Signed)
Patient arrived back to room, VS stable and patient free from pain. MEWs has returned to green.

## 2019-09-09 NOTE — Progress Notes (Signed)
Patient in Nuc Med at the time of this yellow MEWs 

## 2019-09-09 NOTE — Progress Notes (Signed)
Patient off floor to Nuclear medicine.

## 2019-09-09 NOTE — Progress Notes (Signed)
Patient in Nuc Med at the time of this MEWs

## 2019-09-09 NOTE — Progress Notes (Signed)
Patient in Nuc Med at the time of this yellow MEWs

## 2019-09-09 NOTE — Progress Notes (Signed)
PROGRESS NOTE    Glenn Collins  AYT:[id number] DOB: 10-19-1945 DOA: 09/07/2019 PCP: Patient, No Pcp Per   Brief Narrative: 74 year old medical history significant for CAD status post RCA and LAD stenting 2007 and 3 interventions back in 2017, hypertension, hyperlipidemia, PAD who presents after 2 syncope episode.  See HPI for details of episodes.  Patient in the ED: Per EMS rhythm show heart rate ranging from 40s to 90s.  Afebrile.  Heart rate in the ED 80s to 100.  Initial troponin XVIII subsequent 44.-New left bundle branch block.  Assessment & Plan:   Principal Problem:   Syncope Active Problems:   PAD (peripheral artery disease) (HCC)   Hypertension   CAD (coronary artery disease)   Hyperlipidemia   1-Syncope: Times 2  Concern for  arrhythmia Patient also with a new left bundle branch block, mild elevation of troponin.  Cardiology has been consulted. CT angio negative for PE>  Continue to monitor on telemetry. Magnesium level normal. CT Head: No acute intracranial abnormalities. Echo: normal EF, no wall motion abnormalities.  Myoview pending.  Cardiology recommending continue telemetry and EP consult tomorrow.   2-CAD status post PCI: New bundle branch block.  Cardiology consulted  3-Hypertension: Hold blood pressure medication. Holding Metoprolol due to bradycardia.  4-Hyperlipidemia: Continue Lipitor  5-PAD: Continue Lipitor, resume aspirin and cilostazol.  6-Mild elevation of D-dimer: Doppler lower extremity; report no evidence of DVT.  Estimated body mass index is 26.62 kg/m as calculated from the following:   Height as of this encounter: 6' (1.829 m).   Weight as of this encounter: 89 kg.   DVT prophylaxis:  Code Status: Full code Family Communication: Care discussed with patient Disposition Plan:  Status is: Inpatient.   Awaiting myo-view, monitor on telemetry for arrhythmia, EP consult 5/26  Dispo: The patient is from: Home               Anticipated d/c is to: Home              Anticipated d/c date is: 1 day              Patient currently is not medically stable to d/c.        Consultants:   Cardiology  Procedures:   Echo  Lower extremity: No evidence of DVT  Antimicrobials:    Subjective: He denies dizziness, no chest pain.   Objective: Vitals:   09/09/19 0947 09/09/19 0949 09/09/19 0950 09/09/19 1132  BP: (!) 144/75 138/73 127/78 125/82  Pulse: (!) 125 (!) 120 (!) 114 99  Resp:    18  Temp:    98.1 F (36.7 C)  TempSrc:    Oral  SpO2:    96%  Weight:      Height:        Intake/Output Summary (Last 24 hours) at 09/09/2019 1556 Last data filed at 09/09/2019 0715 Gross per 24 hour  Intake 0 ml  Output -  Net 0 ml   Filed Weights   09/08/19 0007 09/08/19 2100 09/09/19 0443  Weight: 93 kg 91 kg 89 kg    Examination:  General exam: NAD Respiratory system: CTA Cardiovascular system; S 1, S 2 Systolic murmur Gastrointestinal system: BS present, soft, soft, nt Central nervous system: alert.  Extremities: no edema   Data Reviewed: I have personally reviewed following labs and imaging studies  CBC: Recent Labs  Lab 09/08/19 0012  WBC 7.6  HGB 13.9  HCT 40.0  MCV 92.2  PLT 192  Basic Metabolic Panel: Recent Labs  Lab 09/08/19 0012 09/08/19 0942 09/09/19 0530  NA 135  --  137  K 4.2  --  3.9  CL 96*  --  103  CO2 25  --  26  GLUCOSE 109*  --  108*  BUN 13  --  12  CREATININE 0.98  --  0.82  CALCIUM 10.0  --  9.2  MG  --  2.1  --    GFR: Estimated Creatinine Clearance: 88.1 mL/min (by C-G formula based on SCr of 0.82 mg/dL). Liver Function Tests: No results for input(s): AST, ALT, ALKPHOS, BILITOT, PROT, ALBUMIN in the last 168 hours. No results for input(s): LIPASE, AMYLASE in the last 168 hours. No results for input(s): AMMONIA in the last 168 hours. Coagulation Profile: No results for input(s): INR, PROTIME in the last 168 hours. Cardiac Enzymes: No results for  input(s): CKTOTAL, CKMB, CKMBINDEX, TROPONINI in the last 168 hours. BNP (last 3 results) No results for input(s): PROBNP in the last 8760 hours. HbA1C: No results for input(s): HGBA1C in the last 72 hours. CBG: Recent Labs  Lab 09/08/19 0059 09/08/19 0530 09/09/19 0613  GLUCAP 111* 103* 101*   Lipid Profile: No results for input(s): CHOL, HDL, LDLCALC, TRIG, CHOLHDL, LDLDIRECT in the last 72 hours. Thyroid Function Tests: No results for input(s): TSH, T4TOTAL, FREET4, T3FREE, THYROIDAB in the last 72 hours. Anemia Panel: No results for input(s): VITAMINB12, FOLATE, FERRITIN, TIBC, IRON, RETICCTPCT in the last 72 hours. Sepsis Labs: No results for input(s): PROCALCITON, LATICACIDVEN in the last 168 hours.  Recent Results (from the past 240 hour(s))  SARS Coronavirus 2 by RT PCR (hospital order, performed in [facility] hospital lab) Nasopharyngeal Nasopharyngeal Swab     Status: None   Collection Time: 09/08/19  2:47 AM   Specimen: Nasopharyngeal Swab  Result Value Ref Range Status   SARS Coronavirus 2 NEGATIVE NEGATIVE Final    Comment: (NOTE) SARS-CoV-2 target nucleic acids are NOT DETECTED. The SARS-CoV-2 RNA is generally detectable in upper and lower respiratory specimens during the acute phase of infection. The lowest concentration of SARS-CoV-2 viral copies this assay can detect is 250 copies / mL. A negative result does not preclude SARS-CoV-2 infection and should not be used as the sole basis for treatment or other patient management decisions.  A negative result may occur with improper specimen collection / handling, submission of specimen other than nasopharyngeal swab, presence of viral mutation(s) within the areas targeted by this assay, and inadequate number of viral copies (<250 copies / mL). A negative result must be combined with clinical observations, patient history, and epidemiological information. Fact Sheet for Patients:    BoilerBrush.com.cy Fact Sheet for Healthcare Providers: https://pope.com/ This test is not yet approved or cleared  by the [place] FDA and has been authorized for detection and/or diagnosis of SARS-CoV-2 by FDA under an Emergency Use Authorization (EUA).  This EUA will remain in effect (meaning this test can be used) for the duration of the COVID-19 declaration under Section 564(b)(1) of the Act, 21 U.S.C. section 360bbb-3(b)(1), unless the authorization is terminated or revoked sooner. Performed at [facility], [address] [place], [place] [address]          Radiology Studies: X-ray chest PA and lateral  Result Date: 09/08/2019 CLINICAL DATA:  Syncopal episode EXAM: CHEST - 2 VIEW COMPARISON:  Radiograph 09/27/2018, CT 10/27/2018 FINDINGS: Some streaky atelectatic changes are present in the lung bases on background of  more chronically coarsened interstitium. No focal consolidative opacity. No pneumothorax or effusion. Normal cardiac size. The aorta is calcified. The remaining cardiomediastinal contours are unremarkable. No acute osseous or soft tissue abnormality. Degenerative changes are present in the imaged spine and shoulders. Telemetry leads overlie the chest. IMPRESSION: Streaky atelectatic changes, otherwise no acute cardiopulmonary abnormality. Aortic Atherosclerosis (ICD10-I70.0). Electronically Signed   By: Kreg Shropshire M.D.   On: 09/08/2019 04:56   CT HEAD WO CONTRAST  Result Date: 09/08/2019 CLINICAL DATA:  Syncope EXAM: CT HEAD WITHOUT CONTRAST TECHNIQUE: Contiguous axial images were obtained from the base of the skull through the vertex without intravenous contrast. COMPARISON:  None. FINDINGS: Brain: No evidence of acute infarction, hemorrhage, hydrocephalus, extra-axial collection or mass lesion/mass effect. Symmetric prominence of the ventricles, cisterns and sulci compatible with parenchymal volume loss.  Patchy areas of white matter hypoattenuation are most compatible with chronic microvascular angiopathy. Vascular: Atherosclerotic calcification of the carotid siphons and intradural vertebral arteries. No hyperdense vessel. Skull: No calvarial fracture or suspicious osseous lesion. No scalp swelling or hematoma. Sinuses/Orbits: Paranasal sinuses and mastoid air cells are predominantly clear. Included orbital structures are unremarkable. Other: Edentulous with dental prostheses. IMPRESSION: 1. No acute intracranial abnormality. 2. Generalized parenchymal volume loss and chronic microvascular ischemic white matter disease. Electronically Signed   By: Kreg Shropshire M.D.   On: 09/08/2019 04:59   CT ANGIO CHEST PE W OR WO CONTRAST  Result Date: 09/08/2019 CLINICAL DATA:  Recurrent syncope, elevated D-dimer, tachycardia EXAM: CT ANGIOGRAPHY CHEST WITH CONTRAST TECHNIQUE: Multidetector CT imaging of the chest was performed using the standard protocol during bolus administration of intravenous contrast. Multiplanar CT image reconstructions and MIPs were obtained to evaluate the vascular anatomy. CONTRAST:  77mL OMNIPAQUE IOHEXOL 350 MG/ML SOLN COMPARISON:  10/27/2018 FINDINGS: Cardiovascular: This is a technically adequate evaluation of the pulmonary vasculature. There are no filling defects or pulmonary emboli. Heart is unremarkable without pericardial effusion. Normal caliber of the thoracic aorta. Severe atherosclerosis of the aorta and coronary vasculature unchanged. Mediastinum/Nodes: No enlarged mediastinal, hilar, or axillary lymph nodes. Thyroid gland, trachea, and esophagus demonstrate no significant findings. Lungs/Pleura: Calcified granuloma right lower lobe. No airspace disease, effusion, or pneumothorax. The central airways are patent. Upper Abdomen: Punctate calcified gallstone is identified. Otherwise the upper abdomen is unremarkable. Musculoskeletal: No acute or destructive bony lesions. Reconstructed  images demonstrate no additional findings. Review of the MIP images confirms the above findings. IMPRESSION: 1. No CT evidence of pulmonary embolism. 2. Cholelithiasis. 3. Aortic Atherosclerosis (ICD10-I70.0). Electronically Signed   By: Sharlet Salina M.D.   On: 09/08/2019 19:18   ECHOCARDIOGRAM COMPLETE  Result Date: 09/08/2019    ECHOCARDIOGRAM REPORT   Patient Name:   AUDRIC VENN Date of Exam: 09/08/2019 Medical Rec #:  [id number]      Height:       72.0 in Accession #:    [id number]     Weight:       205.0 lb Date of Birth:  06/07/1945      BSA:          2.153 m Patient Age:    73 years       BP:           118/79 mmHg Patient Gender: M              HR:           92 bpm. Exam Location:  Inpatient Procedure: 2D Echo, Cardiac Doppler and Color Doppler Indications:  Syncope  History:        Patient has prior history of Echocardiogram examinations, most                 recent 10/28/2018. CAD and Previous Myocardial Infarction, Aortic                 Valve Disease; Risk Factors:Hypertension, Dyslipidemia and                 Former Smoker.  Sonographer:    Ross LudwigArthur Guy RDCS (AE) Referring Phys: [id number] [facility]enter[facility]ASUNDHRA RATHORE  Sonographer Comments: No subcostal window. IMPRESSIONS  1. Left ventricular ejection fraction, by estimation, is 55 to 60%. The left ventricle has normal function. The left ventricle has no regional wall motion abnormalities. Left ventricular diastolic parameters are consistent with Grade I diastolic dysfunction (impaired relaxation).  2. Right ventricular systolic function is normal. The right ventricular size is normal.  3. The mitral valve is grossly normal. No evidence of mitral valve regurgitation. No evidence of mitral stenosis.  4. The aortic valve is tricuspid. Aortic valve regurgitation is not visualized. Mild aortic valve stenosis.  5. Aortic dilatation noted. There is mild dilatation of the ascending aorta measuring 39 mm. FINDINGS  Left Ventricle: Left ventricular ejection fraction, by  estimation, is 55 to 60%. The left ventricle has normal function. The left ventricle has no regional wall motion abnormalities. The left ventricular internal cavity size was normal in size. There is  no left ventricular hypertrophy. Left ventricular diastolic parameters are consistent with Grade I diastolic dysfunction (impaired relaxation). Right Ventricle: The right ventricular size is normal. No increase in right ventricular wall thickness. Right ventricular systolic function is normal. Left Atrium: Left atrial size was normal in size. Right Atrium: Right atrial size was normal in size. Pericardium: There is no evidence of pericardial effusion. Mitral Valve: The mitral valve is grossly normal. No evidence of mitral valve regurgitation. No evidence of mitral valve stenosis. Tricuspid Valve: The tricuspid valve is normal in structure. Tricuspid valve regurgitation is not demonstrated. Aortic Valve: The aortic valve is tricuspid. . There is mild thickening and mild calcification of the aortic valve. Aortic valve regurgitation is not visualized. Mild aortic stenosis is present. Mild aortic valve annular calcification. There is mild thickening of the aortic valve. There is mild calcification of the aortic valve. Aortic valve mean gradient measures 8.8 mmHg. Aortic valve peak gradient measures 15.2 mmHg. Aortic valve area, by VTI measures 1.20 cm. Pulmonic Valve: The pulmonic valve was grossly normal. Pulmonic valve regurgitation is not visualized. Aorta: Aortic dilatation noted. There is mild dilatation of the ascending aorta measuring 39 mm. IAS/Shunts: The atrial septum is grossly normal.  LEFT VENTRICLE PLAX 2D LVIDd:         4.10 cm  Diastology LVIDs:         2.70 cm  LV e' lateral:   7.18 cm/s LV PW:         1.50 cm  LV E/e' lateral: 5.1 LV IVS:        1.40 cm  LV e' medial:    4.57 cm/s LVOT diam:     2.10 cm  LV E/e' medial:  8.0 LV SV:         42 LV SV Index:   20 LVOT Area:     3.46 cm  RIGHT VENTRICLE RV  Basal diam:  2.90 cm RV S prime:     16.40 cm/s TAPSE (M-mode): 3.0 cm LEFT ATRIUM  Index       RIGHT ATRIUM           Index LA diam:        2.60 cm 1.21 cm/m  RA Area:     20.90 cm LA Vol (A2C):   56.5 ml 26.24 ml/m RA Volume:   56.70 ml  26.33 ml/m LA Vol (A4C):   53.5 ml 24.85 ml/m LA Biplane Vol: 59.4 ml 27.59 ml/m  AORTIC VALVE AV Area (Vmax):    1.31 cm AV Area (Vmean):   1.24 cm AV Area (VTI):     1.20 cm AV Vmax:           194.75 cm/s AV Vmean:          140.250 cm/s AV VTI:            0.354 m AV Peak Grad:      15.2 mmHg AV Mean Grad:      8.8 mmHg LVOT Vmax:         73.83 cm/s LVOT Vmean:        50.133 cm/s LVOT VTI:          0.123 m LVOT/AV VTI ratio: 0.35  AORTA Ao Root diam: 3.90 cm MITRAL VALVE MV Area (PHT): 6.71 cm    SHUNTS MV Decel Time: 113 msec    Systemic VTI:  0.12 m MV E velocity: 36.70 cm/s  Systemic Diam: 2.10 cm MV A velocity: 96.70 cm/s MV E/A ratio:  0.38 Kristeen Miss MD Electronically signed by Kristeen Miss MD Signature Date/Time: 09/08/2019/5:13:57 PM    Final    VAS [place] LOWER EXTREMITY VENOUS (DVT)  Result Date: 09/08/2019  Lower Venous DVT Study Indications: Syncope, elevated D-dimer.  Risk Factors: History of PAD. Limitations: Shadowing from calcified arteries in bilateral thighs. Comparison Study: No prior study on file for comparison Performing Technologist: Sherren Kerns RVS  Examination Guidelines: A complete evaluation includes B-mode imaging, spectral Doppler, color Doppler, and power Doppler as needed of all accessible portions of each vessel. Bilateral testing is considered an integral part of a complete examination. Limited examinations for reoccurring indications may be performed as noted. The reflux portion of the exam is performed with the patient in reverse Trendelenburg.  +---------+---------------+---------+-----------+----------+--------------+ RIGHT    CompressibilityPhasicitySpontaneityPropertiesThrombus Aging  +---------+---------------+---------+-----------+----------+--------------+ CFV      Full           Yes      Yes                                 +---------+---------------+---------+-----------+----------+--------------+ SFJ      Full                                                        +---------+---------------+---------+-----------+----------+--------------+ FV Prox  Full                                                        +---------+---------------+---------+-----------+----------+--------------+ FV Mid   Full                                                        +---------+---------------+---------+-----------+----------+--------------+  FV DistalFull                                                        +---------+---------------+---------+-----------+----------+--------------+ PFV      Full                                                        +---------+---------------+---------+-----------+----------+--------------+ POP      Full           Yes      Yes                                 +---------+---------------+---------+-----------+----------+--------------+ PTV      Full                                                        +---------+---------------+---------+-----------+----------+--------------+ PERO     Full                                                        +---------+---------------+---------+-----------+----------+--------------+   +---------+---------------+---------+-----------+----------+--------------+ LEFT     CompressibilityPhasicitySpontaneityPropertiesThrombus Aging +---------+---------------+---------+-----------+----------+--------------+ CFV      Full           Yes      Yes                                 +---------+---------------+---------+-----------+----------+--------------+ SFJ      Full                                                         +---------+---------------+---------+-----------+----------+--------------+ FV Prox  Full                                                        +---------+---------------+---------+-----------+----------+--------------+ FV Mid   Full                                                        +---------+---------------+---------+-----------+----------+--------------+ FV DistalFull                                                        +---------+---------------+---------+-----------+----------+--------------+  PFV      Full                                                        +---------+---------------+---------+-----------+----------+--------------+ POP      Full           Yes      Yes                                 +---------+---------------+---------+-----------+----------+--------------+ PTV      Full                                                        +---------+---------------+---------+-----------+----------+--------------+ PERO     Full                                                        +---------+---------------+---------+-----------+----------+--------------+     Summary: BILATERAL: - No evidence of deep vein thrombosis seen in the lower extremities, bilaterally. -   *See table(s) above for measurements and observations. Electronically signed by Fabienne Bruns MD on 09/08/2019 at 6:34:03 PM.    Final         Scheduled Meds: . aspirin EC  81 mg Oral Daily  . atorvastatin  80 mg Oral q1800  . cilostazol  50 mg Oral BID  . enoxaparin (LOVENOX) injection  40 mg Subcutaneous Q24H  . loratadine  10 mg Oral Daily  . multivitamin with minerals  1 tablet Oral Daily  . regadenoson      . regadenoson  0.4 mg Intravenous Once  . sodium chloride flush  3 mL Intravenous Q12H   Continuous Infusions:   LOS: 0 days    Time spent: 35 minutes.     Alba Cory, MD [facility]   If 7PM-7AM, please contact night-coverage  [email or web address]  09/09/2019, 3:56 PM

## 2019-09-09 NOTE — Progress Notes (Signed)
   Glenn Collins presented for a nuclear stress test today.  No immediate complications.  Stress imaging is pending at this time.  Preliminary EKG findings may be listed in the chart, but the stress test result will not be finalized until perfusion imaging is complete.  One day study, [facility] to read.  Georgie Chard, NP-C 09/09/2019, 1:56 PM

## 2019-09-09 NOTE — Progress Notes (Signed)
Subjective:  Going for nuclear scan  Objective:  Vitals:   09/08/19 2006 09/08/19 2100 09/08/19 2334 09/09/19 0443  BP: 137/83  137/80 138/83  Pulse: 94  85 82  Resp: 18  18 18   Temp: 98.3 F (36.8 C)  97.9 F (36.6 C) 98.1 F (36.7 C)  TempSrc: Oral  Oral Oral  SpO2: 97%  96% 95%  Weight:  91 kg  89 kg  Height:        Intake/Output from previous day: No intake or output data in the 24 hours ending 09/09/19 0848  Physical Exam: Affect appropriate Healthy:  appears stated age HEENT: normal Neck supple with no adenopathy JVP normal bilateral carotid  bruits no thyromegaly Lungs clear with no wheezing and good diaphragmatic motion Heart:  S1/S2 no murmur, no rub, gallop or click PMI normal Abdomen: benighn, BS positve, no tenderness, no AAA Bilateral femoral  bruit.  No HSM or HJR No edema Neuro non-focal Skin warm and dry No muscular weakness   Lab Results: Basic Metabolic Panel: Recent Labs    09/08/19 0012 09/08/19 0942 09/09/19 0530  NA 135  --  137  K 4.2  --  3.9  CL 96*  --  103  CO2 25  --  26  GLUCOSE 109*  --  108*  BUN 13  --  12  CREATININE 0.98  --  0.82  CALCIUM 10.0  --  9.2  MG  --  2.1  --    Liver Function Tests: No results for input(s): AST, ALT, ALKPHOS, BILITOT, PROT, ALBUMIN in the last 72 hours. No results for input(s): LIPASE, AMYLASE in the last 72 hours. CBC: Recent Labs    09/08/19 0012  WBC 7.6  HGB 13.9  HCT 40.0  MCV 92.2  PLT 192   Cardiac Enzymes: No results for input(s): CKTOTAL, CKMB, CKMBINDEX, TROPONINI in the last 72 hours. BNP: Invalid input(s): POCBNP D-Dimer: Recent Labs    09/08/19 0541  DDIMER 0.77*     Imaging: X-ray chest PA and lateral  Result Date: 09/08/2019 CLINICAL DATA:  Syncopal episode EXAM: CHEST - 2 VIEW COMPARISON:  Radiograph 09/27/2018, CT 10/27/2018 FINDINGS: Some streaky atelectatic changes are present in the lung bases on background of more chronically coarsened  interstitium. No focal consolidative opacity. No pneumothorax or effusion. Normal cardiac size. The aorta is calcified. The remaining cardiomediastinal contours are unremarkable. No acute osseous or soft tissue abnormality. Degenerative changes are present in the imaged spine and shoulders. Telemetry leads overlie the chest. IMPRESSION: Streaky atelectatic changes, otherwise no acute cardiopulmonary abnormality. Aortic Atherosclerosis (ICD10-I70.0). Electronically Signed   By: 12/28/2018 M.D.   On: 09/08/2019 04:56   CT HEAD WO CONTRAST  Result Date: 09/08/2019 CLINICAL DATA:  Syncope EXAM: CT HEAD WITHOUT CONTRAST TECHNIQUE: Contiguous axial images were obtained from the base of the skull through the vertex without intravenous contrast. COMPARISON:  None. FINDINGS: Brain: No evidence of acute infarction, hemorrhage, hydrocephalus, extra-axial collection or mass lesion/mass effect. Symmetric prominence of the ventricles, cisterns and sulci compatible with parenchymal volume loss. Patchy areas of white matter hypoattenuation are most compatible with chronic microvascular angiopathy. Vascular: Atherosclerotic calcification of the carotid siphons and intradural vertebral arteries. No hyperdense vessel. Skull: No calvarial fracture or suspicious osseous lesion. No scalp swelling or hematoma. Sinuses/Orbits: Paranasal sinuses and mastoid air cells are predominantly clear. Included orbital structures are unremarkable. Other: Edentulous with dental prostheses. IMPRESSION: 1. No acute intracranial abnormality. 2. Generalized parenchymal volume loss  and chronic microvascular ischemic white matter disease. Electronically Signed   By: Lovena Le M.D.   On: 09/08/2019 04:59   CT ANGIO CHEST PE W OR WO CONTRAST  Result Date: 09/08/2019 CLINICAL DATA:  Recurrent syncope, elevated D-dimer, tachycardia EXAM: CT ANGIOGRAPHY CHEST WITH CONTRAST TECHNIQUE: Multidetector CT imaging of the chest was performed using the  standard protocol during bolus administration of intravenous contrast. Multiplanar CT image reconstructions and MIPs were obtained to evaluate the vascular anatomy. CONTRAST:  8mL OMNIPAQUE IOHEXOL 350 MG/ML SOLN COMPARISON:  10/27/2018 FINDINGS: Cardiovascular: This is a technically adequate evaluation of the pulmonary vasculature. There are no filling defects or pulmonary emboli. Heart is unremarkable without pericardial effusion. Normal caliber of the thoracic aorta. Severe atherosclerosis of the aorta and coronary vasculature unchanged. Mediastinum/Nodes: No enlarged mediastinal, hilar, or axillary lymph nodes. Thyroid gland, trachea, and esophagus demonstrate no significant findings. Lungs/Pleura: Calcified granuloma right lower lobe. No airspace disease, effusion, or pneumothorax. The central airways are patent. Upper Abdomen: Punctate calcified gallstone is identified. Otherwise the upper abdomen is unremarkable. Musculoskeletal: No acute or destructive bony lesions. Reconstructed images demonstrate no additional findings. Review of the MIP images confirms the above findings. IMPRESSION: 1. No CT evidence of pulmonary embolism. 2. Cholelithiasis. 3. Aortic Atherosclerosis (ICD10-I70.0). Electronically Signed   By: Randa Ngo M.D.   On: 09/08/2019 19:18   ECHOCARDIOGRAM COMPLETE  Result Date: 09/08/2019    ECHOCARDIOGRAM REPORT   Patient Name:   Glenn Collins Date of Exam: 09/08/2019 Medical Rec #:  [id number]      Height:       72.0 in Accession #:    [id number]     Weight:       205.0 lb Date of Birth:  Feb 14, 1946      BSA:          2.153 m Patient Age:    74 years       BP:           118/79 mmHg Patient Gender: M              HR:           92 bpm. Exam Location:  Inpatient Procedure: 2D Echo, Cardiac Doppler and Color Doppler Indications:    Syncope  History:        Patient has prior history of Echocardiogram examinations, most                 recent 10/28/2018. CAD and Previous Myocardial  Infarction, Aortic                 Valve Disease; Risk Factors:Hypertension, Dyslipidemia and                 Former Smoker.  Sonographer:    Clayton Lefort RDCS (AE) Referring Phys: [id number] [facility]  Sonographer Comments: No subcostal window. IMPRESSIONS  1. Left ventricular ejection fraction, by estimation, is 55 to 60%. The left ventricle has normal function. The left ventricle has no regional wall motion abnormalities. Left ventricular diastolic parameters are consistent with Grade I diastolic dysfunction (impaired relaxation).  2. Right ventricular systolic function is normal. The right ventricular size is normal.  3. The mitral valve is grossly normal. No evidence of mitral valve regurgitation. No evidence of mitral stenosis.  4. The aortic valve is tricuspid. Aortic valve regurgitation is not visualized. Mild aortic valve stenosis.  5. Aortic dilatation noted. There is mild dilatation of the ascending aorta measuring 39 mm. FINDINGS  Left Ventricle: Left ventricular ejection fraction, by estimation, is 55 to 60%. The left ventricle has normal function. The left ventricle has no regional wall motion abnormalities. The left ventricular internal cavity size was normal in size. There is  no left ventricular hypertrophy. Left ventricular diastolic parameters are consistent with Grade I diastolic dysfunction (impaired relaxation). Right Ventricle: The right ventricular size is normal. No increase in right ventricular wall thickness. Right ventricular systolic function is normal. Left Atrium: Left atrial size was normal in size. Right Atrium: Right atrial size was normal in size. Pericardium: There is no evidence of pericardial effusion. Mitral Valve: The mitral valve is grossly normal. No evidence of mitral valve regurgitation. No evidence of mitral valve stenosis. Tricuspid Valve: The tricuspid valve is normal in structure. Tricuspid valve regurgitation is not demonstrated. Aortic Valve: The aortic valve is  tricuspid. . There is mild thickening and mild calcification of the aortic valve. Aortic valve regurgitation is not visualized. Mild aortic stenosis is present. Mild aortic valve annular calcification. There is mild thickening of the aortic valve. There is mild calcification of the aortic valve. Aortic valve mean gradient measures 8.8 mmHg. Aortic valve peak gradient measures 15.2 mmHg. Aortic valve area, by VTI measures 1.20 cm. Pulmonic Valve: The pulmonic valve was grossly normal. Pulmonic valve regurgitation is not visualized. Aorta: Aortic dilatation noted. There is mild dilatation of the ascending aorta measuring 39 mm. IAS/Shunts: The atrial septum is grossly normal.  LEFT VENTRICLE PLAX 2D LVIDd:         4.10 cm  Diastology LVIDs:         2.70 cm  LV e' lateral:   7.18 cm/s LV PW:         1.50 cm  LV E/e' lateral: 5.1 LV IVS:        1.40 cm  LV e' medial:    4.57 cm/s LVOT diam:     2.10 cm  LV E/e' medial:  8.0 LV SV:         42 LV SV Index:   20 LVOT Area:     3.46 cm  RIGHT VENTRICLE RV Basal diam:  2.90 cm RV S prime:     16.40 cm/s TAPSE (M-mode): 3.0 cm LEFT ATRIUM             Index       RIGHT ATRIUM           Index LA diam:        2.60 cm 1.21 cm/m  RA Area:     20.90 cm LA Vol (A2C):   56.5 ml 26.24 ml/m RA Volume:   56.70 ml  26.33 ml/m LA Vol (A4C):   53.5 ml 24.85 ml/m LA Biplane Vol: 59.4 ml 27.59 ml/m  AORTIC VALVE AV Area (Vmax):    1.31 cm AV Area (Vmean):   1.24 cm AV Area (VTI):     1.20 cm AV Vmax:           194.75 cm/s AV Vmean:          140.250 cm/s AV VTI:            0.354 m AV Peak Grad:      15.2 mmHg AV Mean Grad:      8.8 mmHg LVOT Vmax:         73.83 cm/s LVOT Vmean:        50.133 cm/s LVOT VTI:          0.123 m LVOT/AV  VTI ratio: 0.35  AORTA Ao Root diam: 3.90 cm MITRAL VALVE MV Area (PHT): 6.71 cm    SHUNTS MV Decel Time: 113 msec    Systemic VTI:  0.12 m MV E velocity: 36.70 cm/s  Systemic Diam: 2.10 cm MV A velocity: 96.70 cm/s MV E/A ratio:  0.38 Kristeen Miss MD  Electronically signed by Kristeen Miss MD Signature Date/Time: 09/08/2019/5:13:57 PM    Final    VAS [place] LOWER EXTREMITY VENOUS (DVT)  Result Date: 09/08/2019  Lower Venous DVT Study Indications: Syncope, elevated D-dimer.  Risk Factors: History of PAD. Limitations: Shadowing from calcified arteries in bilateral thighs. Comparison Study: No prior study on file for comparison Performing Technologist: Sherren Kerns RVS  Examination Guidelines: A complete evaluation includes B-mode imaging, spectral Doppler, color Doppler, and power Doppler as needed of all accessible portions of each vessel. Bilateral testing is considered an integral part of a complete examination. Limited examinations for reoccurring indications may be performed as noted. The reflux portion of the exam is performed with the patient in reverse Trendelenburg.  +---------+---------------+---------+-----------+----------+--------------+ RIGHT    CompressibilityPhasicitySpontaneityPropertiesThrombus Aging +---------+---------------+---------+-----------+----------+--------------+ CFV      Full           Yes      Yes                                 +---------+---------------+---------+-----------+----------+--------------+ SFJ      Full                                                        +---------+---------------+---------+-----------+----------+--------------+ FV Prox  Full                                                        +---------+---------------+---------+-----------+----------+--------------+ FV Mid   Full                                                        +---------+---------------+---------+-----------+----------+--------------+ FV DistalFull                                                        +---------+---------------+---------+-----------+----------+--------------+ PFV      Full                                                         +---------+---------------+---------+-----------+----------+--------------+ POP      Full           Yes      Yes                                 +---------+---------------+---------+-----------+----------+--------------+  PTV      Full                                                        +---------+---------------+---------+-----------+----------+--------------+ PERO     Full                                                        +---------+---------------+---------+-----------+----------+--------------+   +---------+---------------+---------+-----------+----------+--------------+ LEFT     CompressibilityPhasicitySpontaneityPropertiesThrombus Aging +---------+---------------+---------+-----------+----------+--------------+ CFV      Full           Yes      Yes                                 +---------+---------------+---------+-----------+----------+--------------+ SFJ      Full                                                        +---------+---------------+---------+-----------+----------+--------------+ FV Prox  Full                                                        +---------+---------------+---------+-----------+----------+--------------+ FV Mid   Full                                                        +---------+---------------+---------+-----------+----------+--------------+ FV DistalFull                                                        +---------+---------------+---------+-----------+----------+--------------+ PFV      Full                                                        +---------+---------------+---------+-----------+----------+--------------+ POP      Full           Yes      Yes                                 +---------+---------------+---------+-----------+----------+--------------+ PTV      Full                                                         +---------+---------------+---------+-----------+----------+--------------+  PERO     Full                                                        +---------+---------------+---------+-----------+----------+--------------+     Summary: BILATERAL: - No evidence of deep vein thrombosis seen in the lower extremities, bilaterally. -   *See table(s) above for measurements and observations. Electronically signed by Fabienne Bruns MD on 09/08/2019 at 6:34:03 PM.    Final     Cardiac Studies:  ECG:    Telemetry:  SR rates 90   Echo: EF 55-60%   Medications:   . aspirin EC  81 mg Oral Daily  . atorvastatin  80 mg Oral q1800  . cilostazol  50 mg Oral BID  . enoxaparin (LOVENOX) injection  40 mg Subcutaneous Q24H  . loratadine  10 mg Oral Daily  . multivitamin with minerals  1 tablet Oral Daily  . sodium chloride flush  3 mL Intravenous Q12H      Assessment/Plan:   1. Syncope -two episodes, one with waking and one with laying on bed.  HR in 50s at home  On torpol XL 50 mg daily  On ems strips HR at 48 and appears to be 2:1 block PR is prolonged  Beta blocker stopped telemetry ok last 24 hours   hold pletal as well with possible need for plavix.   Stress myovue today to r/o ischemia  Echo shows normal EF with no new RWMAls  Will ask EP to see tomorrow decide on LINQ vs 30 day monitor  2. New LBBB and troponin pk at 44, EKG in 10/2018 with incomplete LBBB  No chest pain. EF normal myovue today  3. CAD with hx of NSTEMI with RCA and LAD stents in 2007 and 3 interventions in 2017.  Now off plavix 4. HTN on ramipril and BB - Torpol increased to 50 from 25 in Jan this year.  No problems until last week. 5. Elevated ddimer with neg venous dopplers,CTA with no PE 6. Bilateral carotid bruits mild  7. PAD right ABI 0.68 and left of 2.08 (noncompressible) with what appears to be occluded SFAs bilaterally, placed on petal without improvement and stopping plavix in 2020  Glenn Collins 09/09/2019, 8:48  AM

## 2019-09-10 ENCOUNTER — Other Ambulatory Visit: Payer: Self-pay | Admitting: *Deleted

## 2019-09-10 ENCOUNTER — Telehealth: Payer: Self-pay | Admitting: Cardiovascular Disease

## 2019-09-10 DIAGNOSIS — R001 Bradycardia, unspecified: Secondary | ICD-10-CM

## 2019-09-10 DIAGNOSIS — R55 Syncope and collapse: Secondary | ICD-10-CM

## 2019-09-10 LAB — GLUCOSE, CAPILLARY: Glucose-Capillary: 100 mg/dL — ABNORMAL HIGH (ref 70–99)

## 2019-09-10 NOTE — Progress Notes (Signed)
Subjective:  No complaints   Objective:  Vitals:   09/09/19 2015 09/09/19 2230 09/10/19 0007 09/10/19 0527  BP: 104/73  121/77 104/70  Pulse: (!) 105 (!) 105 89 92  Resp: 15  18 18   Temp: 98.7 F (37.1 C)  97.9 F (36.6 C) 98.6 F (37 C)  TempSrc: Oral  Oral Oral  SpO2: 97%  95% 95%  Weight:    90.5 kg  Height:        Intake/Output from previous day:  Intake/Output Summary (Last 24 hours) at 09/10/2019 09/12/2019 Last data filed at 09/09/2019 1800 Gross per 24 hour  Intake 600 ml  Output --  Net 600 ml    Physical Exam: Affect appropriate Healthy:  appears stated age HEENT: normal Neck supple with no adenopathy JVP normal bilateral carotid  bruits no thyromegaly Lungs clear with no wheezing and good diaphragmatic motion Heart:  S1/S2 no murmur, no rub, gallop or click PMI normal Abdomen: benighn, BS positve, no tenderness, no AAA Bilateral femoral  bruit.  No HSM or HJR No edema Neuro non-focal Skin warm and dry No muscular weakness   Lab Results: Basic Metabolic Panel: Recent Labs    09/08/19 0012 09/08/19 0942 09/09/19 0530  NA 135  --  137  K 4.2  --  3.9  CL 96*  --  103  CO2 25  --  26  GLUCOSE 109*  --  108*  BUN 13  --  12  CREATININE 0.98  --  0.82  CALCIUM 10.0  --  9.2  MG  --  2.1  --    Liver Function Tests: No results for input(s): AST, ALT, ALKPHOS, BILITOT, PROT, ALBUMIN in the last 72 hours. No results for input(s): LIPASE, AMYLASE in the last 72 hours. CBC: Recent Labs    09/08/19 0012  WBC 7.6  HGB 13.9  HCT 40.0  MCV 92.2  PLT 192   Cardiac Enzymes: No results for input(s): CKTOTAL, CKMB, CKMBINDEX, TROPONINI in the last 72 hours. BNP: Invalid input(s): POCBNP D-Dimer: Recent Labs    09/08/19 0541  DDIMER 0.77*     Imaging: CT ANGIO CHEST PE W OR WO CONTRAST  Result Date: 09/08/2019 CLINICAL DATA:  Recurrent syncope, elevated D-dimer, tachycardia EXAM: CT ANGIOGRAPHY CHEST WITH CONTRAST TECHNIQUE:  Multidetector CT imaging of the chest was performed using the standard protocol during bolus administration of intravenous contrast. Multiplanar CT image reconstructions and MIPs were obtained to evaluate the vascular anatomy. CONTRAST:  84mL OMNIPAQUE IOHEXOL 350 MG/ML SOLN COMPARISON:  10/27/2018 FINDINGS: Cardiovascular: This is a technically adequate evaluation of the pulmonary vasculature. There are no filling defects or pulmonary emboli. Heart is unremarkable without pericardial effusion. Normal caliber of the thoracic aorta. Severe atherosclerosis of the aorta and coronary vasculature unchanged. Mediastinum/Nodes: No enlarged mediastinal, hilar, or axillary lymph nodes. Thyroid gland, trachea, and esophagus demonstrate no significant findings. Lungs/Pleura: Calcified granuloma right lower lobe. No airspace disease, effusion, or pneumothorax. The central airways are patent. Upper Abdomen: Punctate calcified gallstone is identified. Otherwise the upper abdomen is unremarkable. Musculoskeletal: No acute or destructive bony lesions. Reconstructed images demonstrate no additional findings. Review of the MIP images confirms the above findings. IMPRESSION: 1. No CT evidence of pulmonary embolism. 2. Cholelithiasis. 3. Aortic Atherosclerosis (ICD10-I70.0). Electronically Signed   By: 12/28/2018 M.D.   On: 09/08/2019 19:18   NM Myocar Multi W/Spect W/Wall Motion / EF  Result Date: 09/09/2019 CLINICAL DATA:  Status post RCA and LAD stent.  EXAM: MYOCARDIAL IMAGING WITH SPECT (REST AND PHARMACOLOGIC-STRESS) GATED LEFT VENTRICULAR WALL MOTION STUDY LEFT VENTRICULAR EJECTION FRACTION TECHNIQUE: Standard myocardial SPECT imaging was performed after resting intravenous injection of 10.3 mCi Tc-98m tetrofosmin. Subsequently, intravenous infusion of Lexiscan was performed under the supervision of the Cardiology staff. At peak effect of the drug, 32.6 mCi Tc-14m tetrofosmin was injected intravenously and standard  myocardial SPECT imaging was performed. Quantitative gated imaging was also performed to evaluate left ventricular wall motion, and estimate left ventricular ejection fraction. COMPARISON:  CTA chest of 1 day prior FINDINGS: Perfusion: No decreased activity in the left ventricle on stress imaging to suggest reversible ischemia or infarction. Wall Motion: Inferior wall hypokinesis. Left Ventricular Ejection Fraction: 73 % End diastolic volume 139 ml End systolic volume 66 ml IMPRESSION: 1. No reversible ischemia or infarction. 2. Inferior wall hypokinesis. 3. Left ventricular ejection fraction 73% 4. Non invasive risk stratification*: Low *2012 Appropriate Use Criteria for Coronary Revascularization Focused Update: J Am Coll Cardiol. 2012;59(9):857-881. http://content.dementiazones.com.aspx?articleid=[email or web address] Electronically Signed   By: Jeronimo Greaves M.D.   On: 09/09/2019 16:50   ECHOCARDIOGRAM COMPLETE  Result Date: 09/08/2019    ECHOCARDIOGRAM REPORT   Patient Name:   Glenn Collins Date of Exam: 09/08/2019 Medical Rec #:  [id number]      Height:       72.0 in Accession #:    [id number]     Weight:       205.0 lb Date of Birth:  [birth year]      BSA:          2.153 m Patient Age:    73 years       BP:           118/79 mmHg Patient Gender: M              HR:           92 bpm. Exam Location:  Inpatient Procedure: 2D Echo, Cardiac Doppler and Color Doppler Indications:    Syncope  History:        Patient has prior history of Echocardiogram examinations, most                 recent 10/28/2018. CAD and Previous Myocardial Infarction, Aortic                 Valve Disease; Risk Factors:Hypertension, Dyslipidemia and                 Former Smoker.  Sonographer:    Ross Ludwig RDCS (AE) Referring Phys: [id number] [facility]  Sonographer Comments: No subcostal window. IMPRESSIONS  1. Left ventricular ejection fraction, by estimation, is 55 to 60%. The left ventricle has normal function. The left ventricle has no  regional wall motion abnormalities. Left ventricular diastolic parameters are consistent with Grade I diastolic dysfunction (impaired relaxation).  2. Right ventricular systolic function is normal. The right ventricular size is normal.  3. The mitral valve is grossly normal. No evidence of mitral valve regurgitation. No evidence of mitral stenosis.  4. The aortic valve is tricuspid. Aortic valve regurgitation is not visualized. Mild aortic valve stenosis.  5. Aortic dilatation noted. There is mild dilatation of the ascending aorta measuring 39 mm. FINDINGS  Left Ventricle: Left ventricular ejection fraction, by estimation, is 55 to 60%. The left ventricle has normal function. The left ventricle has no regional wall motion abnormalities. The left ventricular internal cavity size was normal in size. There is  no left ventricular hypertrophy. Left ventricular  diastolic parameters are consistent with Grade I diastolic dysfunction (impaired relaxation). Right Ventricle: The right ventricular size is normal. No increase in right ventricular wall thickness. Right ventricular systolic function is normal. Left Atrium: Left atrial size was normal in size. Right Atrium: Right atrial size was normal in size. Pericardium: There is no evidence of pericardial effusion. Mitral Valve: The mitral valve is grossly normal. No evidence of mitral valve regurgitation. No evidence of mitral valve stenosis. Tricuspid Valve: The tricuspid valve is normal in structure. Tricuspid valve regurgitation is not demonstrated. Aortic Valve: The aortic valve is tricuspid. . There is mild thickening and mild calcification of the aortic valve. Aortic valve regurgitation is not visualized. Mild aortic stenosis is present. Mild aortic valve annular calcification. There is mild thickening of the aortic valve. There is mild calcification of the aortic valve. Aortic valve mean gradient measures 8.8 mmHg. Aortic valve peak gradient measures 15.2 mmHg. Aortic  valve area, by VTI measures 1.20 cm. Pulmonic Valve: The pulmonic valve was grossly normal. Pulmonic valve regurgitation is not visualized. Aorta: Aortic dilatation noted. There is mild dilatation of the ascending aorta measuring 39 mm. IAS/Shunts: The atrial septum is grossly normal.  LEFT VENTRICLE PLAX 2D LVIDd:         4.10 cm  Diastology LVIDs:         2.70 cm  LV e' lateral:   7.18 cm/s LV PW:         1.50 cm  LV E/e' lateral: 5.1 LV IVS:        1.40 cm  LV e' medial:    4.57 cm/s LVOT diam:     2.10 cm  LV E/e' medial:  8.0 LV SV:         42 LV SV Index:   20 LVOT Area:     3.46 cm  RIGHT VENTRICLE RV Basal diam:  2.90 cm RV S prime:     16.40 cm/s TAPSE (M-mode): 3.0 cm LEFT ATRIUM             Index       RIGHT ATRIUM           Index LA diam:        2.60 cm 1.21 cm/m  RA Area:     20.90 cm LA Vol (A2C):   56.5 ml 26.24 ml/m RA Volume:   56.70 ml  26.33 ml/m LA Vol (A4C):   53.5 ml 24.85 ml/m LA Biplane Vol: 59.4 ml 27.59 ml/m  AORTIC VALVE AV Area (Vmax):    1.31 cm AV Area (Vmean):   1.24 cm AV Area (VTI):     1.20 cm AV Vmax:           194.75 cm/s AV Vmean:          140.250 cm/s AV VTI:            0.354 m AV Peak Grad:      15.2 mmHg AV Mean Grad:      8.8 mmHg LVOT Vmax:         73.83 cm/s LVOT Vmean:        50.133 cm/s LVOT VTI:          0.123 m LVOT/AV VTI ratio: 0.35  AORTA Ao Root diam: 3.90 cm MITRAL VALVE MV Area (PHT): 6.71 cm    SHUNTS MV Decel Time: 113 msec    Systemic VTI:  0.12 m MV E velocity: 36.70 cm/s  Systemic Diam: 2.10 cm MV A  velocity: 96.70 cm/s MV E/A ratio:  0.38 Kristeen Miss MD Electronically signed by Kristeen Miss MD Signature Date/Time: 09/08/2019/5:13:57 PM    Final    VAS [place] LOWER EXTREMITY VENOUS (DVT)  Result Date: 09/08/2019  Lower Venous DVT Study Indications: Syncope, elevated D-dimer.  Risk Factors: History of PAD. Limitations: Shadowing from calcified arteries in bilateral thighs. Comparison Study: No prior study on file for comparison Performing  Technologist: Sherren Kerns RVS  Examination Guidelines: A complete evaluation includes B-mode imaging, spectral Doppler, color Doppler, and power Doppler as needed of all accessible portions of each vessel. Bilateral testing is considered an integral part of a complete examination. Limited examinations for reoccurring indications may be performed as noted. The reflux portion of the exam is performed with the patient in reverse Trendelenburg.  +---------+---------------+---------+-----------+----------+--------------+ RIGHT    CompressibilityPhasicitySpontaneityPropertiesThrombus Aging +---------+---------------+---------+-----------+----------+--------------+ CFV      Full           Yes      Yes                                 +---------+---------------+---------+-----------+----------+--------------+ SFJ      Full                                                        +---------+---------------+---------+-----------+----------+--------------+ FV Prox  Full                                                        +---------+---------------+---------+-----------+----------+--------------+ FV Mid   Full                                                        +---------+---------------+---------+-----------+----------+--------------+ FV DistalFull                                                        +---------+---------------+---------+-----------+----------+--------------+ PFV      Full                                                        +---------+---------------+---------+-----------+----------+--------------+ POP      Full           Yes      Yes                                 +---------+---------------+---------+-----------+----------+--------------+ PTV      Full                                                        +---------+---------------+---------+-----------+----------+--------------+  PERO     Full                                                         +---------+---------------+---------+-----------+----------+--------------+   +---------+---------------+---------+-----------+----------+--------------+ LEFT     CompressibilityPhasicitySpontaneityPropertiesThrombus Aging +---------+---------------+---------+-----------+----------+--------------+ CFV      Full           Yes      Yes                                 +---------+---------------+---------+-----------+----------+--------------+ SFJ      Full                                                        +---------+---------------+---------+-----------+----------+--------------+ FV Prox  Full                                                        +---------+---------------+---------+-----------+----------+--------------+ FV Mid   Full                                                        +---------+---------------+---------+-----------+----------+--------------+ FV DistalFull                                                        +---------+---------------+---------+-----------+----------+--------------+ PFV      Full                                                        +---------+---------------+---------+-----------+----------+--------------+ POP      Full           Yes      Yes                                 +---------+---------------+---------+-----------+----------+--------------+ PTV      Full                                                        +---------+---------------+---------+-----------+----------+--------------+ PERO     Full                                                        +---------+---------------+---------+-----------+----------+--------------+  Summary: BILATERAL: - No evidence of deep vein thrombosis seen in the lower extremities, bilaterally. -   *See table(s) above for measurements and observations. Electronically signed by Ruta Hinds MD on 09/08/2019 at 6:34:03 PM.    Final      Cardiac Studies:  ECG:    Telemetry:  SR rates 90   Echo: EF 55-60%   Medications:   . aspirin EC  81 mg Oral Daily  . atorvastatin  80 mg Oral q1800  . cilostazol  50 mg Oral BID  . enoxaparin (LOVENOX) injection  40 mg Subcutaneous Q24H  . loratadine  10 mg Oral Daily  . multivitamin with minerals  1 tablet Oral Daily  . regadenoson  0.4 mg Intravenous Once  . sodium chloride flush  3 mL Intravenous Q12H      Assessment/Plan:   1. Syncope -two episodes, one with waking and one with laying on bed.  HR in 50s at home  On torpol XL 50 mg daily  On ems strips HR at 48 and appears to be 2:1 block PR is prolonged  Beta blocker stopped telemetry ok last 24 hours   hold pletal as well Myovue non ischemic no chest pain   2. New LBBB and troponin pk at 44, EKG in 10/2018 with incomplete LBBB  No chest pain. EF normal 3. CAD with hx of NSTEMI with RCA and LAD stents in 2007 and 3 interventions in 2017.  Now off plavix 4. HTN on ramipril and BB - Torpol increased to 50 from 25 in Jan this year.  No problems until last week. 5. Elevated ddimer with neg venous dopplers,CTA with no PE 6. Bilateral carotid bruits mild  7. PAD right ABI 0.68 and left of 2.08 (noncompressible) with what appears to be occluded SFAs bilaterally, placed on petal without improvement and stopping plavix in 2020  Ok to d/c home will arrange 30 day event monitor and f/u with Dr Gwenlyn Found No pletal or Toprol   Jenkins Rouge 09/10/2019, 9:14 AM

## 2019-09-10 NOTE — Discharge Instructions (Signed)
     Syncope Syncope is when you pass out (faint) for a short time. It is caused by a sudden decrease in blood flow to the brain. Signs that you may be about to pass out include:  Feeling dizzy or light-headed.  Feeling sick to your stomach (nauseous).  Seeing all white or all black.  Having cold, clammy skin. If you pass out, get help right away. Call your local emergency services (911 in the [place] Do not drive yourself to the hospital. Follow these instructions at home: Watch for any changes in your symptoms. Take these actions to stay safe and help with your symptoms: Lifestyle  Do not drive, use machinery, or play sports until your doctor says it is okay.  Do not drink alcohol.  Do not use any products that contain nicotine or tobacco, such as cigarettes and e-cigarettes. If you need help quitting, ask your doctor.  Drink enough fluid to keep your pee (urine) pale yellow. General instructions  Take over-the-counter and prescription medicines only as told by your doctor.  If you are taking blood pressure or heart medicine, sit up and stand up slowly. Spend a few minutes getting ready to sit and then stand. This can help you feel less dizzy.  Have someone stay with you until you feel stable.  If you start to feel like you might pass out, lie down right away and raise (elevate) your feet above the level of your heart. Breathe deeply and steadily. Wait until all of the symptoms are gone.  Keep all follow-up visits as told by your doctor. This is important. Get help right away if:  You have a very bad headache.  You pass out once or more than once.  You have pain in your chest, belly, or back.  You have a very fast or uneven heartbeat (palpitations).  It hurts to breathe.  You are bleeding from your mouth or your bottom (rectum).  You have black or tarry poop (stool).  You have jerky movements that you cannot control (seizure).  You are confused.  You have  trouble walking.  You are very weak.  You have vision problems. These symptoms may be an emergency. Do not wait to see if the symptoms will go away. Get medical help right away. Call your local emergency services (911 in the [place] Do not drive yourself to the hospital. Summary  Syncope is when you pass out (faint) for a short time. It is caused by a sudden decrease in blood flow to the brain.  Signs that you may be about to faint include feeling dizzy, light-headed, or sick to your stomach, seeing all white or all black, or having cold, clammy skin.  If you start to feel like you might pass out, lie down right away and raise (elevate) your feet above the level of your heart. Breathe deeply and steadily. Wait until all of the symptoms are gone. This information is not intended to replace advice given to you by your health care provider. Make sure you discuss any questions you have with your health care provider. Document Revised: 05/16/2017 Document Reviewed: 05/16/2017 Elsevier Patient Education  2020 Elsevier Inc.  

## 2019-09-10 NOTE — Progress Notes (Signed)
Nsg Discharge Note  Patient to be discharged home. AVS instructions reviewed and questions answered.   Admit Date:  09/07/2019 Discharge date: 09/10/2019   [name] to be D/C'd Home per MD order.  AVS completed.  Copy for chart, and copy for patient signed, and dated. Patient/caregiver able to verbalize understanding.  Discharge Medication: Allergies as of 09/10/2019   No Known Allergies     Medication List    STOP taking these medications   cilostazol 50 MG tablet Commonly known as: PLETAL   metoprolol succinate 50 MG 24 hr tablet Commonly known as: TOPROL-XL     TAKE these medications   aspirin EC 81 MG tablet Take 81 mg by mouth daily.   atorvastatin 80 MG tablet Commonly known as: LIPITOR Take 1 tablet (80 mg total) by mouth daily at 6 PM.   Centrum Silver 50+Men Tabs Take 1 tablet by mouth daily.   diphenhydrAMINE 25 MG tablet Commonly known as: BENADRYL Take 25 mg by mouth at bedtime as needed for sleep.   ibuprofen 200 MG tablet Commonly known as: ADVIL Take 200 mg by mouth daily as needed for moderate pain.   loratadine 10 MG tablet Commonly known as: CLARITIN Take 10 mg by mouth daily.   nitroGLYCERIN 0.4 MG SL tablet Commonly known as: NITROSTAT Place 1 tablet (0.4 mg total) under the tongue every 5 (five) minutes x 3 doses as needed for chest pain.   ramipril 10 MG capsule Commonly known as: ALTACE Take 10 mg by mouth daily.   sodium chloride 0.65 % Soln nasal spray Commonly known as: OCEAN Place 1 spray into both nostrils as needed for congestion.       Discharge Assessment: Vitals:   09/10/19 0527 09/10/19 1009  BP: 104/70 124/82  Pulse: 92 95  Resp: 18 18  Temp: 98.6 F (37 C) 98.7 F (37.1 C)  SpO2: 95% 97%   Skin clean, dry and intact without evidence of skin break down, no evidence of skin tears noted. IV catheter discontinued intact. Site without signs and symptoms of complications - no redness or edema noted at insertion  site, patient denies c/o pain - only slight tenderness at site.  Dressing with slight pressure applied.  D/c Instructions-Education: Discharge instructions given to patient/family with verbalized understanding. D/c education completed with patient/family including follow up instructions, medication list, d/c activities limitations if indicated, with other d/c instructions as indicated by MD - patient able to verbalize understanding, all questions fully answered. Patient instructed to return to ED, call 911, or call MD for any changes in condition.  Patient escorted via WC, and D/C home via private auto.  Boykin Nearing, RN 09/10/2019 12:07 PM

## 2019-09-10 NOTE — Telephone Encounter (Signed)
Currently admitted. TOC call 5/27

## 2019-09-10 NOTE — Telephone Encounter (Signed)
Patient has TOC appt with Edd Fabian on 09/22/19 at 11:15am.

## 2019-09-10 NOTE — Discharge Summary (Signed)
Physician Discharge Summary  Dreshawn Hendershott FYB:[id number] DOB: 1946/02/12 DOA: 09/07/2019  PCP: Patient, No Pcp Per  Admit date: 09/07/2019 Discharge date: 09/10/2019  Admitted From: Home Disposition: Home  Recommendations for Outpatient Follow-up:  1. Follow up with PCP in 1-2 weeks 2. Follow with cardiology as scheduled 3. Please obtain BMP/CBC in one week 4. Please follow up on the following pending results:  Home Health: None Equipment/Devices: None  Discharge Condition: Stable CODE STATUS: Full code Diet recommendation: Cardiac  Subjective: Seen and examined.  Feels much better.  No more dizziness, chest pain or any other complaint.  Brief/Interim Summary: 74 year old medical history significant for CAD status post RCA and LAD stenting 2007 and 3 interventions back in 2017, hypertension, hyperlipidemia, PAD who presents after 2 syncope episode. Patient states 5 days ago he was out on a walk with his wife and all of a sudden blacked out.  No preceding lightheadedness/dizziness, chest pain, palpitations, or shortness of breath.  He was told that he was unconscious for a few seconds and after waking felt slightly short of breath.  His watch told him that his pulse was 50.  He then went home and felt okay for the next few days. On the day of admission, he was feeling dizzy all day.  Around dinnertime this worsened and he felt a little nauseous.  He then went to lay down in the bed and soon after while still talking to his wife passed out again.  He believes he was unconscious just for a few minutes again.  When EMS arrived they checked his heart rate and he was told that it was going anywhere between 50s to 90s.  Upon arrival to ED, Rhythm strip from EMS at bedside showing heart rate ranging from 40s to 90s.  Afebrile.  Heart rate in the 80-100 range in the ED.  Not hypotensive.  Labs showing no leukocytosis.  Initial high-sensitivity troponin 18, repeat 44.  UA not suggestive of infection.   Patient was admitted under hospital service for syncope work-up and cardiology was consulted.  Patient's Toprol-XL was discontinued and patient underwent Myoview which was unremarkable with no wall motion abnormality.  Cardiology note, his EMS strips also appeared to have 2-1 block with PR prolonged.  Cardiology also stopped his Pletal.  His ejection fraction was normal.  During short course of hospitalization, he remained asymptomatic.  He was seen by cardiology again today and they cleared patient for discharge and advised to discontinue his Toprol-XL and Pletal and they will arrange 30-day event monitor for him as outpatient and he will follow with Dr. Allyson Sabal of cardiology.  Patient very well aware of that plan and is agreeable with discharge plan.  Discharge Diagnoses:  Principal Problem:   Syncope Active Problems:   PAD (peripheral artery disease) (HCC)   Hypertension   CAD (coronary artery disease)   Hyperlipidemia    Discharge Instructions   Allergies as of 09/10/2019   No Known Allergies     Medication List    STOP taking these medications   cilostazol 50 MG tablet Commonly known as: PLETAL   metoprolol succinate 50 MG 24 hr tablet Commonly known as: TOPROL-XL     TAKE these medications   aspirin EC 81 MG tablet Take 81 mg by mouth daily.   atorvastatin 80 MG tablet Commonly known as: LIPITOR Take 1 tablet (80 mg total) by mouth daily at 6 PM.   Centrum Silver 50+Men Tabs Take 1 tablet by mouth daily.  diphenhydrAMINE 25 MG tablet Commonly known as: BENADRYL Take 25 mg by mouth at bedtime as needed for sleep.   ibuprofen 200 MG tablet Commonly known as: ADVIL Take 200 mg by mouth daily as needed for moderate pain.   loratadine 10 MG tablet Commonly known as: CLARITIN Take 10 mg by mouth daily.   nitroGLYCERIN 0.4 MG SL tablet Commonly known as: NITROSTAT Place 1 tablet (0.4 mg total) under the tongue every 5 (five) minutes x 3 doses as needed for chest  pain.   ramipril 10 MG capsule Commonly known as: ALTACE Take 10 mg by mouth daily.   sodium chloride 0.65 % Soln nasal spray Commonly known as: OCEAN Place 1 spray into both nostrils as needed for congestion.      Follow-up Information    Runell Gess, MD Follow up in 1 week(s).   Specialties: Cardiology, Radiology Contact information: [address] [place] [address] [phone]          No Known Allergies  Consultations: Cardiology   Procedures/Studies: X-ray chest PA and lateral  Result Date: 09/08/2019 CLINICAL DATA:  Syncopal episode EXAM: CHEST - 2 VIEW COMPARISON:  Radiograph 09/27/2018, CT 10/27/2018 FINDINGS: Some streaky atelectatic changes are present in the lung bases on background of more chronically coarsened interstitium. No focal consolidative opacity. No pneumothorax or effusion. Normal cardiac size. The aorta is calcified. The remaining cardiomediastinal contours are unremarkable. No acute osseous or soft tissue abnormality. Degenerative changes are present in the imaged spine and shoulders. Telemetry leads overlie the chest. IMPRESSION: Streaky atelectatic changes, otherwise no acute cardiopulmonary abnormality. Aortic Atherosclerosis (ICD10-I70.0). Electronically Signed   By: Kreg Shropshire M.D.   On: 09/08/2019 04:56   CT HEAD WO CONTRAST  Result Date: 09/08/2019 CLINICAL DATA:  Syncope EXAM: CT HEAD WITHOUT CONTRAST TECHNIQUE: Contiguous axial images were obtained from the base of the skull through the vertex without intravenous contrast. COMPARISON:  None. FINDINGS: Brain: No evidence of acute infarction, hemorrhage, hydrocephalus, extra-axial collection or mass lesion/mass effect. Symmetric prominence of the ventricles, cisterns and sulci compatible with parenchymal volume loss. Patchy areas of white matter hypoattenuation are most compatible with chronic microvascular angiopathy. Vascular: Atherosclerotic calcification of the carotid  siphons and intradural vertebral arteries. No hyperdense vessel. Skull: No calvarial fracture or suspicious osseous lesion. No scalp swelling or hematoma. Sinuses/Orbits: Paranasal sinuses and mastoid air cells are predominantly clear. Included orbital structures are unremarkable. Other: Edentulous with dental prostheses. IMPRESSION: 1. No acute intracranial abnormality. 2. Generalized parenchymal volume loss and chronic microvascular ischemic white matter disease. Electronically Signed   By: Kreg Shropshire M.D.   On: 09/08/2019 04:59   CT ANGIO CHEST PE W OR WO CONTRAST  Result Date: 09/08/2019 CLINICAL DATA:  Recurrent syncope, elevated D-dimer, tachycardia EXAM: CT ANGIOGRAPHY CHEST WITH CONTRAST TECHNIQUE: Multidetector CT imaging of the chest was performed using the standard protocol during bolus administration of intravenous contrast. Multiplanar CT image reconstructions and MIPs were obtained to evaluate the vascular anatomy. CONTRAST:  53mL OMNIPAQUE IOHEXOL 350 MG/ML SOLN COMPARISON:  10/27/2018 FINDINGS: Cardiovascular: This is a technically adequate evaluation of the pulmonary vasculature. There are no filling defects or pulmonary emboli. Heart is unremarkable without pericardial effusion. Normal caliber of the thoracic aorta. Severe atherosclerosis of the aorta and coronary vasculature unchanged. Mediastinum/Nodes: No enlarged mediastinal, hilar, or axillary lymph nodes. Thyroid gland, trachea, and esophagus demonstrate no significant findings. Lungs/Pleura: Calcified granuloma right lower lobe. No airspace disease, effusion, or pneumothorax. The central airways are  patent. Upper Abdomen: Punctate calcified gallstone is identified. Otherwise the upper abdomen is unremarkable. Musculoskeletal: No acute or destructive bony lesions. Reconstructed images demonstrate no additional findings. Review of the MIP images confirms the above findings. IMPRESSION: 1. No CT evidence of pulmonary embolism. 2.  Cholelithiasis. 3. Aortic Atherosclerosis (ICD10-I70.0). Electronically Signed   By: Randa Ngo M.D.   On: 09/08/2019 19:18   NM Myocar Multi W/Spect W/Wall Motion / EF  Result Date: 09/09/2019 CLINICAL DATA:  Status post RCA and LAD stent. EXAM: MYOCARDIAL IMAGING WITH SPECT (REST AND PHARMACOLOGIC-STRESS) GATED LEFT VENTRICULAR WALL MOTION STUDY LEFT VENTRICULAR EJECTION FRACTION TECHNIQUE: Standard myocardial SPECT imaging was performed after resting intravenous injection of 10.3 mCi Tc-20m tetrofosmin. Subsequently, intravenous infusion of Lexiscan was performed under the supervision of the Cardiology staff. At peak effect of the drug, 32.6 mCi Tc-56m tetrofosmin was injected intravenously and standard myocardial SPECT imaging was performed. Quantitative gated imaging was also performed to evaluate left ventricular wall motion, and estimate left ventricular ejection fraction. COMPARISON:  CTA chest of 1 day prior FINDINGS: Perfusion: No decreased activity in the left ventricle on stress imaging to suggest reversible ischemia or infarction. Wall Motion: Inferior wall hypokinesis. Left Ventricular Ejection Fraction: 73 % End diastolic volume 623 ml End systolic volume 66 ml IMPRESSION: 1. No reversible ischemia or infarction. 2. Inferior wall hypokinesis. 3. Left ventricular ejection fraction 73% 4. Non invasive risk stratification*: Low *2012 Appropriate Use Criteria for Coronary Revascularization Focused Update: J Am Coll Cardiol. 7628;31(5):176-160. http://content.airportbarriers.com.aspx?articleid=1201161 Electronically Signed   By: Abigail Miyamoto M.D.   On: 09/09/2019 16:50   ECHOCARDIOGRAM COMPLETE  Result Date: 09/08/2019    ECHOCARDIOGRAM REPORT   Patient Name:   Glenn Collins Date of Exam: 09/08/2019 Medical Rec #:  [id number]      Height:       72.0 in Accession #:    [id number]     Weight:       205.0 lb Date of Birth:  [birth year]      BSA:          2.153 m Patient Age:    74 years       BP:            118/79 mmHg Patient Gender: M              HR:           92 bpm. Exam Location:  Inpatient Procedure: 2D Echo, Cardiac Doppler and Color Doppler Indications:    Syncope  History:        Patient has prior history of Echocardiogram examinations, most                 recent 10/28/2018. CAD and Previous Myocardial Infarction, Aortic                 Valve Disease; Risk Factors:Hypertension, Dyslipidemia and                 Former Smoker.  Sonographer:    Clayton Lefort RDCS (AE) Referring Phys: [id number] [facility]  Sonographer Comments: No subcostal window. IMPRESSIONS  1. Left ventricular ejection fraction, by estimation, is 55 to 60%. The left ventricle has normal function. The left ventricle has no regional wall motion abnormalities. Left ventricular diastolic parameters are consistent with Grade I diastolic dysfunction (impaired relaxation).  2. Right ventricular systolic function is normal. The right ventricular size is normal.  3. The mitral valve is grossly normal. No evidence of mitral valve  regurgitation. No evidence of mitral stenosis.  4. The aortic valve is tricuspid. Aortic valve regurgitation is not visualized. Mild aortic valve stenosis.  5. Aortic dilatation noted. There is mild dilatation of the ascending aorta measuring 39 mm. FINDINGS  Left Ventricle: Left ventricular ejection fraction, by estimation, is 55 to 60%. The left ventricle has normal function. The left ventricle has no regional wall motion abnormalities. The left ventricular internal cavity size was normal in size. There is  no left ventricular hypertrophy. Left ventricular diastolic parameters are consistent with Grade I diastolic dysfunction (impaired relaxation). Right Ventricle: The right ventricular size is normal. No increase in right ventricular wall thickness. Right ventricular systolic function is normal. Left Atrium: Left atrial size was normal in size. Right Atrium: Right atrial size was normal in size. Pericardium:  There is no evidence of pericardial effusion. Mitral Valve: The mitral valve is grossly normal. No evidence of mitral valve regurgitation. No evidence of mitral valve stenosis. Tricuspid Valve: The tricuspid valve is normal in structure. Tricuspid valve regurgitation is not demonstrated. Aortic Valve: The aortic valve is tricuspid. . There is mild thickening and mild calcification of the aortic valve. Aortic valve regurgitation is not visualized. Mild aortic stenosis is present. Mild aortic valve annular calcification. There is mild thickening of the aortic valve. There is mild calcification of the aortic valve. Aortic valve mean gradient measures 8.8 mmHg. Aortic valve peak gradient measures 15.2 mmHg. Aortic valve area, by VTI measures 1.20 cm. Pulmonic Valve: The pulmonic valve was grossly normal. Pulmonic valve regurgitation is not visualized. Aorta: Aortic dilatation noted. There is mild dilatation of the ascending aorta measuring 39 mm. IAS/Shunts: The atrial septum is grossly normal.  LEFT VENTRICLE PLAX 2D LVIDd:         4.10 cm  Diastology LVIDs:         2.70 cm  LV e' lateral:   7.18 cm/s LV PW:         1.50 cm  LV E/e' lateral: 5.1 LV IVS:        1.40 cm  LV e' medial:    4.57 cm/s LVOT diam:     2.10 cm  LV E/e' medial:  8.0 LV SV:         42 LV SV Index:   20 LVOT Area:     3.46 cm  RIGHT VENTRICLE RV Basal diam:  2.90 cm RV S prime:     16.40 cm/s TAPSE (M-mode): 3.0 cm LEFT ATRIUM             Index       RIGHT ATRIUM           Index LA diam:        2.60 cm 1.21 cm/m  RA Area:     20.90 cm LA Vol (A2C):   56.5 ml 26.24 ml/m RA Volume:   56.70 ml  26.33 ml/m LA Vol (A4C):   53.5 ml 24.85 ml/m LA Biplane Vol: 59.4 ml 27.59 ml/m  AORTIC VALVE AV Area (Vmax):    1.31 cm AV Area (Vmean):   1.24 cm AV Area (VTI):     1.20 cm AV Vmax:           194.75 cm/s AV Vmean:          140.250 cm/s AV VTI:            0.354 m AV Peak Grad:      15.2 mmHg AV Mean Grad:  8.8 mmHg LVOT Vmax:         73.83  cm/s LVOT Vmean:        50.133 cm/s LVOT VTI:          0.123 m LVOT/AV VTI ratio: 0.35  AORTA Ao Root diam: 3.90 cm MITRAL VALVE MV Area (PHT): 6.71 cm    SHUNTS MV Decel Time: 113 msec    Systemic VTI:  0.12 m MV E velocity: 36.70 cm/s  Systemic Diam: 2.10 cm MV A velocity: 96.70 cm/s MV E/A ratio:  0.38 Kristeen Miss MD Electronically signed by Kristeen Miss MD Signature Date/Time: 09/08/2019/5:13:57 PM    Final    VAS [place] LOWER EXTREMITY VENOUS (DVT)  Result Date: 09/08/2019  Lower Venous DVT Study Indications: Syncope, elevated D-dimer.  Risk Factors: History of PAD. Limitations: Shadowing from calcified arteries in bilateral thighs. Comparison Study: No prior study on file for comparison Performing Technologist: Sherren Kerns RVS  Examination Guidelines: A complete evaluation includes B-mode imaging, spectral Doppler, color Doppler, and power Doppler as needed of all accessible portions of each vessel. Bilateral testing is considered an integral part of a complete examination. Limited examinations for reoccurring indications may be performed as noted. The reflux portion of the exam is performed with the patient in reverse Trendelenburg.  +---------+---------------+---------+-----------+----------+--------------+ RIGHT    CompressibilityPhasicitySpontaneityPropertiesThrombus Aging +---------+---------------+---------+-----------+----------+--------------+ CFV      Full           Yes      Yes                                 +---------+---------------+---------+-----------+----------+--------------+ SFJ      Full                                                        +---------+---------------+---------+-----------+----------+--------------+ FV Prox  Full                                                        +---------+---------------+---------+-----------+----------+--------------+ FV Mid   Full                                                         +---------+---------------+---------+-----------+----------+--------------+ FV DistalFull                                                        +---------+---------------+---------+-----------+----------+--------------+ PFV      Full                                                        +---------+---------------+---------+-----------+----------+--------------+ POP      Full  Yes      Yes                                 +---------+---------------+---------+-----------+----------+--------------+ PTV      Full                                                        +---------+---------------+---------+-----------+----------+--------------+ PERO     Full                                                        +---------+---------------+---------+-----------+----------+--------------+   +---------+---------------+---------+-----------+----------+--------------+ LEFT     CompressibilityPhasicitySpontaneityPropertiesThrombus Aging +---------+---------------+---------+-----------+----------+--------------+ CFV      Full           Yes      Yes                                 +---------+---------------+---------+-----------+----------+--------------+ SFJ      Full                                                        +---------+---------------+---------+-----------+----------+--------------+ FV Prox  Full                                                        +---------+---------------+---------+-----------+----------+--------------+ FV Mid   Full                                                        +---------+---------------+---------+-----------+----------+--------------+ FV DistalFull                                                        +---------+---------------+---------+-----------+----------+--------------+ PFV      Full                                                         +---------+---------------+---------+-----------+----------+--------------+ POP      Full           Yes      Yes                                 +---------+---------------+---------+-----------+----------+--------------+ PTV  Full                                                        +---------+---------------+---------+-----------+----------+--------------+ PERO     Full                                                        +---------+---------------+---------+-----------+----------+--------------+     Summary: BILATERAL: - No evidence of deep vein thrombosis seen in the lower extremities, bilaterally. -   *See table(s) above for measurements and observations. Electronically signed by Fabienne Bruns MD on 09/08/2019 at 6:34:03 PM.    Final       Discharge Exam: Vitals:   09/10/19 0527 09/10/19 1009  BP: 104/70 124/82  Pulse: 92 95  Resp: 18 18  Temp: 98.6 F (37 C) 98.7 F (37.1 C)  SpO2: 95% 97%   Vitals:   09/09/19 2230 09/10/19 0007 09/10/19 0527 09/10/19 1009  BP:  121/77 104/70 124/82  Pulse: (!) 105 89 92 95  Resp:  Temp:  97.9 F (36.6 C) 98.6 F (37 C) 98.7 F (37.1 C)  TempSrc:  Oral Oral Oral  SpO2:  95% 95% 97%  Weight:   90.5 kg   Height:        General: Pt is alert, awake, not in acute distress Cardiovascular: RRR, S1/S2 +, no rubs, no gallops Respiratory: CTA bilaterally, no wheezing, no rhonchi Abdominal: Soft, NT, ND, bowel sounds + Extremities: no edema, no cyanosis    The results of significant diagnostics from this hospitalization (including imaging, microbiology, ancillary and laboratory) are listed below for reference.     Microbiology: Recent Results (from the past 240 hour(s))  SARS Coronavirus 2 by RT PCR (hospital order, performed in [facility] hospital lab) Nasopharyngeal Nasopharyngeal Swab     Status: None   Collection Time: 09/08/19  2:47 AM   Specimen: Nasopharyngeal Swab  Result Value Ref Range Status    SARS Coronavirus 2 NEGATIVE NEGATIVE Final    Comment: (NOTE) SARS-CoV-2 target nucleic acids are NOT DETECTED. The SARS-CoV-2 RNA is generally detectable in upper and lower respiratory specimens during the acute phase of infection. The lowest concentration of SARS-CoV-2 viral copies this assay can detect is 250 copies / mL. A negative result does not preclude SARS-CoV-2 infection and should not be used as the sole basis for treatment or other patient management decisions.  A negative result may occur with improper specimen collection / handling, submission of specimen other than nasopharyngeal swab, presence of viral mutation(s) within the areas targeted by this assay, and inadequate number of viral copies (<250 copies / mL). A negative result must be combined with clinical observations, patient history, and epidemiological information. Fact Sheet for Patients:   BoilerBrush.com.cy Fact Sheet for Healthcare Providers: https://pope.com/ This test is not yet approved or cleared  by the [place] FDA and has been authorized for detection and/or diagnosis of SARS-CoV-2 by FDA under an Emergency Use Authorization (EUA).  This EUA will remain in effect (meaning this test can be used) for the duration of the COVID-19 declaration under Section 564(b)(1) of the Act, 21 U.S.C. section 360bbb-3(b)(1),  unless the authorization is terminated or revoked sooner. Performed at [facility], [address] [place], [place] [address]      Labs: BNP (last 3 results) Recent Labs    10/28/18 0012  BNP 298.0*   Basic Metabolic Panel: Recent Labs  Lab 09/08/19 0012 09/08/19 0942 09/09/19 0530  NA 135  --  137  K 4.2  --  3.9  CL 96*  --  103  CO2 25  --  26  GLUCOSE 109*  --  108*  BUN 13  --  12  CREATININE 0.98  --  0.82  CALCIUM 10.0  --  9.2  MG  --  2.1  --    Liver Function Tests: No results for input(s): AST, ALT,  ALKPHOS, BILITOT, PROT, ALBUMIN in the last 168 hours. No results for input(s): LIPASE, AMYLASE in the last 168 hours. No results for input(s): AMMONIA in the last 168 hours. CBC: Recent Labs  Lab 09/08/19 0012  WBC 7.6  HGB 13.9  HCT 40.0  MCV 92.2  PLT 192   Cardiac Enzymes: No results for input(s): CKTOTAL, CKMB, CKMBINDEX, TROPONINI in the last 168 hours. BNP: Invalid input(s): POCBNP CBG: Recent Labs  Lab 09/08/19 0059 09/08/19 0530 09/09/19 0613 09/10/19 0622  GLUCAP 111* 103* 101* 100*   D-Dimer Recent Labs    09/08/19 0541  DDIMER 0.77*   Hgb A1c No results for input(s): HGBA1C in the last 72 hours. Lipid Profile No results for input(s): CHOL, HDL, LDLCALC, TRIG, CHOLHDL, LDLDIRECT in the last 72 hours. Thyroid function studies No results for input(s): TSH, T4TOTAL, T3FREE, THYROIDAB in the last 72 hours.  Invalid input(s): FREET3 Anemia work up No results for input(s): VITAMINB12, FOLATE, FERRITIN, TIBC, IRON, RETICCTPCT in the last 72 hours. Urinalysis    Component Value Date/Time   COLORURINE STRAW (A) 09/08/2019 0131   APPEARANCEUR CLEAR 09/08/2019 0131   LABSPEC 1.004 (L) 09/08/2019 0131   PHURINE 5.0 09/08/2019 0131   GLUCOSEU NEGATIVE 09/08/2019 0131   HGBUR NEGATIVE 09/08/2019 0131   BILIRUBINUR NEGATIVE 09/08/2019 0131   KETONESUR 5 (A) 09/08/2019 0131   PROTEINUR NEGATIVE 09/08/2019 0131   NITRITE NEGATIVE 09/08/2019 0131   LEUKOCYTESUR NEGATIVE 09/08/2019 0131   Sepsis Labs Invalid input(s): PROCALCITONIN,  WBC,  LACTICIDVEN Microbiology Recent Results (from the past 240 hour(s))  SARS Coronavirus 2 by RT PCR (hospital order, performed in [facility] lab) Nasopharyngeal Nasopharyngeal Swab     Status: None   Collection Time: 09/08/19  2:47 AM   Specimen: Nasopharyngeal Swab  Result Value Ref Range Status   SARS Coronavirus 2 NEGATIVE NEGATIVE Final    Comment: (NOTE) SARS-CoV-2 target nucleic acids are NOT DETECTED. The  SARS-CoV-2 RNA is generally detectable in upper and lower respiratory specimens during the acute phase of infection. The lowest concentration of SARS-CoV-2 viral copies this assay can detect is 250 copies / mL. A negative result does not preclude SARS-CoV-2 infection and should not be used as the sole basis for treatment or other patient management decisions.  A negative result may occur with improper specimen collection / handling, submission of specimen other than nasopharyngeal swab, presence of viral mutation(s) within the areas targeted by this assay, and inadequate number of viral copies (<250 copies / mL). A negative result must be combined with clinical observations, patient history, and epidemiological information. Fact Sheet for Patients:   BoilerBrush.com.cyhttps://www.fda.gov/media/136312/download Fact Sheet for Healthcare Providers: https://pope.com/https://www.fda.gov/media/136313/download This test is not yet approved or cleared  by the [place] and has been authorized for detection and/or diagnosis of SARS-CoV-2 by FDA under an Emergency Use Authorization (EUA).  This EUA will remain in effect (meaning this test can be used) for the duration of the COVID-19 declaration under Section 564(b)(1) of the Act, 21 U.S.C. section 360bbb-3(b)(1), unless the authorization is terminated or revoked sooner. Performed at [facility], [address] [place], [place] [address]      Time coordinating discharge: Over 30 minutes  SIGNED:   Hughie Closs, MD  [facility] 09/10/2019, 11:31 AM  If 7PM-7AM, please contact night-coverage [email or web address]

## 2019-09-11 NOTE — Telephone Encounter (Signed)
Patient contacted regarding discharge from [facility] on 09/10/19.  Patient understands to follow up with provider Edd Fabian, NP on 09/22/19 at 11:15 AM at [facility]. Patient understands discharge instructions? yes Patient understands medications and regiment? yes Patient understands to bring all medications to this visit? yes

## 2019-09-20 ENCOUNTER — Inpatient Hospital Stay (INDEPENDENT_AMBULATORY_CARE_PROVIDER_SITE_OTHER): Payer: Medicare PPO

## 2019-09-20 DIAGNOSIS — R55 Syncope and collapse: Secondary | ICD-10-CM | POA: Diagnosis not present

## 2019-09-20 DIAGNOSIS — R001 Bradycardia, unspecified: Secondary | ICD-10-CM

## 2019-09-22 ENCOUNTER — Emergency Department (HOSPITAL_COMMUNITY)
Admission: EM | Admit: 2019-09-22 | Discharge: 2019-09-22 | Disposition: A | Discharge status: Home / Self Care | Payer: Medicare PPO | Attending: Emergency Medicine | Admitting: Emergency Medicine

## 2019-09-22 ENCOUNTER — Encounter (HOSPITAL_COMMUNITY): Payer: Self-pay | Admitting: Emergency Medicine

## 2019-09-22 ENCOUNTER — Other Ambulatory Visit: Payer: Self-pay

## 2019-09-22 ENCOUNTER — Ambulatory Visit: Payer: Medicare PPO | Admitting: General Practice

## 2019-09-22 ENCOUNTER — Emergency Department (HOSPITAL_COMMUNITY): Payer: Medicare PPO

## 2019-09-22 DIAGNOSIS — I252 Old myocardial infarction: Secondary | ICD-10-CM | POA: Insufficient documentation

## 2019-09-22 DIAGNOSIS — Z87891 Personal history of nicotine dependence: Secondary | ICD-10-CM | POA: Insufficient documentation

## 2019-09-22 DIAGNOSIS — R42 Dizziness and giddiness: Secondary | ICD-10-CM | POA: Insufficient documentation

## 2019-09-22 DIAGNOSIS — I2510 Atherosclerotic heart disease of native coronary artery without angina pectoris: Secondary | ICD-10-CM | POA: Diagnosis not present

## 2019-09-22 DIAGNOSIS — R072 Precordial pain: Secondary | ICD-10-CM | POA: Diagnosis not present

## 2019-09-22 DIAGNOSIS — R0602 Shortness of breath: Secondary | ICD-10-CM | POA: Diagnosis not present

## 2019-09-22 DIAGNOSIS — I739 Peripheral vascular disease, unspecified: Secondary | ICD-10-CM | POA: Diagnosis not present

## 2019-09-22 DIAGNOSIS — Z7982 Long term (current) use of aspirin: Secondary | ICD-10-CM | POA: Diagnosis not present

## 2019-09-22 DIAGNOSIS — R0789 Other chest pain: Secondary | ICD-10-CM | POA: Diagnosis present

## 2019-09-22 DIAGNOSIS — Z79899 Other long term (current) drug therapy: Secondary | ICD-10-CM | POA: Insufficient documentation

## 2019-09-22 DIAGNOSIS — I10 Essential (primary) hypertension: Secondary | ICD-10-CM | POA: Diagnosis not present

## 2019-09-22 LAB — BASIC METABOLIC PANEL
Anion gap: 11 (ref 5–15)
BUN: 18 mg/dL (ref 8–23)
CO2: 25 mmol/L (ref 22–32)
Calcium: 9.3 mg/dL (ref 8.9–10.3)
Chloride: 99 mmol/L (ref 98–111)
Creatinine, Ser: 1.01 mg/dL (ref 0.61–1.24)
GFR calc Af Amer: >60 mL/min (ref >60)
GFR calc non Af Amer: >60 mL/min (ref >60)
Glucose, Bld: 106 mg/dL — ABNORMAL HIGH (ref 70–99)
Potassium: 4.2 mmol/L (ref 3.5–5.1)
Sodium: 135 mmol/L (ref 135–145)

## 2019-09-22 LAB — CBC WITH DIFFERENTIAL/PLATELET
Abs Immature Granulocytes: 0.03 10*3/uL (ref 0.00–0.07)
Basophils Absolute: 0 10*3/uL (ref 0.0–0.1)
Basophils Relative: 0 %
Eosinophils Absolute: 0.1 10*3/uL (ref 0.0–0.5)
Eosinophils Relative: 2 %
HCT: 38.4 % — ABNORMAL LOW (ref 39.0–52.0)
Hemoglobin: 13.1 g/dL (ref 13.0–17.0)
Immature Granulocytes: 0 %
Lymphocytes Relative: 12 %
Lymphs Abs: 0.9 10*3/uL (ref 0.7–4.0)
MCH: 31.7 pg (ref 26.0–34.0)
MCHC: 34.1 g/dL (ref 30.0–36.0)
MCV: 93 fL (ref 80.0–100.0)
Monocytes Absolute: 0.9 10*3/uL (ref 0.1–1.0)
Monocytes Relative: 13 %
Neutro Abs: 5.4 10*3/uL (ref 1.7–7.7)
Neutrophils Relative %: 73 %
Platelets: 251 10*3/uL (ref 150–400)
RBC: 4.13 MIL/uL — ABNORMAL LOW (ref 4.22–5.81)
RDW: 13.1 % (ref 11.5–15.5)
WBC: 7.4 10*3/uL (ref 4.0–10.5)
nRBC: 0 % (ref 0.0–0.2)

## 2019-09-22 LAB — TROPONIN I (HIGH SENSITIVITY)
Troponin I (High Sensitivity): 11 ng/L (ref <18)
Troponin I (High Sensitivity): 11 ng/L (ref <18)
Troponin I (High Sensitivity): 11 ng/L (ref <18)

## 2019-09-22 NOTE — ED Provider Notes (Signed)
[facility] EMERGENCY DEPARTMENT Provider Note   CSN: [id number] Arrival date & time: 09/22/19  0044     History Chief Complaint  Patient presents with  . Chest Pain    Quillan Whitter is a 74 y.o. male.  The history is provided by the patient.  Chest Pain Pain location:  Substernal area Pain quality: no pressure   Pain radiates to:  Does not radiate Pain severity:  Severe Onset quality:  Sudden Duration:  15 minutes Timing:  Constant Progression:  Resolved Chronicity:  New Relieved by:  Aspirin and nitroglycerin Worsened by:  Nothing Associated symptoms: dizziness and shortness of breath   Associated symptoms: no abdominal pain, no diaphoresis, no fever, no lower extremity edema, no syncope and no vomiting   Risk factors: coronary artery disease, hypertension and male sex   Patient reports he had onset of chest pain after walking to the bathroom.  He reports he felt short of breath.  No diaphoresis or vomiting.  Patient took aspirin/ nitroglycerin with full relief of his pain.  He does report recent lightheadedness.  No syncope.  Patient recently admitted and evaluated for syncope and is wearing an a cardiac monitor at home     Past Medical History:  Diagnosis Date  . CAD (coronary artery disease) 10/27/2018  . Coronary artery disease   . Hypertension   . PAD (peripheral artery disease) (HCC) 10/27/2018    Patient Active Problem List   Diagnosis Date Noted  . Syncope 09/08/2019  . Hyperlipidemia 04/22/2019  . Bilateral carotid bruits 12/24/2018  . PAD (peripheral artery disease) (HCC) 10/27/2018  . Hypertension 10/27/2018  . CAD (coronary artery disease) 10/27/2018  . NSTEMI (non-ST elevated myocardial infarction) 10/27/2018  . Acute back pain 10/27/2018    Past Surgical History:  Procedure Laterality Date  . CARDIAC SURGERY         Family History  Problem Relation Age of Onset  . Cancer Mother   . CVA Father     Social History    Tobacco Use  . Smoking status: Former Games developer  . Smokeless tobacco: Never Used  Substance Use Topics  . Alcohol use: Yes  . Drug use: Never    Home Medications Prior to Admission medications   Medication Sig Start Date End Date Taking? Authorizing Provider  aspirin EC 81 MG tablet Take 81 mg by mouth daily.    [provider]  atorvastatin (LIPITOR) 80 MG tablet Take 1 tablet (80 mg total) by mouth daily at 6 PM. 04/03/19   Arty Baumgartner, NP  diphenhydrAMINE (BENADRYL) 25 MG tablet Take 25 mg by mouth at bedtime as needed for sleep.    [provider]  ibuprofen (ADVIL) 200 MG tablet Take 200 mg by mouth daily as needed for moderate pain.    [provider]  loratadine (CLARITIN) 10 MG tablet Take 10 mg by mouth daily.    [provider]  Multiple Vitamins-Minerals (CENTRUM SILVER 50+MEN) TABS Take 1 tablet by mouth daily.    [provider]  nitroGLYCERIN (NITROSTAT) 0.4 MG SL tablet Place 1 tablet (0.4 mg total) under the tongue every 5 (five) minutes x 3 doses as needed for chest pain. 10/29/18   Arty Baumgartner, NP  ramipril (ALTACE) 10 MG capsule Take 10 mg by mouth daily.    [provider]  sodium chloride (OCEAN) 0.65 % SOLN nasal spray Place 1 spray into both nostrils as needed for congestion.    [provider]    Allergies    Patient has no known allergies.  Review of Systems   Review of Systems  Constitutional: Negative for diaphoresis and fever.  Respiratory: Positive for shortness of breath.   Cardiovascular: Positive for chest pain. Negative for syncope.  Gastrointestinal: Negative for abdominal pain and vomiting.  Neurological: Positive for dizziness. Negative for syncope.  All other systems reviewed and are negative.   Physical Exam Updated Vital Signs BP 138/68 (BP Location: Right Arm)   Pulse (!) 102   Temp 97.6 F (36.4 C) (Oral)   Resp 18   SpO2 99%   Physical  Exam CONSTITUTIONAL: Well developed/well nourished HEAD: Normocephalic/atraumatic EYES: EOMI/PERRL ENMT: Mucous membranes moist NECK: supple no meningeal signs SPINE/BACK:entire spine nontender CV: S1/S2 noted, no murmurs/rubs/gallops noted LUNGS: Lungs are clear to auscultation bilaterally, no apparent distress ABDOMEN: soft, nontender, no rebound or guarding, bowel sounds noted throughout abdomen GU:no cva tenderness NEURO: Pt is awake/alert/appropriate, moves all extremitiesx4.  No facial droop.  EXTREMITIES: pulses normal/equal, full ROM, no calf tenderness or edema SKIN: warm, color normal PSYCH: no abnormalities of mood noted, alert and oriented to situation  ED Results / Procedures / Treatments   Labs (all labs ordered are listed, but only abnormal results are displayed) Labs Reviewed  BASIC METABOLIC PANEL - Abnormal; Notable for the following components:      Result Value   Glucose, Bld 106 (*)    All other components within normal limits  CBC WITH DIFFERENTIAL/PLATELET - Abnormal; Notable for the following components:   RBC 4.13 (*)    HCT 38.4 (*)    All other components within normal limits  TROPONIN I (HIGH SENSITIVITY)  TROPONIN I (HIGH SENSITIVITY)  TROPONIN I (HIGH SENSITIVITY)    EKG EKG Interpretation  Date/Time:  Monday September 22 2019 00:53:27 EDT Ventricular Rate:  101 PR Interval:  212 QRS Duration: 134 QT Interval:  374 QTC Calculation: 484 R Axis:   5 Text Interpretation: Sinus tachycardia with 1st degree A-V block Left bundle branch block Abnormal ECG changed from prior Confirmed by Zadie Rhine ([id number]) on 09/22/2019 12:56:48 AM   Radiology DG Chest 2 View  Result Date: 09/22/2019 CLINICAL DATA:  Chest pain. EXAM: CHEST - 2 VIEW COMPARISON:  Radiograph and CTA 09/08/2019 FINDINGS: The cardiomediastinal contours are unchanged. Aortic atherosclerosis. Coronary artery calcification versus stent. The lungs are clear. Pulmonary vasculature is normal.  No consolidation, pleural effusion, or pneumothorax. No acute osseous abnormalities are seen. Multiple overlying monitoring devices. Battery pack overlies the chest. IMPRESSION: No acute chest findings. Aortic Atherosclerosis (ICD10-I70.0). Electronically Signed   By: Narda Rutherford M.D.   On: 09/22/2019 02:17    Procedures Procedures  Medications Ordered in ED Medications - No data to display  ED Course  I have reviewed the triage vital signs and the nursing notes.  Pertinent labs & imaging results that were available during my care of the patient were reviewed by me and considered in my medical decision making (see chart for details).    MDM Rules/Calculators/A&P                       This patient presents to the ED for concern of chest pain, this involves an extensive number of treatment options, and is a complaint that carries with it a high risk of complications and morbidity.  The differential diagnosis includes acute coronary syndrome, pulmonary embolism, pneumonia, pericarditis, aortic dissection   Lab Tests:   I Ordered,  reviewed, and interpreted labs, which included troponin, electrolytes, complete blood count   Imaging Studies ordered:   I ordered imaging studies which included chest x-ray   I independently visualized and interpreted imaging which showed no acute findings  Additional history obtained:    Previous records obtained and reviewed   Consultations Obtained:   I consulted cardiology fellow and discussed lab and imaging findings  Reevaluation:  After the interventions stated above, I reevaluated the patient and found patient is improved  . 5:57 AM . Patient with known history of CAD presents with chest pain.  Episode lasted 15 to 20 minutes and resolved with nitroglycerin.  He has not had any pain recently.  He reports this concerned him because he had this with minimal effort.  His EKG is unchanged left bundle branch block.  His troponins are  unremarkable.  He does had a recent admission for syncope and had a Myoview that revealed no inducible ischemia.  I reviewed the case with the on-call cardiology fellow.  Patient could be admitted, but likely would just be monitored and had troponins drawn since he just had recent echo.  I gave the patient the option of admission versus discharge.  Patient would like to be discharged home, but will repeat a third troponin and if no change he will be discharged home . I have low suspicion for aortic dissection or PE at this time. He has not had any CP here 6:54 AM Repeat troponin negative. Patient feels comfortable for discharge home. He will f/u with cardiology Final Clinical Impression(s) / ED Diagnoses Final diagnoses:  Precordial pain    Rx / DC Orders ED Discharge Orders    None       Ripley Fraise, MD 09/22/19 [phone]

## 2019-09-22 NOTE — ED Notes (Signed)
Pt verbalized understanding of d/c instructions, follow up cae and s/s requiring return to ed. Pt had no further questions at this time and was transported via wheelchair. To exit.

## 2019-09-22 NOTE — Discharge Instructions (Addendum)

## 2019-09-22 NOTE — ED Triage Notes (Signed)
Pt arrived via [facility] c/o CP from home. Initially pt had 6/10 central cp without radiation. Pt experienced ShoB w/ pain. Negative N/V. Pt received 324 ASA and 0.4 Nitro enroute. Pain now 0/10. Pt also c/o lightheadedness today. Pt A+Ox4 at this time.

## 2019-09-27 NOTE — Progress Notes (Deleted)
Cardiology Office Note   Date:  09/27/2019   ID:  Glenn Collins, DOB 11-Apr-1946, MRN [id number]  PCP:  Patient, No Pcp Per  Cardiologist:  Dr. Gwenlyn Found No chief complaint on file.    History of Present Illness: Glenn Collins is a 74 y.o. male who presents for posthospitalization follow-up with known history of CAD status post RCA and LAD stenting in 2007, 3 interventions in 2017 of the RCA for in-stent restenosis; mild carotid artery disease, PAD with right ABI 0.68 and left of 2.08 (noncompressible) with what appeared to be occluded FSA's bilaterally.  Placed on Pletal without improvement.  Hypertension, and hyperlipidemia.  He was recently seen by Dr. Johnsie Cancel for syncopal episode while hospitalized for same.  He was found unresponsive in his bed while talking to his wife and suddenly became unconscious.  Apparently this was a second episode of syncope in 1 week.  He complained of an irregular heart rhythm and feeling dizzy the day prior to admission.  Review of telemetry revealed bradycardia with 2-1 heart block with prolonged PR interval.  Beta-blocker was discontinued.  Pletal was held with possible need for Plavix.  He was planned for post discharge stress Myoview and a 30-day event monitor.  He was taken off of metoprolol.  He is here today to evaluate symptoms.    Past Medical History:  Diagnosis Date  . CAD (coronary artery disease) 10/27/2018  . Coronary artery disease   . Hypertension   . PAD (peripheral artery disease) ([place]) 10/27/2018    Past Surgical History:  Procedure Laterality Date  . CARDIAC SURGERY       Current Outpatient Medications  Medication Sig Dispense Refill  . aspirin EC 81 MG tablet Take 81 mg by mouth daily.    Marland Kitchen atorvastatin (LIPITOR) 80 MG tablet Take 1 tablet (80 mg total) by mouth daily at 6 PM. 90 tablet 2  . diphenhydrAMINE (BENADRYL) 25 MG tablet Take 25 mg by mouth at bedtime as needed for sleep.    Marland Kitchen ibuprofen (ADVIL) 200 MG tablet Take 200 mg by  mouth daily as needed for moderate pain.    Marland Kitchen loratadine (CLARITIN) 10 MG tablet Take 10 mg by mouth daily.    . Multiple Vitamins-Minerals (CENTRUM SILVER 50+MEN) TABS Take 1 tablet by mouth daily.    . nitroGLYCERIN (NITROSTAT) 0.4 MG SL tablet Place 1 tablet (0.4 mg total) under the tongue every 5 (five) minutes x 3 doses as needed for chest pain. 25 tablet 1  . ramipril (ALTACE) 10 MG capsule Take 10 mg by mouth daily.    . sodium chloride (OCEAN) 0.65 % SOLN nasal spray Place 1 spray into both nostrils as needed for congestion.     No current facility-administered medications for this visit.    Allergies:   Patient has no known allergies.    Social History:  The patient  reports that he has quit smoking. He has never used smokeless tobacco. He reports current alcohol use. He reports that he does not use drugs.   Family History:  The patient's family history includes CVA in his father; Cancer in his mother.    ROS: All other systems are reviewed and negative. Unless otherwise mentioned in H&P    PHYSICAL EXAM: VS:  There were no vitals taken for this visit. , BMI There is no height or weight on file to calculate BMI. GEN: Well nourished, well developed, in no acute distress HEENT: normal Neck: no JVD, carotid bruits, or masses Cardiac: ***  RRR; no murmurs, rubs, or gallops,no edema  Respiratory:  Clear to auscultation bilaterally, normal work of breathing GI: soft, nontender, nondistended, + BS MS: no deformity or atrophy Skin: warm and dry, no rash Neuro:  Strength and sensation are intact Psych: euthymic mood, full affect   EKG:  EKG {ACTION; IS/IS [id number]:[id number]} ordered today. The ekg ordered today demonstrates ***   Recent Labs: 10/28/2018: B Natriuretic Peptide 298.0 04/29/2019: ALT 32 09/16/2019: Magnesium 2.1 09/22/2019: BUN 18; Creatinine, Ser 1.01; Hemoglobin 13.1; Platelets 251; Potassium 4.2; Sodium 135    Lipid Panel    Component Value Date/Time   CHOL 153  04/29/2019 0928   TRIG 47 04/29/2019 0928   HDL 72 04/29/2019 0928   CHOLHDL 2.1 04/29/2019 0928   LDLCALC 71 04/29/2019 0928      Wt Readings from Last 3 Encounters:  09/22/19 200 lb (90.7 kg)  09/10/19 199 lb 8.3 oz (90.5 kg)  04/22/19 204 lb (92.5 kg)      Other studies Reviewed: Echocardiogram 2019-09-16 1. Left ventricular ejection fraction, by estimation, is 55 to 60%. The  left ventricle has normal function. The left ventricle has no regional  wall motion abnormalities. Left ventricular diastolic parameters are  consistent with Grade I diastolic  dysfunction (impaired relaxation).  2. Right ventricular systolic function is normal. The right ventricular  size is normal.  3. The mitral valve is grossly normal. No evidence of mitral valve  regurgitation. No evidence of mitral stenosis.  4. The aortic valve is tricuspid. Aortic valve regurgitation is not  visualized. Mild aortic valve stenosis.  5. Aortic dilatation noted. There is mild dilatation of the ascending  aorta measuring 39 mm.   ASSESSMENT AND PLAN:  1.  ***   Current medicines are reviewed at length with the patient today.  I have spent *** dedicated to the care of this patient on the date of this encounter to include pre-visit review of records, assessment, management and diagnostic testing,with shared decision making.  Labs/ tests ordered today include: *** Bettey Mare. Liborio Nixon, ANP, AACC   09/27/2019 8:47 PM    [facility] [address] Office [phone] Fax [phone]  Notice: This dictation was prepared with Dragon dictation along with smaller phrase technology. Any transcriptional errors that result from this process are unintentional and may not be corrected upon review.

## 2019-09-28 ENCOUNTER — Telehealth: Payer: Self-pay | Admitting: Cardiovascular Disease

## 2019-09-28 ENCOUNTER — Emergency Department (HOSPITAL_COMMUNITY): Payer: Medicare PPO

## 2019-09-28 ENCOUNTER — Inpatient Hospital Stay (HOSPITAL_COMMUNITY)
Admission: AD | Admit: 2019-09-28 | Discharge: 2019-09-30 | DRG: 244 | Disposition: A | Discharge status: Home / Self Care | Payer: Medicare PPO | Attending: Internal Medicine | Admitting: Internal Medicine

## 2019-09-28 ENCOUNTER — Other Ambulatory Visit: Payer: Self-pay

## 2019-09-28 ENCOUNTER — Encounter (HOSPITAL_COMMUNITY): Payer: Self-pay | Admitting: Emergency Medicine

## 2019-09-28 DIAGNOSIS — Z791 Long term (current) use of non-steroidal anti-inflammatories (NSAID): Secondary | ICD-10-CM | POA: Diagnosis not present

## 2019-09-28 DIAGNOSIS — Z950 Presence of cardiac pacemaker: Secondary | ICD-10-CM

## 2019-09-28 DIAGNOSIS — Z79899 Other long term (current) drug therapy: Secondary | ICD-10-CM | POA: Diagnosis not present

## 2019-09-28 DIAGNOSIS — Z87891 Personal history of nicotine dependence: Secondary | ICD-10-CM | POA: Diagnosis not present

## 2019-09-28 DIAGNOSIS — Z20822 Contact with and (suspected) exposure to covid-19: Secondary | ICD-10-CM | POA: Diagnosis present

## 2019-09-28 DIAGNOSIS — I10 Essential (primary) hypertension: Secondary | ICD-10-CM | POA: Diagnosis present

## 2019-09-28 DIAGNOSIS — R000 Tachycardia, unspecified: Secondary | ICD-10-CM | POA: Diagnosis present

## 2019-09-28 DIAGNOSIS — Z809 Family history of malignant neoplasm, unspecified: Secondary | ICD-10-CM

## 2019-09-28 DIAGNOSIS — I77810 Thoracic aortic ectasia: Secondary | ICD-10-CM | POA: Diagnosis present

## 2019-09-28 DIAGNOSIS — I739 Peripheral vascular disease, unspecified: Secondary | ICD-10-CM | POA: Diagnosis present

## 2019-09-28 DIAGNOSIS — Z823 Family history of stroke: Secondary | ICD-10-CM

## 2019-09-28 DIAGNOSIS — I442 Atrioventricular block, complete: Secondary | ICD-10-CM | POA: Diagnosis not present

## 2019-09-28 DIAGNOSIS — Z7982 Long term (current) use of aspirin: Secondary | ICD-10-CM | POA: Diagnosis not present

## 2019-09-28 DIAGNOSIS — I2510 Atherosclerotic heart disease of native coronary artery without angina pectoris: Secondary | ICD-10-CM | POA: Diagnosis not present

## 2019-09-28 DIAGNOSIS — R55 Syncope and collapse: Secondary | ICD-10-CM | POA: Diagnosis present

## 2019-09-28 LAB — BASIC METABOLIC PANEL
Anion gap: 11 (ref 5–15)
BUN: 14 mg/dL (ref 8–23)
CO2: 23 mmol/L (ref 22–32)
Calcium: 9.5 mg/dL (ref 8.9–10.3)
Chloride: 103 mmol/L (ref 98–111)
Creatinine, Ser: 1.03 mg/dL (ref 0.61–1.24)
GFR calc Af Amer: >60 mL/min (ref >60)
GFR calc non Af Amer: >60 mL/min (ref >60)
Glucose, Bld: 120 mg/dL — ABNORMAL HIGH (ref 70–99)
Potassium: 4.1 mmol/L (ref 3.5–5.1)
Sodium: 137 mmol/L (ref 135–145)

## 2019-09-28 LAB — TROPONIN I (HIGH SENSITIVITY): Troponin I (High Sensitivity): 9 ng/L (ref <18)

## 2019-09-28 LAB — CBC
HCT: 39.9 % (ref 39.0–52.0)
Hemoglobin: 13.2 g/dL (ref 13.0–17.0)
MCH: 31 pg (ref 26.0–34.0)
MCHC: 33.1 g/dL (ref 30.0–36.0)
MCV: 93.7 fL (ref 80.0–100.0)
Platelets: 234 10*3/uL (ref 150–400)
RBC: 4.26 MIL/uL (ref 4.22–5.81)
RDW: 13.3 % (ref 11.5–15.5)
WBC: 8.8 10*3/uL (ref 4.0–10.5)
nRBC: 0 % (ref 0.0–0.2)

## 2019-09-28 MED ORDER — SODIUM CHLORIDE 0.9% FLUSH: 3.0000 mL | Freq: Once | INTRAVENOUS | Status: DC

## 2019-09-28 MED ORDER — ONDANSETRON HCL 4 MG/2ML IJ SOLN: 4.0000 mg | Freq: Four times a day (QID) | INTRAMUSCULAR | Status: DC | PRN

## 2019-09-28 MED ORDER — ACETAMINOPHEN 325 MG PO TABS: 650.0000 mg | ORAL_TABLET | ORAL | Status: DC | PRN

## 2019-09-28 NOTE — ED Provider Notes (Signed)
[facility] EMERGENCY DEPARTMENT Provider Note  CSN: [id number] Arrival date & time: 09/28/19 2135  Chief Complaint(s) No chief complaint on file.  HPI Glenn Collins is a 74 y.o. male recently admitted for syncope and noted to have episodes of bradycardia, taken off beta-blocker.  Presents for near syncope.  The history is provided by the patient.  Near Syncope This is a recurrent problem. The current episode started 3 to 5 hours ago. The problem occurs rarely. The problem has been resolved. Associated symptoms include shortness of breath. Pertinent negatives include no chest pain, no abdominal pain and no headaches. Nothing aggravates the symptoms. Nothing relieves the symptoms. He has tried nothing for the symptoms.   The episode lasted approx 15 min. Patient noted bradycardia, lightheadedness, and feeling like he was going to "pass out."   Patient had a heart monitor placed.  During the episode noted that patient had nonsustained heart block, seen on monitor. Called by Dr. Charissa Bash and asked to present to the ED for admission.   Past Medical History Past Medical History:  Diagnosis Date  . CAD (coronary artery disease) 10/27/2018  . Coronary artery disease   . Hypertension   . PAD (peripheral artery disease) ([place]) 10/27/2018   Patient Active Problem List   Diagnosis Date Noted  . Syncope 09/08/2019  . Hyperlipidemia 04/22/2019  . Bilateral carotid bruits 12/24/2018  . PAD (peripheral artery disease) ([place]) 10/27/2018  . Hypertension 10/27/2018  . CAD (coronary artery disease) 10/27/2018  . NSTEMI (non-ST elevated myocardial infarction) 10/27/2018  . Acute back pain 10/27/2018   Home Medication(s) Prior to Admission medications   Medication Sig Start Date End Date Taking? Authorizing Provider  aspirin EC 81 MG tablet Take 81 mg by mouth daily.    [provider]  atorvastatin (LIPITOR) 80 MG tablet Take 1 tablet (80 mg total) by mouth daily at 6  PM. 04/03/19   Cheryln Manly, NP  diphenhydrAMINE (BENADRYL) 25 MG tablet Take 25 mg by mouth at bedtime as needed for sleep.    [provider]  ibuprofen (ADVIL) 200 MG tablet Take 200 mg by mouth daily as needed for moderate pain.    [provider]  loratadine (CLARITIN) 10 MG tablet Take 10 mg by mouth daily.    [provider]  Multiple Vitamins-Minerals (CENTRUM SILVER 50+MEN) TABS Take 1 tablet by mouth daily.    [provider]  nitroGLYCERIN (NITROSTAT) 0.4 MG SL tablet Place 1 tablet (0.4 mg total) under the tongue every 5 (five) minutes x 3 doses as needed for chest pain. 10/29/18   Cheryln Manly, NP  ramipril (ALTACE) 10 MG capsule Take 10 mg by mouth daily.    [provider]  sodium chloride (OCEAN) 0.65 % SOLN nasal spray Place 1 spray into both nostrils as needed for congestion.    [provider]  Past Surgical History Past Surgical History:  Procedure Laterality Date  . CARDIAC SURGERY     Family History Family History  Problem Relation Age of Onset  . Cancer Mother   . CVA Father     Social History Social History   Tobacco Use  . Smoking status: Former Games developer  . Smokeless tobacco: Never Used  Vaping Use  . Vaping Use: Never used  Substance Use Topics  . Alcohol use: Yes  . Drug use: Never   Allergies Patient has no known allergies.  Review of Systems Review of Systems  Respiratory: Positive for shortness of breath.   Cardiovascular: Positive for near-syncope. Negative for chest pain.  Gastrointestinal: Negative for abdominal pain.  Neurological: Negative for headaches.   All other systems are reviewed and are negative for acute change except as noted in the HPI Physical Exam Vital Signs  I have reviewed the triage vital signs BP 140/83 (BP Location: Right Arm)    Pulse (!) 102   Temp 98.3 F (36.8 C) (Oral)   Resp (!) 23   Ht 6' (1.829 m)   Wt 100 kg   SpO2 96%   BMI 29.90 kg/m   Physical Exam Vitals reviewed.  Constitutional:      General: He is not in acute distress.    Appearance: He is well-developed. He is not diaphoretic.  HENT:     Head: Normocephalic and atraumatic.     Jaw: No trismus.     Right Ear: External ear normal.     Left Ear: External ear normal.     Nose: Nose normal.  Eyes:     General: No scleral icterus.       Right eye: No discharge.        Left eye: No discharge.     Conjunctiva/sclera: Conjunctivae normal.     Pupils: Pupils are equal, round, and reactive to light.  Neck:     Trachea: Phonation normal.  Cardiovascular:     Rate and Rhythm: Regular rhythm. Tachycardia present.     Heart sounds: No murmur heard.  No friction rub. No gallop.   Pulmonary:     Effort: Pulmonary effort is normal. No respiratory distress.     Breath sounds: Normal breath sounds. No stridor. No rales.  Abdominal:     General: There is no distension.     Palpations: Abdomen is soft.     Tenderness: There is no abdominal tenderness.  Musculoskeletal:        General: No tenderness. Normal range of motion.     Cervical back: Normal range of motion and neck supple.  Skin:    General: Skin is warm and dry.     Findings: No erythema or rash.  Neurological:     Mental Status: He is alert and oriented to person, place, and time.  Psychiatric:        Behavior: Behavior normal.     ED Results and Treatments Labs (all labs ordered are listed, but only abnormal results are displayed) Labs Reviewed  BASIC METABOLIC PANEL - Abnormal; Notable for the following components:      Result Value   Glucose, Bld 120 (*)    All other components within normal limits  CBC  PROTIME-INR  TROPONIN I (HIGH SENSITIVITY)  EKG  EKG Interpretation  Date/Time:  Sunday September 28 2019 22:01:15 EDT Ventricular Rate:  105 PR Interval:  204 QRS Duration: 132 QT Interval:  360 QTC Calculation: 475 R Axis:   65 Text Interpretation: Sinus tachycardia Left bundle branch block Abnormal ECG No significant change since last tracing Confirmed by Drema Pry [id number]) on 09/28/2019 11:03:09 PM      Radiology DG Chest 2 View  Result Date: 09/28/2019 CLINICAL DATA:  Cardiac palpitations EXAM: CHEST - 2 VIEW COMPARISON:  09/22/2019 FINDINGS: Cardiac shadow is within normal limits. Loop recorder is noted. Lungs are well aerated bilaterally. No focal infiltrate or sizable effusion is seen. No bony abnormality is noted. IMPRESSION: No active cardiopulmonary disease. Electronically Signed   By: Alcide Clever M.D.   On: 09/28/2019 23:03    Pertinent labs & imaging results that were available during my care of the patient were reviewed by me and considered in my medical decision making (see chart for details).  Medications Ordered in ED Medications  sodium chloride flush (NS) 0.9 % injection 3 mL (has no administration in time range)                                                                                                                                    Procedures .1-3 Lead EKG Interpretation Performed by: Nira Conn, MD Authorized by: Nira Conn, MD     Interpretation: normal     ECG rate:  105   ECG rate assessment: normal     Rhythm: sinus tachycardia     Ectopy: none     Conduction: normal      (including critical care time)  Medical Decision Making / ED Course I have reviewed the nursing notes for this encounter and the patient's prior records (if available in EHR or on provided paperwork).   Apolo Lassen was evaluated in Emergency Department on 09/28/2019 for the symptoms described in the history of present illness. He was evaluated in the context of the global COVID-19  pandemic, which necessitated consideration that the patient might be at risk for infection with the SARS-CoV-2 virus that causes COVID-19. Institutional protocols and algorithms that pertain to the evaluation of patients at risk for COVID-19 are in a state of rapid change based on information released by regulatory bodies including the CDC and federal and state organizations. These policies and algorithms were followed during the patient's care in the ED.  Near syncope, noted complete HB on heart monitor.  EKG here with NSR, w/o acute changes. Labs w/o significant electrolyte derangements. Currently asymptomatic.  Will admit to cardiology.       Final Clinical Impression(s) / ED Diagnoses Final diagnoses:  Near syncope      This chart was dictated using voice recognition software.  Despite best efforts to proofread,  errors can occur which can change the documentation meaning.   Nira Conn, MD 09/29/19 [phone]

## 2019-09-28 NOTE — H&P (Signed)
Cardiology History & Physical    Patient ID: Glenn Collins MRN: [id number]; DOB: 05/17/1945   Admission date: 09/28/2019  Primary Care Provider: Patient, No Pcp Per Primary Cardiologist: Nanetta Batty, MD  Primary Electrophysiologist:  None   Chief Complaint:  dizziness  Patient Profile:   Glenn Collins is a 74 y.o. male with a history of CAD, HTN, and PAD who presents with lightheadedness.  History of Present Illness:   Glenn Collins has been wearing an event monitor due to two episodes of syncope over the past 3-4 weeks.  Today, he went for a walk and for a period during the walk felt like he was going to pass out.  During this, [employer] observed a 47 second period of complete heart block with a HR in the 30s.  I called the patient and instructed him to go to the ED for admission.  ED vitals are 140/83 HR 100, temp 98.3.  CBC and BMP are unremarkable.  ECG sinus rhythm LBBB PR 200 ms.  About 3 weeks ago he had an episode of syncope while lying on his bed that lasted about a minute.  His wife was with him and said that he made some strange noises.  He otherwise has felt well and denies chest pain or dyspnea.  Heart Pathway Score:      Past Medical History:  Diagnosis Date  . CAD (coronary artery disease) 10/27/2018  . Coronary artery disease   . Hypertension   . PAD (peripheral artery disease) (HCC) 10/27/2018    Past Surgical History:  Procedure Laterality Date  . CARDIAC SURGERY       Medications Prior to Admission: Prior to Admission medications   Medication Sig Start Date End Date Taking? Authorizing Provider  aspirin EC 81 MG tablet Take 81 mg by mouth daily.    [provider]  atorvastatin (LIPITOR) 80 MG tablet Take 1 tablet (80 mg total) by mouth daily at 6 PM. 04/03/19   Arty Baumgartner, NP  diphenhydrAMINE (BENADRYL) 25 MG tablet Take 25 mg by mouth at bedtime as needed for sleep.    [provider]  ibuprofen (ADVIL) 200 MG tablet  Take 200 mg by mouth daily as needed for moderate pain.    [provider]  loratadine (CLARITIN) 10 MG tablet Take 10 mg by mouth daily.    [provider]  Multiple Vitamins-Minerals (CENTRUM SILVER 50+MEN) TABS Take 1 tablet by mouth daily.    [provider]  nitroGLYCERIN (NITROSTAT) 0.4 MG SL tablet Place 1 tablet (0.4 mg total) under the tongue every 5 (five) minutes x 3 doses as needed for chest pain. 10/29/18   Arty Baumgartner, NP  ramipril (ALTACE) 10 MG capsule Take 10 mg by mouth daily.    [provider]  sodium chloride (OCEAN) 0.65 % SOLN nasal spray Place 1 spray into both nostrils as needed for congestion.    [provider]     Allergies:   No Known Allergies  Social History:   Social History   Socioeconomic History  . Marital status: Married    Spouse name: Not on file  . Number of children: Not on file  . Years of education: Not on file  . Highest education level: Not on file  Occupational History  . Not on file  Tobacco Use  . Smoking status: Former Games developer  . Smokeless tobacco: Never Used  Vaping Use  . Vaping Use: Never used  Substance and Sexual  Activity  . Alcohol use: Yes  . Drug use: Never  . Sexual activity: Not on file  Other Topics Concern  . Not on file  Social History Narrative  . Not on file   Social Determinants of Health   Financial Resource Strain:   . Difficulty of Paying Living Expenses:   Food Insecurity:   . Worried About Charity fundraiser in the Last Year:   . Arboriculturist in the Last Year:   Transportation Needs:   . Film/video editor (Medical):   Marland Kitchen Lack of Transportation (Non-Medical):   Physical Activity:   . Days of Exercise per Week:   . Minutes of Exercise per Session:   Stress:   . Feeling of Stress :   Social Connections:   . Frequency of Communication with Friends and Family:   . Frequency of Social Gatherings with Friends and Family:   . Attends Religious  Services:   . Active Member of Clubs or Organizations:   . Attends Archivist Meetings:   Marland Kitchen Marital Status:   Intimate Partner Violence:   . Fear of Current or Ex-Partner:   . Emotionally Abused:   Marland Kitchen Physically Abused:   . Sexually Abused:      Family History:   The patient's family history includes CVA in his father; Cancer in his mother.    ROS:  Please see the history of present illness.  All other ROS reviewed and negative.     Physical Exam/Data:   Vitals:   09/28/19 2138 09/28/19 2212 09/28/19 2337  BP: 138/67  140/83  Pulse: (!) 108  (!) 102  Resp: 18  (!) 23  Temp: 98.3 F (36.8 C)    TempSrc: Oral    SpO2: 98%  97%  Weight:  100 kg   Height:  6' (1.829 m)    No intake or output data in the 24 hours ending 09/28/19 2347 Last 3 Weights 09/28/2019 09/22/2019 09/10/2019  Weight (lbs) 220 lb 7.4 oz 200 lb 199 lb 8.3 oz  Weight (kg) 100 kg 90.719 kg 90.5 kg     Body mass index is 29.9 kg/m.  Wt Readings from Last 3 Encounters:  09/28/19 100 kg  09/22/19 90.7 kg  09/10/19 90.5 kg    Physical Exam: General: Well developed, well nourished, in no acute distress. Head: Normocephalic, atraumatic, sclera non-icteric, no xanthomas, nares are without discharge.  Neck: Negative for carotid bruits. JVD not elevated. Lungs: Clear bilaterally to auscultation without wheezes, rales, or rhonchi. Breathing is unlabored. Heart: RRR with S1 S2. No murmurs, rubs, or gallops appreciated. Abdomen: Soft, non-tender, non-distended with normoactive bowel sounds. No hepatomegaly. No rebound/guarding. No obvious abdominal masses. Msk:  Strength and tone appear normal for age. Extremities: No clubbing or cyanosis. No edema.  Distal pedal pulses are 2+ and equal bilaterally. Neuro: Alert and oriented X 3. No focal deficit. No facial asymmetry. Moves all extremities spontaneously. Psych:  Responds to questions appropriately with a normal affect.    EKG:  See HPI  Relevant CV  Studies: Echo 09/08/19 1. Left ventricular ejection fraction, by estimation, is 55 to 60%. The  left ventricle has normal function. The left ventricle has no regional  wall motion abnormalities. Left ventricular diastolic parameters are  consistent with Grade I diastolic  dysfunction (impaired relaxation).  2. Right ventricular systolic function is normal. The right ventricular  size is normal.  3. The mitral valve is grossly normal. No evidence of mitral  valve  regurgitation. No evidence of mitral stenosis.  4. The aortic valve is tricuspid. Aortic valve regurgitation is not  visualized. Mild aortic valve stenosis.  5. Aortic dilatation noted. There is mild dilatation of the ascending  aorta measuring 39 mm.    Laboratory Data:  High Sensitivity Troponin:   Recent Labs  Lab 09/08/19 0541 09/22/19 0215 09/22/19 0314 09/22/19 0548 09/28/19 2219  TROPONINIHS 42* 11 11 11 9       Cardiac EnzymesNo results for input(s): TROPONINI in the last 168 hours. No results for input(s): TROPIPOC in the last 168 hours.  Chemistry Recent Labs  Lab 09/22/19 0215 09/28/19 2219  NA 135 137  K 4.2 4.1  CL 99 103  CO2 25 23  GLUCOSE 106* 120*  BUN 18 14  CREATININE 1.01 1.03  CALCIUM 9.3 9.5  GFRNONAA >60 >60  GFRAA >60 >60  ANIONGAP 11 11    No results for input(s): PROT, ALBUMIN, AST, ALT, ALKPHOS, BILITOT in the last 168 hours. Hematology Recent Labs  Lab 09/22/19 0215 09/28/19 2219  WBC 7.4 8.8  RBC 4.13* 4.26  HGB 13.1 13.2  HCT 38.4* 39.9  MCV 93.0 93.7  MCH 31.7 31.0  MCHC 34.1 33.1  RDW 13.1 13.3  PLT 251 234   BNPNo results for input(s): BNP, PROBNP in the last 168 hours.  DDimer No results for input(s): DDIMER in the last 168 hours.   Radiology/Studies:  DG Chest 2 View  Result Date: 09/28/2019 CLINICAL DATA:  Cardiac palpitations EXAM: CHEST - 2 VIEW COMPARISON:  09/22/2019 FINDINGS: Cardiac shadow is within normal limits. Loop recorder is noted. Lungs  are well aerated bilaterally. No focal infiltrate or sizable effusion is seen. No bony abnormality is noted. IMPRESSION: No active cardiopulmonary disease. Electronically Signed   By: 11/22/2019 M.D.   On: 09/28/2019 23:03    Assessment and Plan   1. Paroxysmal complete heart block 74 y.o. male with two episodes of syncope in the past month, who presents after an episode of presyncope likely due to a 77 second episode of complete heart block observed on his event monitor.  Currently he is stable in normal sinus rhythm.  He will require permanent pacemaker implantation for paroxysmal CHB.  Recent echo with normal LV EF. -- NPO -- EP to evaluate in the morning  2. Hypertension Continue home Enalapril  3. CAD Continue home aspirin and atorvastatin    Severity of Illness: The appropriate patient status for this patient is INPATIENT. Inpatient status is judged to be reasonable and necessary in order to provide the required intensity of service to ensure the patient's safety. The patient's presenting symptoms, physical exam findings, and initial radiographic and laboratory data in the context of their chronic comorbidities is felt to place them at high risk for further clinical deterioration. Furthermore, it is not anticipated that the patient will be medically stable for discharge from the hospital within 2 midnights of admission. The following factors support the patient status of inpatient.   " The patient's presenting symptoms include lightheadedness. " The worrisome physical exam findings include none. " The initial radiographic and laboratory data are worrisome because of complete heart block on event monitor. " The chronic co-morbidities include CAD, HTN.   * I certify that at the point of admission it is my clinical judgment that the patient will require inpatient hospital care spanning beyond 2 midnights from the point of admission due to high intensity of service, high risk for  further  deterioration and high frequency of surveillance required.*    For questions or updates, please contact [facility] Please consult [email or web address] for contact info under      Signed, Seena Face S, MD 09/28/2019, 11:47 PM

## 2019-09-28 NOTE — ED Triage Notes (Signed)
Patient reports feeling lightheaded/dizzy " almost passed out" with irregular heart beat today , denies chest pain or SOB , no emesis or diaphoresis.

## 2019-09-28 NOTE — Telephone Encounter (Addendum)
Cardiology Telephone Note  Was called by [employer] for critical ECG.  Patient had an episode of complete heart block with HR in the 30s lasting 47 seconds.  I spoke to the patient, and he was out for a walk at that time, and felt like he was going to pass out.  He has passed out twice in the past 1-2 weeks.  I recommended the patient come to the ED for admission to the Cardiology service, for likely permanent pacemaker implantation tomorrow or Tuesday.  He will have someone else drive him.

## 2019-09-29 ENCOUNTER — Encounter (HOSPITAL_COMMUNITY): Payer: Self-pay | Admitting: Internal Medicine

## 2019-09-29 ENCOUNTER — Inpatient Hospital Stay (HOSPITAL_COMMUNITY)
Admission: AD | Disposition: A | Discharge status: Home / Self Care | Payer: Self-pay | Source: Home / Self Care | Attending: Internal Medicine

## 2019-09-29 ENCOUNTER — Ambulatory Visit: Payer: Medicare PPO | Admitting: Adult Health

## 2019-09-29 DIAGNOSIS — I442 Atrioventricular block, complete: Principal | ICD-10-CM

## 2019-09-29 HISTORY — PX: PACEMAKER IMPLANT: EP1218

## 2019-09-29 LAB — SURGICAL PCR SCREEN
MRSA, PCR: NEGATIVE
Staphylococcus aureus: NEGATIVE

## 2019-09-29 LAB — PROTIME-INR
INR: 1.1 (ref 0.8–1.2)
Prothrombin Time: 13.8 seconds (ref 11.4–15.2)

## 2019-09-29 LAB — SARS CORONAVIRUS 2 BY RT PCR (HOSPITAL ORDER, PERFORMED IN ~~LOC~~ HOSPITAL LAB): SARS Coronavirus 2: NEGATIVE

## 2019-09-29 LAB — TSH: TSH: 1.493 u[IU]/mL (ref 0.350–4.500)

## 2019-09-29 LAB — TROPONIN I (HIGH SENSITIVITY): Troponin I (High Sensitivity): 10 ng/L (ref <18)

## 2019-09-29 SURGERY — PACEMAKER IMPLANT

## 2019-09-29 MED ORDER — HEPARIN (PORCINE) IN NACL 1000-0.9 UT/500ML-% IV SOLN
INTRAVENOUS | Status: DC | PRN
  Administered 2019-09-29: 500 mL

## 2019-09-29 MED ORDER — FENTANYL CITRATE (PF) 100 MCG/2ML IJ SOLN
INTRAMUSCULAR | Status: AC
  Filled 2019-09-29: qty 2

## 2019-09-29 MED ORDER — CEFAZOLIN SODIUM-DEXTROSE 2-4 GM/100ML-% IV SOLN
INTRAVENOUS | Status: AC
  Filled 2019-09-29: qty 100

## 2019-09-29 MED ORDER — SODIUM CHLORIDE 0.9 % IV SOLN: INTRAVENOUS | Status: DC

## 2019-09-29 MED ORDER — SODIUM CHLORIDE 0.9 % IV SOLN: 250.0000 mL | INTRAVENOUS | Status: DC

## 2019-09-29 MED ORDER — RAMIPRIL 10 MG PO CAPS
10.0000 mg | ORAL_CAPSULE | Freq: Every day | ORAL | Status: DC
  Administered 2019-09-30: 10 mg via ORAL
  Filled 2019-09-29 (×2): qty 1

## 2019-09-29 MED ORDER — ONDANSETRON HCL 4 MG/2ML IJ SOLN
4.0000 mg | Freq: Four times a day (QID) | INTRAMUSCULAR | Status: DC | PRN
Start: 2019-09-29 — End: 2019-09-29

## 2019-09-29 MED ORDER — ATORVASTATIN CALCIUM 80 MG PO TABS
80.0000 mg | ORAL_TABLET | Freq: Every day | ORAL | Status: DC
  Administered 2019-09-29: 80 mg via ORAL
  Filled 2019-09-29: qty 1

## 2019-09-29 MED ORDER — CEFAZOLIN SODIUM-DEXTROSE 1-4 GM/50ML-% IV SOLN: 1.0000 g | Freq: Four times a day (QID) | INTRAVENOUS | Status: DC

## 2019-09-29 MED ORDER — ACETAMINOPHEN 325 MG PO TABS
325.0000 mg | ORAL_TABLET | ORAL | Status: DC | PRN
  Administered 2019-09-29: 650 mg via ORAL
  Filled 2019-09-29: qty 2

## 2019-09-29 MED ORDER — CEFAZOLIN SODIUM-DEXTROSE 2-4 GM/100ML-% IV SOLN
2.0000 g | INTRAVENOUS | Status: AC
  Administered 2019-09-29: 2 g via INTRAVENOUS

## 2019-09-29 MED ORDER — MIDAZOLAM HCL 5 MG/5ML IJ SOLN
INTRAMUSCULAR | Status: AC
  Filled 2019-09-29: qty 5

## 2019-09-29 MED ORDER — IOHEXOL 350 MG/ML SOLN
INTRAVENOUS | Status: DC | PRN
  Administered 2019-09-29: 10 mL via INTRAVENOUS

## 2019-09-29 MED ORDER — SODIUM CHLORIDE 0.9% FLUSH
3.0000 mL | Freq: Two times a day (BID) | INTRAVENOUS | Status: DC
  Administered 2019-09-29 – 2019-09-30 (×2): 3 mL via INTRAVENOUS

## 2019-09-29 MED ORDER — LIDOCAINE HCL (PF) 1 % IJ SOLN
INTRAMUSCULAR | Status: DC | PRN
  Administered 2019-09-29: 60 mL

## 2019-09-29 MED ORDER — CHLORHEXIDINE GLUCONATE 4 % EX LIQD
60.0000 mL | Freq: Once | CUTANEOUS | Status: AC
  Administered 2019-09-29: 4 via TOPICAL

## 2019-09-29 MED ORDER — MIDAZOLAM HCL 5 MG/5ML IJ SOLN
INTRAMUSCULAR | Status: DC | PRN
  Administered 2019-09-29: 2 mg via INTRAVENOUS

## 2019-09-29 MED ORDER — FENTANYL CITRATE (PF) 100 MCG/2ML IJ SOLN
INTRAMUSCULAR | Status: DC | PRN
  Administered 2019-09-29: 25 ug via INTRAVENOUS

## 2019-09-29 MED ORDER — SODIUM CHLORIDE 0.9% FLUSH: 3.0000 mL | INTRAVENOUS | Status: DC | PRN

## 2019-09-29 MED ORDER — ONDANSETRON HCL 4 MG/2ML IJ SOLN: 4.0000 mg | Freq: Four times a day (QID) | INTRAMUSCULAR | Status: DC | PRN

## 2019-09-29 MED ORDER — CHLORHEXIDINE GLUCONATE 4 % EX LIQD
60.0000 mL | Freq: Once | CUTANEOUS | Status: AC
  Administered 2019-09-29: 4 via TOPICAL
  Filled 2019-09-29: qty 60

## 2019-09-29 MED ORDER — HEPARIN (PORCINE) IN NACL 1000-0.9 UT/500ML-% IV SOLN
INTRAVENOUS | Status: AC
  Filled 2019-09-29: qty 500

## 2019-09-29 MED ORDER — SODIUM CHLORIDE 0.9 % IV SOLN
80.0000 mg | INTRAVENOUS | Status: AC
  Administered 2019-09-29: 80 mg

## 2019-09-29 MED ORDER — SODIUM CHLORIDE 0.9 % IV SOLN
INTRAVENOUS | Status: AC
  Filled 2019-09-29: qty 2

## 2019-09-29 MED ORDER — CEFAZOLIN SODIUM-DEXTROSE 1-4 GM/50ML-% IV SOLN
1.0000 g | Freq: Four times a day (QID) | INTRAVENOUS | Status: AC
  Administered 2019-09-29 – 2019-09-30 (×3): 1 g via INTRAVENOUS
  Filled 2019-09-29 (×3): qty 50

## 2019-09-29 MED ORDER — ASPIRIN EC 81 MG PO TBEC
81.0000 mg | DELAYED_RELEASE_TABLET | Freq: Every day | ORAL | Status: DC
  Administered 2019-09-29 – 2019-09-30 (×2): 81 mg via ORAL
  Filled 2019-09-29 (×2): qty 1

## 2019-09-29 MED ORDER — ACETAMINOPHEN 325 MG PO TABS: 325.0000 mg | ORAL_TABLET | ORAL | Status: DC | PRN

## 2019-09-29 MED ORDER — LIDOCAINE HCL 1 % IJ SOLN
INTRAMUSCULAR | Status: AC
  Filled 2019-09-29: qty 60

## 2019-09-29 SURGICAL SUPPLY — 8 items
CABLE SURGICAL S-101-97-12 (CABLE) ×2 IMPLANT
GUIDEWIRE ANGLED .035X150CM (WIRE) ×2 IMPLANT
LEAD SOLIA S PRO MRI 53 (Lead) ×2 IMPLANT
LEAD SOLIA S PRO MRI 60 (Lead) ×2 IMPLANT
PACEMAKER EDORA 8DR-T MRI (Pacemaker) ×2 IMPLANT
PAD PRO RADIOLUCENT 2001M-C (PAD) ×2 IMPLANT
SHEATH 7FR PRELUDE SNAP 13 (SHEATH) ×4 IMPLANT
TRAY PACEMAKER INSERTION (PACKS) ×2 IMPLANT

## 2019-09-29 NOTE — Consult Note (Addendum)
Cardiology Consultation:   Patient ID: Glenn Collins MRN: [id number]; DOB: February 01, 1946  Admit date: 09/28/2019 Date of Consult: 09/29/2019  Primary Care Provider: Patient, No Pcp Per [facility] Cardiologist: Glenn Batty, MD  [facility] Electrophysiologist:  None   Patient Profile:   Glenn Collins is a 74 y.o. male with a hx of CAD (RCA and LAD stenting in 2007 with 3 intervention in 2017 of the RCA for in-stent restenosis), HTN, HLD, PAD (right ABI 0.68 and left of 2.08 (noncompressible) with what appears to be occluded SFAs bilaterally) who is being seen today for the evaluation of CHB at the request of Glenn Collins.  History of Present Illness:   Glenn Collins was admitted to [facility] May 2/2 recurrent syncope, found to have new LBBB, was found to have 2:1 AVBlock on by EMS, seen by cardiology and his home Toprol held.  Echo was done with preserved LVEF, stress myoview was negative for ischemic.  He had an elevated ddimer with neg venous dopplers,CTA with no PE.  He was discharged 09/10/19 with no further heart block noted, off his home Pletal and Toprol with plans for outpatient monitoring and f/u with Glenn Collins   He had an ER visit 09/22/19 with CP, given recent negative stress test, cardiac w/u, neg HS Trop (x3) he was discharged from the ER after d/w on call cardiology, CP free at discharge.  Last night on call fellow received call from the monitoring company  Of 47 seconds of CHB with V rates 30's, in d/w the pt, he had been out walking and had dizziness that correlated with time of the heart block.  He was advised to come to the hospital and was admitted for EP evaluation and likely need for pacing.  He reports that yesterday evening he was walking with his wife, again became suddenly weak needing her support, he did not faint though did come close.  He reports that when the MD called him the timing of his near fainting was the time of his HR slowing on his monitor. NO CP   HS Trop  9 > 10 K+ 4.1 BUN/Creat 14/1.03 WBC 8.8 H/H 13/39 Plts 234  COVID negative  Home meds No potential nodal blocking agents The pt confirms he has not taken the metoprolol since his discharge last month   Past Medical History:  Diagnosis Date  . CAD (coronary artery disease) 10/27/2018  . Coronary artery disease   . Hypertension   . PAD (peripheral artery disease) (HCC) 10/27/2018    Past Surgical History:  Procedure Laterality Date  . CARDIAC SURGERY       Home Medications:  Prior to Admission medications   Medication Sig Start Date End Date Taking? Authorizing Provider  aspirin EC 81 MG tablet Take 81 mg by mouth daily.   Yes [provider]  atorvastatin (LIPITOR) 80 MG tablet Take 1 tablet (80 mg total) by mouth daily at 6 PM. 04/03/19  Yes Arty Baumgartner, NP  diphenhydrAMINE (BENADRYL) 25 MG tablet Take 25 mg by mouth at bedtime.    Yes [provider]  ibuprofen (ADVIL) 200 MG tablet Take 200 mg by mouth daily as needed for moderate pain.   Yes [provider]  loratadine (CLARITIN) 10 MG tablet Take 10 mg by mouth daily as needed for allergies.    Yes [provider]  Multiple Vitamin (MULTIVITAMIN WITH MINERALS) TABS tablet Take 1 tablet by mouth daily.   Yes [provider]  nitroGLYCERIN (NITROSTAT)  0.4 MG SL tablet Place 1 tablet (0.4 mg total) under the tongue every 5 (five) minutes x 3 doses as needed for chest pain. 10/29/18  Yes Laverda Page B, NP  ramipril (ALTACE) 10 MG capsule Take 10 mg by mouth daily.   Yes [provider]  sodium chloride (OCEAN) 0.65 % SOLN nasal spray Place 1 spray into both nostrils as needed for congestion.   Yes [provider]    Inpatient Medications: Scheduled Meds: . aspirin EC  81 mg Oral Daily  . atorvastatin  80 mg Oral q1800  . ramipril  10 mg Oral Daily  . sodium chloride flush  3 mL Intravenous Once   Continuous Infusions:  PRN Meds: acetaminophen,  ondansetron (ZOFRAN) IV  Allergies:   No Known Allergies  Social History:   Social History   Socioeconomic History  . Marital status: Married    Spouse name: Not on file  . Number of children: Not on file  . Years of education: Not on file  . Highest education level: Not on file  Occupational History  . Not on file  Tobacco Use  . Smoking status: Former Games developer  . Smokeless tobacco: Never Used  Vaping Use  . Vaping Use: Never used  Substance and Sexual Activity  . Alcohol use: Yes  . Drug use: Never  . Sexual activity: Not on file  Other Topics Concern  . Not on file  Social History Narrative  . Not on file   Social Determinants of Health   Financial Resource Strain:   . Difficulty of Paying Living Expenses:   Food Insecurity:   . Worried About Programme researcher, broadcasting/film/video in the Last Year:   . Barista in the Last Year:   Transportation Needs:   . Freight forwarder (Medical):   Marland Kitchen Lack of Transportation (Non-Medical):   Physical Activity:   . Days of Exercise per Week:   . Minutes of Exercise per Session:   Stress:   . Feeling of Stress :   Social Connections:   . Frequency of Communication with Friends and Family:   . Frequency of Social Gatherings with Friends and Family:   . Attends Religious Services:   . Active Member of Clubs or Organizations:   . Attends Banker Meetings:   Marland Kitchen Marital Status:   Intimate Partner Violence:   . Fear of Current or Ex-Partner:   . Emotionally Abused:   Marland Kitchen Physically Abused:   . Sexually Abused:     Family History:   Family History  Problem Relation Age of Onset  . Cancer Mother   . CVA Father      ROS:  Please see the history of present illness.  All other ROS reviewed and negative.     Physical Exam/Data:   Vitals:   09/28/19 2339 09/29/19 0132 09/29/19 0250 09/29/19 0725  BP:  135/76 (!) 148/79 (!) 146/87  Pulse:  93 79   Resp:  14    Temp:  97.9 F (36.6 C) 97.8 F (36.6 C) 97.7 F (36.5  C)  TempSrc:  Oral Oral Oral  SpO2: 96% 99% 96%   Weight:   89.6 kg   Height:   6' (1.829 m)    No intake or output data in the 24 hours ending 09/29/19 0756 Last 3 Weights 09/29/2019 09/28/2019 09/22/2019  Weight (lbs) 197 lb 9.6 oz 220 lb 7.4 oz 200 lb  Weight (kg) 89.631 kg 100 kg 90.719  kg     Body mass index is 26.8 kg/m.  General:  Well nourished, well developed, in no acute distress HEENT: normal Lymph: no adenopathy Neck: no JVD Endocrine:  No thryomegaly Vascular: No carotid bruits Cardiac:  RRR;2/6 SM, no gallops or rubs Lungs:  CTA b/l, no wheezing, rhonchi or rales  Abd: soft, nontender  Ext: no edema Musculoskeletal:  No deformities Skin: warm and dry  Neuro:  no focal abnormalities noted Psych:  Normal affect   EKG:  The EKG was personally reviewed and demonstrates:    ST 105bpm, LBBB  09/22/2019 ST 101bpm, LBBB, 1st degree AVblock 10/29/2018: SR/sinus arrhythmia, 65bpm, QRS 67ms  Telemetry:  Telemetry was personally reviewed and demonstrates:   SB/SR 50's-60's     Relevant CV Studies:  09/09/19: Lexiscan stress myoiew IMPRESSION: 1. No reversible ischemia or infarction. 2. Inferior wall hypokinesis. 3. Left ventricular ejection fraction 73% 4. Non invasive risk stratification*: Low   09/08/2019: TTE IMPRESSIONS  1. Left ventricular ejection fraction, by estimation, is 55 to 60%. The  left ventricle has normal function. The left ventricle has no regional  wall motion abnormalities. Left ventricular diastolic parameters are  consistent with Grade I diastolic  dysfunction (impaired relaxation).  2. Right ventricular systolic function is normal. The right ventricular  size is normal.  3. The mitral valve is grossly normal. No evidence of mitral valve  regurgitation. No evidence of mitral stenosis.  4. The aortic valve is tricuspid. Aortic valve regurgitation is not  visualized. Mild aortic valve stenosis.  5. Aortic dilatation noted. There  is mild dilatation of the ascending  aorta measuring 39 mm.   Laboratory Data:  High Sensitivity Troponin:   Recent Labs  Lab 09/22/19 0215 09/22/19 0314 09/22/19 0548 09/28/19 2219 09/29/19 0014  TROPONINIHS 11 11 11 9 10      Chemistry Recent Labs  Lab 09/28/19 2219  NA 137  K 4.1  CL 103  CO2 23  GLUCOSE 120*  BUN 14  CREATININE 1.03  CALCIUM 9.5  GFRNONAA >60  GFRAA >60  ANIONGAP 11    No results for input(s): PROT, ALBUMIN, AST, ALT, ALKPHOS, BILITOT in the last 168 hours. Hematology Recent Labs  Lab 09/28/19 2219  WBC 8.8  RBC 4.26  HGB 13.2  HCT 39.9  MCV 93.7  MCH 31.0  MCHC 33.1  RDW 13.3  PLT 234   BNPNo results for input(s): BNP, PROBNP in the last 168 hours.  DDimer No results for input(s): DDIMER in the last 168 hours.   Radiology/Studies:   DG Chest 2 View Result Date: 09/28/2019 CLINICAL DATA:  Cardiac palpitations EXAM: CHEST - 2 VIEW COMPARISON:  09/22/2019 FINDINGS: Cardiac shadow is within normal limits. Loop recorder is noted. Lungs are well aerated bilaterally. No focal infiltrate or sizable effusion is seen. No bony abnormality is noted. IMPRESSION: No active cardiopulmonary disease. Electronically Signed   By: 11/22/2019 M.D.   On: 09/28/2019 23:03     Assessment and Plan:   1. H/o recurrent syncope 2. CHB 3. New LBBB (noted last month)  Known CAD, though w/u in May with preserved LVEF and no ischemia on his stress test Home Toprol held in May 2/2 2:1 AVblock and LBBB  I do not see any reversible causes.  Will check a TSH for completeness though doubt contributor.  He will need PPM I have discussed the findings and rational for PPM with the patient, discussed the implant procedure, potential risks and benefits.  He  is agreeable to proceed  Dr. Lovena Le will see later this morning Perhaps plan for Left bundle pacing  4. CAD     He has not had any further CP     Negative ischemic w/u a few weeks ago     On ASA, statin      Can resume BB post pacing  5. SM on exam     Mild AS by his echo last month        For questions or updates, please contact [facility] Please consult [email or web address] for contact info under    Signed, Baldwin Jamaica, PA-C  09/29/2019 7:56 AM   EP Attending  Patient seen and examined. Agree with the findings as noted above. The patient presents with Elna Breslow syncope. He has recurrent syncope and is on no medical therapy. I have reviewed the indications/risks/benefits/goals/expectations of PPM insertion and he wishes to proceed.  Mikle Bosworth.D.

## 2019-09-29 NOTE — Plan of Care (Signed)
Received patient from ED at 0300; alert, oriented; wearing outpatient cardiac monitoring device over a week secondary to "passing out" episodes per his report. Denis chest pain or SOB; VS. HB on the the monitor. Plan to have Pacemaker implant today.  Problem: Education: Goal: Knowledge of cardiac device and self-care will improve Outcome: Progressing Goal: Ability to safely manage health related needs after discharge will improve Outcome: Progressing   Problem: Cardiac: Goal: Ability to achieve and maintain adequate cardiopulmonary perfusion will improve Outcome: Progressing   Problem: Education: Goal: Knowledge of General Education information will improve Description: Including pain rating scale, medication(s)/side effects and non-pharmacologic comfort measures Outcome: Progressing   Problem: Health Behavior/Discharge Planning: Goal: Ability to manage health-related needs will improve Outcome: Progressing   Problem: Clinical Measurements: Goal: Cardiovascular complication will be avoided Outcome: Progressing   Problem: Activity: Goal: Risk for activity intolerance will decrease Outcome: Progressing

## 2019-09-29 NOTE — Plan of Care (Signed)
  Problem: Education: Goal: Ability to safely manage health related needs after discharge will improve Outcome: Progressing   Problem: Pain Managment: Goal: General experience of comfort will improve Outcome: Progressing   Problem: Cardiac: Goal: Ability to achieve and maintain adequate cardiopulmonary perfusion will improve Outcome: Completed/Met   Problem: Clinical Measurements: Goal: Ability to maintain clinical measurements within normal limits will improve Outcome: Completed/Met   Problem: Nutrition: Goal: Adequate nutrition will be maintained Outcome: Completed/Met   Problem: Coping: Goal: Level of anxiety will decrease Outcome: Completed/Met   Problem: Elimination: Goal: Will not experience complications related to urinary retention Outcome: Completed/Met

## 2019-09-29 NOTE — Discharge Instructions (Signed)
    Supplemental Discharge Instructions for  Pacemaker/Defibrillator Patients  Activity No heavy lifting or vigorous activity with your left/right arm for 6 to 8 weeks.  Do not raise your left/right arm above your head for one week.  Gradually raise your affected arm as drawn below.              10/03/2019               10/04/2019                 10/05/2019                   10/06/2019             __  NO DRIVING until cleared to at your wound check visit .  WOUND CARE - Keep the wound area clean and dry.  Do not get this area wet for one week. No showers for one week; you may shower on  10/06/2019 . - The tape/steri-strips on your wound will fall off; do not pull them off.  No bandage is needed on the site.  DO  NOT apply any creams, oils, or ointments to the wound area. - If you notice any drainage or discharge from the wound, any swelling or bruising at the site, or you develop a fever > 101? F after you are discharged home, call the office at once.  Special Instructions - You are still able to use cellular telephones; use the ear opposite the side where you have your pacemaker/defibrillator.  Avoid carrying your cellular phone near your device. - When traveling through airports, show security personnel your identification card to avoid being screened in the metal detectors.  Ask the security personnel to use the hand wand. - Avoid arc welding equipment, MRI testing (magnetic resonance imaging), TENS units (transcutaneous nerve stimulators).  Call the office for questions about other devices. - Avoid electrical appliances that are in poor condition or are not properly grounded. - Microwave ovens are safe to be near or to operate.

## 2019-09-30 ENCOUNTER — Inpatient Hospital Stay (HOSPITAL_COMMUNITY): Payer: Medicare PPO

## 2019-09-30 MED FILL — Lidocaine HCl Local Inj 1%: INTRAMUSCULAR | Qty: 60 | Status: AC

## 2019-09-30 NOTE — Discharge Summary (Addendum)
ELECTROPHYSIOLOGY PROCEDURE DISCHARGE SUMMARY    Patient ID: Glenn Collins,  MRN: [id number], DOB/AGE: Apr 10, 1946 74 y.o.  Admit date: 09/28/2019 Discharge date: 09/30/2019  Primary Care Physician: Patient, No Pcp Per  Primary Cardiologist: Dr. Allyson Sabal Electrophysiologist: new, Dr. Ladona Ridgel  Primary Discharge Diagnosis:  1. CHB 2. Syncope, near syncope  Secondary Discharge Diagnosis:  1. CAD 2. PVD 3. HTN  No Known Allergies   Procedures This Admission:  1.  Implantation of a Biotronik dual chamber PPM on 09/29/2019 by Dr Ladona Ridgel.   See full report for details 2.  CXR on 09/30/2019 demonstrated no pneumothorax status post device implantation.   Brief HPI: Glenn Collins is a 74 y.o. male was noted by the holter monitoring company of 47 seconds of CHB with V rates 30's, on call cardiology fellow was alerted, in d/w the pt, he had been out walking and had dizziness that correlated with time of the heart block.  He was advised to come to the hospital and was admitted for EP evaluation and likely need for pacing.  Hospital Course:  The patient was previously admitted to [facility] 2/2 recurrent syncope, found to have new LBBB, was found to have 2:1 AVBlock on by EMS, seen by cardiology and his home Toprol held.  Echo was done with preserved LVEF, stress myoview was negative for ischemic.  He had an elevated ddimer with neg venous dopplers,CTA with no PE.  He was discharged 09/10/19 with no further heart block noted, off his home Pletal and Toprol with plans for outpatient monitoring and f/u with Dr. Allyson Sabal   He had an ER visit 09/22/19 with CP, given recent negative stress test, cardiac w/u, neg HS Trop (x3) he was discharged from the ER after d/w on call cardiology, CP free at discharge.  He reported this admissioni that the evening of his noted CHB he was walking with his wife, again became suddenly weak needing her support, he did not faint though did come close.  He reports that when  the MD called him the timing of his near fainting was the time of his HR slowing on his monitor. NO CP  No reversible causes for his CHB were noted, the patient confirming that he was not taking the metoprolol.  He was admitted and underwent implantation of a PPM with details as outlined above.  He  was monitored on telemetry overnight which demonstrated SR, occasional A and V pacing.  Left chest was without hematoma or ecchymosis.  The device was interrogated and found to be functioning normally.  CXR was obtained and demonstrated no pneumothorax status post device implantation.  Wound care, arm mobility, and restrictions were reviewed with the patient.  The patient feels well, no CP or SOB, he was examined by Dr. Ladona Ridgel and considered stable for discharge to home.  Discussed resuming his prior metoprolol, though the patient preferred not to, felt ever since being on it always felt sluggish.  Dr. Ladona Ridgel deferred to Dr. Allyson Sabal at their follow up visit in a few weeks. BP looks OK   Physical Exam: Vitals:   09/29/19 1940 09/30/19 0011 09/30/19 0437 09/30/19 0808  BP: 119/69 (!) 144/83 128/82 (!) 153/77  Pulse: 66 84 80   Resp: (!) 21 19 20    Temp: 97.9 F (36.6 C) 98 F (36.7 C) 97.8 F (36.6 C) 97.9 F (36.6 C)  TempSrc: Oral Oral Oral Oral  SpO2: 93% 99% 98% 97%  Weight:   90.4 kg   Height:  GEN- The patient is well appearing, alert and oriented x 3 today.   HEENT: normocephalic, atraumatic; sclera clear, conjunctiva pink; hearing intact; oropharynx clear; neck supple, no JVP Lungs- CTA b/l, normal work of breathing.  No wheezes, rales, rhonchi Heart- RRR, 2/6 SM, no rubs or gallops, PMI not laterally displaced GI- soft, non-tender, non-distended, bowel sounds present, no hepatosplenomegaly Extremities- no clubbing, cyanosis, or edema MS- no significant deformity or atrophy Skin- warm and dry, no rash or lesion, left chest without hematoma/ecchymosis Psych- euthymic mood, full  affect Neuro- no gross deficits   Labs:   Lab Results  Component Value Date   WBC 8.8 09/28/2019   HGB 13.2 09/28/2019   HCT 39.9 09/28/2019   MCV 93.7 09/28/2019   PLT 234 09/28/2019    Recent Labs  Lab 09/28/19 2219  NA 137  K 4.1  CL 103  CO2 23  BUN 14  CREATININE 1.03  CALCIUM 9.5  GLUCOSE 120*    Discharge Medications:  Allergies as of 09/30/2019   No Known Allergies     Medication List    TAKE these medications   aspirin EC 81 MG tablet Take 81 mg by mouth daily.   atorvastatin 80 MG tablet Commonly known as: LIPITOR Take 1 tablet (80 mg total) by mouth daily at 6 PM.   diphenhydrAMINE 25 MG tablet Commonly known as: BENADRYL Take 25 mg by mouth at bedtime.   ibuprofen 200 MG tablet Commonly known as: ADVIL Take 200 mg by mouth daily as needed for moderate pain.   loratadine 10 MG tablet Commonly known as: CLARITIN Take 10 mg by mouth daily as needed for allergies.   multivitamin with minerals Tabs tablet Take 1 tablet by mouth daily.   nitroGLYCERIN 0.4 MG SL tablet Commonly known as: NITROSTAT Place 1 tablet (0.4 mg total) under the tongue every 5 (five) minutes x 3 doses as needed for chest pain.   ramipril 10 MG capsule Commonly known as: ALTACE Take 10 mg by mouth daily.   sodium chloride 0.65 % Soln nasal spray Commonly known as: OCEAN Place 1 spray into both nostrils as needed for congestion.            Discharge Care Instructions  (From admission, onward)         Start     Ordered   09/30/19 0000  Discharge wound care:       Comments: As noted in discharge instruction   09/30/19 1004          Disposition:  Home Discharge Instructions    Diet - low sodium heart healthy   Complete by: As directed    Discharge wound care:   Complete by: As directed    As noted in discharge instruction   Increase activity slowly   Complete by: As directed       Follow-up Information    [place] [facility] Follow  up.   Specialty: Cardiology Why: 10/09/2019 @ 11:30AM, wound check visit Contact information: [address], [address] [place]       Evans Lance, MD Follow up.   Specialty: Cardiology Why: 01/05/2020 @ 2:00PM Contact information: [address] [place] [address] [phone]        Lorretta Harp, MD Follow up.   Specialties: Cardiology, Radiology Why: 10/21/2019 @ 2:15PM Contact information: [place] [address] [phone]  Duration of Discharge Encounter: Greater than 30 minutes including physician time.  Norma Fredrickson, PA-C 09/30/2019 10:04 AM  EP Attending  Patient seen and examined. Agree with the findings as noted above. The patient is doing well after DDD PM insertion. Interrogation under my direction demonstrates normal DDD PM function. He will be discharged home with usual followup.  Leonia Reeves.D.

## 2019-10-01 ENCOUNTER — Encounter (HOSPITAL_COMMUNITY): Payer: Self-pay | Admitting: Internal Medicine

## 2019-10-06 ENCOUNTER — Emergency Department (HOSPITAL_COMMUNITY): Payer: Medicare PPO

## 2019-10-06 ENCOUNTER — Other Ambulatory Visit: Payer: Self-pay

## 2019-10-06 ENCOUNTER — Emergency Department (HOSPITAL_COMMUNITY)
Admission: EM | Admit: 2019-10-06 | Discharge: 2019-10-06 | Disposition: A | Discharge status: Home / Self Care | Payer: Medicare PPO | Attending: Emergency Medicine | Admitting: Emergency Medicine

## 2019-10-06 ENCOUNTER — Encounter (HOSPITAL_COMMUNITY): Payer: Self-pay | Admitting: Emergency Medicine

## 2019-10-06 DIAGNOSIS — R000 Tachycardia, unspecified: Secondary | ICD-10-CM | POA: Diagnosis present

## 2019-10-06 DIAGNOSIS — Z5321 Procedure and treatment not carried out due to patient leaving prior to being seen by health care provider: Secondary | ICD-10-CM | POA: Diagnosis not present

## 2019-10-06 LAB — BASIC METABOLIC PANEL
Anion gap: 13 (ref 5–15)
BUN: 10 mg/dL (ref 8–23)
CO2: 23 mmol/L (ref 22–32)
Calcium: 9.7 mg/dL (ref 8.9–10.3)
Chloride: 98 mmol/L (ref 98–111)
Creatinine, Ser: 0.96 mg/dL (ref 0.61–1.24)
GFR calc Af Amer: >60 mL/min (ref >60)
GFR calc non Af Amer: >60 mL/min (ref >60)
Glucose, Bld: 118 mg/dL — ABNORMAL HIGH (ref 70–99)
Potassium: 3.9 mmol/L (ref 3.5–5.1)
Sodium: 134 mmol/L — ABNORMAL LOW (ref 135–145)

## 2019-10-06 LAB — CBC
HCT: 42.5 % (ref 39.0–52.0)
Hemoglobin: 14.1 g/dL (ref 13.0–17.0)
MCH: 31.1 pg (ref 26.0–34.0)
MCHC: 33.2 g/dL (ref 30.0–36.0)
MCV: 93.8 fL (ref 80.0–100.0)
Platelets: 218 10*3/uL (ref 150–400)
RBC: 4.53 MIL/uL (ref 4.22–5.81)
RDW: 13.1 % (ref 11.5–15.5)
WBC: 7.7 10*3/uL (ref 4.0–10.5)
nRBC: 0 % (ref 0.0–0.2)

## 2019-10-06 LAB — PROTIME-INR
INR: 1 (ref 0.8–1.2)
Prothrombin Time: 13 seconds (ref 11.4–15.2)

## 2019-10-06 LAB — TROPONIN I (HIGH SENSITIVITY): Troponin I (High Sensitivity): 9 ng/L (ref <18)

## 2019-10-06 MED ORDER — SODIUM CHLORIDE 0.9% FLUSH: 3.0000 mL | Freq: Once | INTRAVENOUS | Status: DC

## 2019-10-06 NOTE — ED Notes (Signed)
Pt informed RN that he is leaving and will call his cardio in the AM.  Pt leaving

## 2019-10-06 NOTE — ED Triage Notes (Signed)
Patient reports palpitations with SOB and lightheaded this afternoon , denies chest pain , no emesis or diaphoresis . Pacemaker implant last Monday , denies fever or cough .

## 2019-10-07 ENCOUNTER — Encounter: Payer: Self-pay | Admitting: Cardiovascular Disease

## 2019-10-07 ENCOUNTER — Ambulatory Visit: Payer: Medicare PPO | Admitting: Cardiovascular Disease

## 2019-10-07 ENCOUNTER — Telehealth: Payer: Self-pay | Admitting: Cardiovascular Disease

## 2019-10-07 DIAGNOSIS — R55 Syncope and collapse: Secondary | ICD-10-CM

## 2019-10-07 DIAGNOSIS — I739 Peripheral vascular disease, unspecified: Secondary | ICD-10-CM | POA: Diagnosis not present

## 2019-10-07 DIAGNOSIS — E782 Mixed hyperlipidemia: Secondary | ICD-10-CM | POA: Diagnosis not present

## 2019-10-07 DIAGNOSIS — I2510 Atherosclerotic heart disease of native coronary artery without angina pectoris: Secondary | ICD-10-CM

## 2019-10-07 DIAGNOSIS — I10 Essential (primary) hypertension: Secondary | ICD-10-CM | POA: Diagnosis not present

## 2019-10-07 MED ORDER — METOPROLOL SUCCINATE ER 25 MG PO TB24
25.0000 mg | ORAL_TABLET | Freq: Every day | ORAL | 3 refills | Status: DC
Start: 2019-10-07 — End: 2020-12-14

## 2019-10-07 NOTE — Patient Instructions (Addendum)
Medication Instructions:  RESTART Toprol XL 25 mg daily  *If you need a refill on your cardiac medications before your next appointment, please call your pharmacy*  Follow-Up: At [facility], you and your health needs are our priority.  As part of our continuing mission to provide you with exceptional heart care, we have created designated Provider Care Teams.  These Care Teams include your primary Cardiologist (physician) and Advanced Practice Providers (APPs -  Physician Assistants and Nurse Practitioners) who all work together to provide you with the care you need, when you need it.  We recommend signing up for the patient portal called "MyChart".  Sign up information is provided on this After Visit Summary.  MyChart is used to connect with patients for Virtual Visits (Telemedicine).  Patients are able to view lab/test results, encounter notes, upcoming appointments, etc.  Non-urgent messages can be sent to your provider as well.   To learn more about what you can do with MyChart, go to [email or web address].    Your next appointment:   6 month(s)  The format for your next appointment:   In Person  Provider:   Nanetta Batty, MD

## 2019-10-07 NOTE — Assessment & Plan Note (Signed)
History of PAD with lower extremity Dopplers performed 01/07/2019 revealing a right ABI 0.68 and left of that was noncompressible.  He had occluded SFAs bilaterally.  I did put put him on Pletal which resulted in some improvement in his claudication symptoms which was recently discontinued when he had syncope secondary to bradycardia.  He does not walk much and really denies claudication currently.

## 2019-10-07 NOTE — Telephone Encounter (Signed)
Patient c/o Palpitations:  High priority if patient c/o lightheadedness, shortness of breath, or chest pain  1) How long have you had palpitations/irregular HR/ Afib? Are you having the symptoms now? Last night  2) Are you currently experiencing lightheadedness, SOB or CP? No  3) Do you have a history of afib (atrial fibrillation) or irregular heart rhythm? No  4) Have you checked your BP or HR? (document readings if available): yes  5) Are you experiencing any other symptoms? No

## 2019-10-07 NOTE — Assessment & Plan Note (Signed)
History of syncope with heart block status post recent permanent transvenous pacemaker insertion by Dr. Ladona Ridgel 09/29/2019.  His site looks clean and dry.  He has been complaining of some tachypalpitations and probably needs his pacemaker interrogated.  I will reach out to Dr. Ladona Ridgel to facilitate this.

## 2019-10-07 NOTE — Addendum Note (Signed)
Addended by: Johney Frame A on: 10/07/2019 05:34 PM   Modules accepted: Orders

## 2019-10-07 NOTE — Telephone Encounter (Signed)
Biotronik PPM implanted 09/29/19 due to AV block.    Report from Biotronik device as of 0114 this morning shows mean PVC/ hour increased to 2 in last 24 hours and increase in Ventricular pacing.  As mentioned pt has appt with Dr. Allyson Sabal this afternoon.  He also has a scheduled wound check/ device clinic appt on 6/24 at [address]

## 2019-10-07 NOTE — Progress Notes (Signed)
Actually arranged    10/07/2019 Glenn Collins   12-Jul-1945  [id number]  Primary Physician Patient, No Pcp Per Primary Cardiologist: Lorretta Harp MD Renae Gloss  HPI:  Glenn Collins is a 74 y.o.  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   I last saw him in the office 04/22/2019.  He does have a history of CAD status post RCA and LAD stenting in 2007 with 3 intervention in 2017 of the RCA for in-stent restenosis. History of hypertension and hyperlipidemia. She never had a heart attack or stroke. He denies chest pain or shortness of breath. He smoked remotely but stopped 20 years ago. He has a history of claudication which is fairly symmetric and lifestyle limiting especially walking up an incline. He was evaluated in [place] and told he had "blockages".  I did begin him on Pletal which has resulted in significant improvement in his claudication symptoms.  He had carotid Dopplers that showed no evidence of ICA stenosis.  He apparently had several syncopal episodes and was noted to be bradycardic.  His beta-blocker was held but he continued to have bradycardia related syncope and ultimately underwent dual-chamber permanent transvenous pacemaker insertion by Dr. Lovena Le 1 week ago.  His Pletal was discontinued at that time as the plus his beta-blocker.  He is noticing tachypalpitations since that time.    Current Meds  Medication Sig  . aspirin EC 81 MG tablet Take 81 mg by mouth daily.  Marland Kitchen atorvastatin (LIPITOR) 80 MG tablet Take 1 tablet (80 mg total) by mouth daily at 6 PM.  . diphenhydrAMINE (BENADRYL) 25 MG tablet Take 25 mg by mouth at bedtime.   Marland Kitchen ibuprofen (ADVIL) 200 MG tablet Take 200 mg by mouth daily as needed for moderate pain.  Marland Kitchen loratadine (CLARITIN) 10  MG tablet Take 10 mg by mouth daily as needed for allergies.   . Multiple Vitamin (MULTIVITAMIN WITH MINERALS) TABS tablet Take 1 tablet by mouth daily.  . nitroGLYCERIN (NITROSTAT) 0.4 MG SL tablet Place 1 tablet (0.4 mg total) under the tongue every 5 (five) minutes x 3 doses as needed for chest pain.  . ramipril (ALTACE) 10 MG capsule Take 10 mg by mouth daily.  . sodium chloride (OCEAN) 0.65 % SOLN nasal spray Place 1 spray into both nostrils as needed for congestion.     No Known Allergies  Social History   Socioeconomic History  . Marital status: Married    Spouse name: Not on file  . Number of children: Not on file  . Years of education: Not on file  . Highest education level: Not on file  Occupational History  . Not on file  Tobacco Use  . Smoking status: Former Research scientist (life sciences)  . Smokeless tobacco: Never Used  Vaping Use  . Vaping Use: Never used  Substance and Sexual Activity  . Alcohol use: Yes  . Drug use: Never  . Sexual activity: Not on file  Other Topics Concern  . Not on file  Social History Narrative  . Not on file   Social Determinants of Health   Financial Resource Strain:   . Difficulty of Paying Living Expenses:   Food Insecurity:   . Worried About Charity fundraiser in the Last Year:   . [place] in the Last Year:  Transportation Needs:   . Freight forwarder (Medical):   Marland Kitchen Lack of Transportation (Non-Medical):   Physical Activity:   . Days of Exercise per Week:   . Minutes of Exercise per Session:   Stress:   . Feeling of Stress :   Social Connections:   . Frequency of Communication with Friends and Family:   . Frequency of Social Gatherings with Friends and Family:   . Attends Religious Services:   . Active Member of Clubs or Organizations:   . Attends Banker Meetings:   Marland Kitchen Marital Status:   Intimate Partner Violence:   . Fear of Current or Ex-Partner:   . Emotionally Abused:   Marland Kitchen Physically Abused:   . Sexually  Abused:      Review of Systems: General: negative for chills, fever, night sweats or weight changes.  Cardiovascular: negative for chest pain, dyspnea on exertion, edema, orthopnea, palpitations, paroxysmal nocturnal dyspnea or shortness of breath Dermatological: negative for rash Respiratory: negative for cough or wheezing Urologic: negative for hematuria Abdominal: negative for nausea, vomiting, diarrhea, bright red blood per rectum, melena, or hematemesis Neurologic: negative for visual changes, syncope, or dizziness All other systems reviewed and are otherwise negative except as noted above.    Blood pressure 130/80, pulse 86, height 6' (1.829 m), weight 196 lb 9.6 oz (89.2 kg), SpO2 98 %.  General appearance: alert and no distress Neck: no adenopathy, no JVD, supple, symmetrical, trachea midline, thyroid not enlarged, symmetric, no tenderness/mass/nodules and Bilateral carotid bruits Lungs: clear to auscultation bilaterally Heart: regular rate and rhythm, S1, S2 normal, no murmur, click, rub or gallop Abdomen: soft, non-tender; bowel sounds normal; no masses,  no organomegaly Pulses: 2+ and symmetric Skin: Skin color, texture, turgor normal. No rashes or lesions Neurologic: Alert and oriented X 3, normal strength and tone. Normal symmetric reflexes. Normal coordination and gait  EKG sinus rhythm at 86 with first-degree AV block and left bundle branch block.  I personally reviewed this EKG.  ASSESSMENT AND PLAN:   PAD (peripheral artery disease) (HCC) History of PAD with lower extremity Dopplers performed 01/07/2019 revealing a right ABI 0.68 and left of that was noncompressible.  He had occluded SFAs bilaterally.  I did put put him on Pletal which resulted in some improvement in his claudication symptoms which was recently discontinued when he had syncope secondary to bradycardia.  He does not walk much and really denies claudication currently.  Hypertension History of essential  hypertension blood pressure measured today 130/80.  He is on ramipril.  When I saw him 6 months ago he was on metoprolol as well which was discontinued because of his bradycardia and syncope.  He also was on this for PVCs and he says he is noticed some skipped beats.  CAD (coronary artery disease) History of CAD status post RCA and LAD intervention in 2007 with intervention in 2017 of his RCA for in-stent restenosis.  He denies chest pain.  Hyperlipidemia History of hyperlipidemia on statin therapy with lipid profile performed 04/29/2019 revealing total cluster 153, LDL 71 and HDL 70.  Syncope History of syncope with heart block status post recent permanent transvenous pacemaker insertion by Dr. Ladona Ridgel 09/29/2019.  His site looks clean and dry.  He has been complaining of some tachypalpitations and probably needs his pacemaker interrogated.  I will reach out to Dr. Ladona Ridgel to facilitate this.      Runell Gess MD FACP,FACC,FAHA, [facility] 10/07/2019 5:18 PM

## 2019-10-07 NOTE — Assessment & Plan Note (Signed)
History of essential hypertension blood pressure measured today 130/80.  He is on ramipril.  When I saw him 6 months ago he was on metoprolol as well which was discontinued because of his bradycardia and syncope.  He also was on this for PVCs and he says he is noticed some skipped beats.

## 2019-10-07 NOTE — Assessment & Plan Note (Signed)
History of CAD status post RCA and LAD intervention in 2007 with intervention in 2017 of his RCA for in-stent restenosis.  He denies chest pain.

## 2019-10-07 NOTE — Telephone Encounter (Signed)
Pt recently underwent pacemaker implant on 6/14. Pt report yesterday he starting experiencing symptoms that felt like PVCs around 4 pm. Symptoms subsided but returned around 6 pm and last all evening. Pt report feeling lightheaded and SOB so he went to ER for further evaluations. Pt state he left due to the long wait and preferred to call this morning.   Pt scheduled today at 4 pm with Dr. Allyson Sabal. Will contact device clinic to have pt send a remote transmission for review.

## 2019-10-07 NOTE — Assessment & Plan Note (Signed)
History of hyperlipidemia on statin therapy with lipid profile performed 04/29/2019 revealing total cluster 153, LDL 71 and HDL 70.

## 2019-10-08 ENCOUNTER — Telehealth: Payer: Self-pay

## 2019-10-08 NOTE — Telephone Encounter (Signed)
I told my nurse to restart his BB after the office visit. If this wasn't communicated to the pt please call him and have him restart his prior BB at same dose

## 2019-10-08 NOTE — Telephone Encounter (Signed)
I let the pt know we still have visitor restrictions. I told him as long as he can walk on his own, no cognitive issues, or memory problems, then he would need to come by himself. He asked was there anywhere for her to sit. I told him we have limited seating and if she comes she would have to wait in the car.

## 2019-10-09 ENCOUNTER — Telehealth: Payer: Self-pay | Admitting: *Deleted

## 2019-10-09 ENCOUNTER — Ambulatory Visit (INDEPENDENT_AMBULATORY_CARE_PROVIDER_SITE_OTHER): Payer: Medicare PPO | Admitting: Emergency Medicine

## 2019-10-09 ENCOUNTER — Other Ambulatory Visit: Payer: Self-pay

## 2019-10-09 DIAGNOSIS — I442 Atrioventricular block, complete: Secondary | ICD-10-CM | POA: Diagnosis not present

## 2019-10-09 NOTE — Telephone Encounter (Signed)
Left message for patient to call and schedule Carotid dopplers in September 2021 and a 6 month follow up appointment with Dr. Allyson Sabal

## 2019-10-09 NOTE — Telephone Encounter (Signed)
Per MD's covering nurse for 6/22, pt has been made aware to start Metoprolol 25 mg daily.

## 2019-10-21 ENCOUNTER — Ambulatory Visit: Payer: Medicare PPO | Admitting: Cardiovascular Disease

## 2019-10-22 LAB — CUP PACEART INCLINIC DEVICE CHECK
Brady Statistic RA Percent Paced: 15 %
Brady Statistic RV Percent Paced: 9 %
Date Time Interrogation Session: 20210624115700
Implantable Lead Implant Date: 20210614
Implantable Lead Implant Date: 20210614
Implantable Lead Location: 753859
Implantable Lead Location: 753860
Implantable Lead Model: 377171
Implantable Lead Model: 377171
Implantable Lead Serial Number: 7000244971
Implantable Lead Serial Number: 7000271886
Implantable Pulse Generator Implant Date: 20210614
Lead Channel Impedance Value: 507 Ohm
Lead Channel Impedance Value: 682 Ohm
Lead Channel Pacing Threshold Amplitude: 0.8 V
Lead Channel Pacing Threshold Amplitude: 0.8 V
Lead Channel Pacing Threshold Pulse Width: 0.4 ms
Lead Channel Pacing Threshold Pulse Width: 0.4 ms
Lead Channel Sensing Intrinsic Amplitude: 10.3 mV
Lead Channel Sensing Intrinsic Amplitude: 3 mV
Lead Channel Setting Pacing Amplitude: 3 V
Lead Channel Setting Pacing Amplitude: 3 V
Lead Channel Setting Pacing Pulse Width: 0.4 ms
Pulse Gen Model: 407145
Pulse Gen Serial Number: 69870190

## 2019-10-22 NOTE — Progress Notes (Signed)
Wound check appointment. Steri-strips removed. Wound without redness or edema. Incision edges approximated, wound well healed. Normal device function. Thresholds, sensing, and impedances consistent with implant measurements. Device programmed at 3.5V/auto capture programmed on for extra safety margin until 3 month visit. Histogram distribution appropriate for patient and level of activity. No mode switches or high ventricular rates noted. Patient reports extreme weakness with activity, per industry PVARP extended to 300 ms due to retrograde conduction.  Patient educated about wound care, arm mobility, lifting restrictions. ROV with Dr Ladona Ridgel 01/05/20. Enrolled in remote transmissions and next remote scheduled for 12/30/19 and every 3 months after.

## 2019-10-27 ENCOUNTER — Other Ambulatory Visit: Payer: Self-pay | Admitting: Internal Medicine

## 2019-11-25 ENCOUNTER — Other Ambulatory Visit: Payer: Self-pay | Admitting: Cardiovascular Disease

## 2019-11-30 ENCOUNTER — Other Ambulatory Visit: Payer: Self-pay | Admitting: Cardiovascular Disease

## 2019-12-25 ENCOUNTER — Other Ambulatory Visit: Payer: Self-pay | Admitting: Cardiology

## 2019-12-25 NOTE — Telephone Encounter (Signed)
This is Dr. Berry's pt 

## 2019-12-26 ENCOUNTER — Other Ambulatory Visit: Payer: Self-pay

## 2019-12-26 ENCOUNTER — Ambulatory Visit (HOSPITAL_COMMUNITY)
Admission: RE | Admit: 2019-12-26 | Discharge: 2019-12-26 | Disposition: A | Discharge status: Home / Self Care | Payer: Medicare PPO | Source: Ambulatory Visit | Attending: Cardiology | Admitting: Cardiology

## 2019-12-26 DIAGNOSIS — G458 Other transient cerebral ischemic attacks and related syndromes: Secondary | ICD-10-CM | POA: Diagnosis present

## 2019-12-30 ENCOUNTER — Ambulatory Visit (INDEPENDENT_AMBULATORY_CARE_PROVIDER_SITE_OTHER): Payer: Medicare PPO | Admitting: *Deleted

## 2019-12-30 DIAGNOSIS — I442 Atrioventricular block, complete: Secondary | ICD-10-CM

## 2019-12-31 LAB — CUP PACEART REMOTE DEVICE CHECK
Date Time Interrogation Session: 20210914193717
Implantable Lead Implant Date: 20210614
Implantable Lead Implant Date: 20210614
Implantable Lead Location: 753859
Implantable Lead Location: 753860
Implantable Lead Model: 377171
Implantable Lead Model: 377171
Implantable Lead Serial Number: 7000244971
Implantable Lead Serial Number: 7000271886
Implantable Pulse Generator Implant Date: 20210614
Pulse Gen Model: 407145
Pulse Gen Serial Number: 69870190

## 2020-01-01 NOTE — Progress Notes (Signed)
Remote pacemaker transmission.   

## 2020-01-05 ENCOUNTER — Ambulatory Visit: Payer: Medicare PPO | Admitting: Internal Medicine

## 2020-01-05 ENCOUNTER — Encounter: Payer: Self-pay | Admitting: Internal Medicine

## 2020-01-05 ENCOUNTER — Other Ambulatory Visit: Payer: Self-pay

## 2020-01-05 VITALS — BP 122/76 | HR 93 | Ht 72.0 in | Wt 201.4 lb

## 2020-01-05 DIAGNOSIS — I442 Atrioventricular block, complete: Secondary | ICD-10-CM | POA: Diagnosis not present

## 2020-01-05 DIAGNOSIS — Z950 Presence of cardiac pacemaker: Secondary | ICD-10-CM | POA: Diagnosis not present

## 2020-01-05 NOTE — Progress Notes (Signed)
HPI Glenn Collins returns today for followup. He is a pleasant 74 yo man with high grade heart block, now CHB, syncope, and PAD. He has done well in the interim. If he gets in a hurry he will get lightheaded. No chest pain or sob. No edema.  No Known Allergies   Current Outpatient Medications  Medication Sig Dispense Refill  . aspirin EC 81 MG tablet Take 81 mg by mouth daily.    Marland Kitchen atorvastatin (LIPITOR) 80 MG tablet TAKE ONE TABLET BY MOUTH DAILY AT 6PM 90 tablet 2  . diphenhydrAMINE (BENADRYL) 25 MG tablet Take 25 mg by mouth at bedtime.     Marland Kitchen ibuprofen (ADVIL) 200 MG tablet Take 200 mg by mouth daily as needed for moderate pain.    Marland Kitchen loratadine (CLARITIN) 10 MG tablet Take 10 mg by mouth daily as needed for allergies.     . metoprolol succinate (TOPROL XL) 25 MG 24 hr tablet Take 1 tablet (25 mg total) by mouth daily. 30 tablet 3  . Multiple Vitamin (MULTIVITAMIN WITH MINERALS) TABS tablet Take 1 tablet by mouth daily.    . nitroGLYCERIN (NITROSTAT) 0.4 MG SL tablet Place 1 tablet (0.4 mg total) under the tongue every 5 (five) minutes x 3 doses as needed for chest pain. 25 tablet 1  . ramipril (ALTACE) 10 MG capsule Take 10 mg by mouth daily.    . sodium chloride (OCEAN) 0.65 % SOLN nasal spray Place 1 spray into both nostrils as needed for congestion.     No current facility-administered medications for this visit.     Past Medical History:  Diagnosis Date  . CAD (coronary artery disease) 10/27/2018  . Coronary artery disease   . Hypertension   . PAD (peripheral artery disease) (HCC) 10/27/2018    ROS:   All systems reviewed and negative except as noted in the HPI.   Past Surgical History:  Procedure Laterality Date  . CARDIAC SURGERY    . PACEMAKER IMPLANT N/A 09/29/2019   Procedure: PACEMAKER IMPLANT;  Surgeon: Marinus Maw, MD;  Location: [facility] INVASIVE CV LAB;  Service: Cardiovascular;  Laterality: N/A;     Family History  Problem Relation Age of Onset  .  Cancer Mother   . CVA Father      Social History   Socioeconomic History  . Marital status: Married    Spouse name: Not on file  . Number of children: Not on file  . Years of education: Not on file  . Highest education level: Not on file  Occupational History  . Not on file  Tobacco Use  . Smoking status: Former Games developer  . Smokeless tobacco: Never Used  Vaping Use  . Vaping Use: Never used  Substance and Sexual Activity  . Alcohol use: Yes  . Drug use: Never  . Sexual activity: Not on file  Other Topics Concern  . Not on file  Social History Narrative  . Not on file   Social Determinants of Health   Financial Resource Strain:   . Difficulty of Paying Living Expenses: Not on file  Food Insecurity:   . Worried About Programme researcher, broadcasting/film/video in the Last Year: Not on file  . Ran Out of Food in the Last Year: Not on file  Transportation Needs:   . Lack of Transportation (Medical): Not on file  . Lack of Transportation (Non-Medical): Not on file  Physical Activity:   . Days of Exercise per Week: Not  on file  . Minutes of Exercise per Session: Not on file  Stress:   . Feeling of Stress : Not on file  Social Connections:   . Frequency of Communication with Friends and Family: Not on file  . Frequency of Social Gatherings with Friends and Family: Not on file  . Attends Religious Services: Not on file  . Active Member of Clubs or Organizations: Not on file  . Attends Banker Meetings: Not on file  . Marital Status: Not on file  Intimate Partner Violence:   . Fear of Current or Ex-Partner: Not on file  . Emotionally Abused: Not on file  . Physically Abused: Not on file  . Sexually Abused: Not on file     BP 122/76   Pulse 93   Ht 6' (1.829 m)   Wt 201 lb 6.4 oz (91.4 kg)   SpO2 97%   BMI 27.31 kg/m   Physical Exam:  Well appearing NAD HEENT: Unremarkable Neck:  No JVD, no thyromegally Lymphatics:  No adenopathy Back:  No CVA tenderness Lungs:   Clear with no wheezes HEART:  Regular rate rhythm, no murmurs, no rubs, no clicks Abd:  soft, positive bowel sounds, no organomegally, no rebound, no guarding Ext:  2 plus pulses, no edema, no cyanosis, no clubbing Skin:  No rashes no nodules Neuro:  CN II through XII intact, motor grossly intact  EKG - nsr with ventricular pacing  DEVICE  Normal device function.  See PaceArt for details.   Assess/Plan: 1. CHB - he is asymptomatic, s/p PPM insertion. 2. PPM - his Biotonik DDD PM is working normally. He has his device reprogrammed to tighten his AV delay. 3. PAD - he denies claudication.  Glenn Gowda Tamelia Michalowski,MD

## 2020-01-05 NOTE — Patient Instructions (Signed)
Medication Instructions:  Your physician recommends that you continue on your current medications as directed. Please refer to the Current Medication list given to you today.  Labwork: None ordered.  Testing/Procedures: None ordered.  Follow-Up: Your physician wants you to follow-up in: one year with Dr. Ladona Ridgel.   You will receive a reminder letter in the mail two months in advance. If you don't receive a letter, please call our office to schedule the follow-up appointment.  Remote monitoring is used to monitor your Pacemaker from home. This monitoring reduces the number of office visits required to check your device to one time per year. It allows [place] to keep an eye on the functioning of your device to ensure it is working properly. You are scheduled for a device check from home on 03/30/2020. You may send your transmission at any time that day. If you have a wireless device, the transmission will be sent automatically. After your physician reviews your transmission, you will receive a postcard with your next transmission date.  Any Other Special Instructions Will Be Listed Below (If Applicable).  If you need a refill on your cardiac medications before your next appointment, please call your pharmacy.

## 2020-01-07 NOTE — Addendum Note (Signed)
Addended by: Dareen Piano on: 01/07/2020 08:28 AM   Modules accepted: Orders

## 2020-01-08 LAB — CUP PACEART INCLINIC DEVICE CHECK
Brady Statistic RA Percent Paced: 21 %
Brady Statistic RV Percent Paced: 67 %
Date Time Interrogation Session: 20210920173338
Implantable Lead Implant Date: 20210614
Implantable Lead Implant Date: 20210614
Implantable Lead Location: 753859
Implantable Lead Location: 753860
Implantable Lead Model: 377171
Implantable Lead Model: 377171
Implantable Lead Serial Number: 7000244971
Implantable Lead Serial Number: 7000271886
Implantable Pulse Generator Implant Date: 20210614
Lead Channel Impedance Value: 468 Ohm
Lead Channel Impedance Value: 741 Ohm
Lead Channel Pacing Threshold Amplitude: 0.5 V
Lead Channel Pacing Threshold Amplitude: 0.5 V
Lead Channel Pacing Threshold Amplitude: 0.6 V
Lead Channel Pacing Threshold Amplitude: 1 V
Lead Channel Pacing Threshold Amplitude: 1 V
Lead Channel Pacing Threshold Amplitude: 1.2 V
Lead Channel Pacing Threshold Pulse Width: 0.4 ms
Lead Channel Pacing Threshold Pulse Width: 0.4 ms
Lead Channel Pacing Threshold Pulse Width: 0.4 ms
Lead Channel Pacing Threshold Pulse Width: 0.4 ms
Lead Channel Pacing Threshold Pulse Width: 0.4 ms
Lead Channel Pacing Threshold Pulse Width: 0.4 ms
Lead Channel Sensing Intrinsic Amplitude: 2.6 mV
Lead Channel Sensing Intrinsic Amplitude: 2.8 mV
Lead Channel Sensing Intrinsic Amplitude: 8.4 mV
Lead Channel Setting Pacing Amplitude: 2 V
Lead Channel Setting Pacing Amplitude: 2.4 V
Lead Channel Setting Pacing Pulse Width: 0.4 ms
Pulse Gen Model: 407145
Pulse Gen Serial Number: 69870190

## 2020-01-12 ENCOUNTER — Telehealth: Payer: Self-pay

## 2020-01-12 DIAGNOSIS — R0989 Other specified symptoms and signs involving the circulatory and respiratory systems: Secondary | ICD-10-CM

## 2020-01-12 DIAGNOSIS — I6523 Occlusion and stenosis of bilateral carotid arteries: Secondary | ICD-10-CM

## 2020-01-12 NOTE — Telephone Encounter (Signed)
Spoke to patient carotid doppler results given.Advised to repeat in 12 months. °

## 2020-01-28 ENCOUNTER — Other Ambulatory Visit: Payer: Self-pay

## 2020-01-28 MED ORDER — RAMIPRIL 10 MG PO CAPS: 10.0000 mg | ORAL_CAPSULE | Freq: Every day | ORAL | 3 refills | Status: DC

## 2020-01-28 NOTE — Telephone Encounter (Signed)
Pt calling requesting a refill on ramipril. This medication has not been refilled. Would Dr Ladona Ridgel like to refill this medication? Please address

## 2020-01-31 ENCOUNTER — Other Ambulatory Visit: Payer: Self-pay

## 2020-01-31 ENCOUNTER — Ambulatory Visit: Payer: Medicare PPO | Attending: Internal Medicine

## 2020-01-31 DIAGNOSIS — Z23 Encounter for immunization: Secondary | ICD-10-CM

## 2020-01-31 NOTE — Progress Notes (Signed)
° °  Covid-19 Vaccination Clinic  Name:  Glenn Collins    MRN: [id number] DOB: [birth year]  01/31/2020  [name] was observed post Covid-19 immunization for 15 minutes without incident. He was provided with Vaccine Information Sheet and instruction to access the V-Safe system.   [name] was instructed to call 911 with any severe reactions post vaccine:  Difficulty breathing   Swelling of face and throat   A fast heartbeat   A bad rash all over body   Dizziness and weakness

## 2020-03-30 ENCOUNTER — Ambulatory Visit (INDEPENDENT_AMBULATORY_CARE_PROVIDER_SITE_OTHER): Payer: Medicare PPO

## 2020-03-30 DIAGNOSIS — I442 Atrioventricular block, complete: Secondary | ICD-10-CM

## 2020-03-30 LAB — CUP PACEART REMOTE DEVICE CHECK
Date Time Interrogation Session: 20211214111809
Implantable Lead Implant Date: 20210614
Implantable Lead Implant Date: 20210614
Implantable Lead Location: 753859
Implantable Lead Location: 753860
Implantable Lead Model: 377171
Implantable Lead Model: 377171
Implantable Lead Serial Number: 7000244971
Implantable Lead Serial Number: 7000271886
Implantable Pulse Generator Implant Date: 20210614
Pulse Gen Model: 407145
Pulse Gen Serial Number: 69870190

## 2020-04-02 ENCOUNTER — Other Ambulatory Visit: Payer: Self-pay

## 2020-04-02 ENCOUNTER — Encounter: Payer: Self-pay | Admitting: Cardiovascular Disease

## 2020-04-02 ENCOUNTER — Ambulatory Visit: Payer: Medicare PPO | Admitting: Cardiovascular Disease

## 2020-04-02 ENCOUNTER — Telehealth: Payer: Self-pay

## 2020-04-02 DIAGNOSIS — R0989 Other specified symptoms and signs involving the circulatory and respiratory systems: Secondary | ICD-10-CM | POA: Diagnosis not present

## 2020-04-02 DIAGNOSIS — I2510 Atherosclerotic heart disease of native coronary artery without angina pectoris: Secondary | ICD-10-CM

## 2020-04-02 DIAGNOSIS — I739 Peripheral vascular disease, unspecified: Secondary | ICD-10-CM

## 2020-04-02 DIAGNOSIS — I10 Essential (primary) hypertension: Secondary | ICD-10-CM | POA: Diagnosis not present

## 2020-04-02 DIAGNOSIS — I442 Atrioventricular block, complete: Secondary | ICD-10-CM

## 2020-04-02 DIAGNOSIS — I771 Stricture of artery: Secondary | ICD-10-CM | POA: Insufficient documentation

## 2020-04-02 DIAGNOSIS — I350 Nonrheumatic aortic (valve) stenosis: Secondary | ICD-10-CM

## 2020-04-02 DIAGNOSIS — E782 Mixed hyperlipidemia: Secondary | ICD-10-CM

## 2020-04-02 NOTE — Assessment & Plan Note (Signed)
History of bilateral carotid bruits with carotid Doppler performed 12/26/2019 showed no evidence of ICA stenosis.

## 2020-04-02 NOTE — Assessment & Plan Note (Signed)
History of CAD status post RCA and LAD stenting in 2007 with 3 interventions since in 2017 for RCA in-stent restenosis.  He denies chest pain or shortness of breath.  He did have a negative Myoview stress test performed 09/08/2019.

## 2020-04-02 NOTE — Patient Instructions (Signed)
Medication Instructions:  Your physician recommends that you continue on your current medications as directed. Please refer to the Current Medication list given to you today.  *If you need a refill on your cardiac medications before your next appointment, please call your pharmacy*   Testing/Procedures: Your physician has requested that you have a carotid duplex. This test is an ultrasound of the carotid arteries in your neck. It looks at blood flow through these arteries that supply the brain with blood. Allow one hour for this exam. There are no restrictions or special instructions. [address]. To be done in Sept 2022    Follow-Up: At [facility], you and your health needs are our priority.  As part of our continuing mission to provide you with exceptional heart care, we have created designated Provider Care Teams.  These Care Teams include your primary Cardiologist (physician) and Advanced Practice Providers (APPs -  Physician Assistants and Nurse Practitioners) who all work together to provide you with the care you need, when you need it.  We recommend signing up for the patient portal called "MyChart".  Sign up information is provided on this After Visit Summary.  MyChart is used to connect with patients for Virtual Visits (Telemedicine).  Patients are able to view lab/test results, encounter notes, upcoming appointments, etc.  Non-urgent messages can be sent to your provider as well.   To learn more about what you can do with MyChart, go to [email or web address].    Your next appointment:   6 month(s)  The format for your next appointment:   In Person  Provider:   You will see one of the following Advanced Practice Providers on your designated Care Team:    Corine Shelter, PA-C  Mal[place], [place]  Edd Fabian, [place]  Then, Nanetta Batty, MD will plan to see you again in 12 month(s).

## 2020-04-02 NOTE — Progress Notes (Signed)
04/02/2020 Glenn Collins   Feb 04, 1946  [id number]  Primary Physician Patient, No Pcp Per Primary Cardiologist: Glenn Gess MD Glenn Collins  HPI:  Glenn Collins is a 74 y.o.   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 He does have a history of CAD status post RCA and LAD stenting in 2007 with 3 intervention in 2017 of the RCA for in-stent restenosis. History of hypertension and hyperlipidemia. She never had a heart attack or stroke. He denies chest pain or shortness of breath. He smoked remotely but stopped 20 years ago. He has a history of claudication which is fairly symmetric and lifestyle limiting especially walking up an incline. He was evaluated in [place] and told he had "blockages".  I did begin him on Pletal which has resulted in significant improvement in his claudication symptoms. He had carotid Dopplers that showed no evidence of ICA stenosis.  He apparently had several syncopal episodes and was noted to be bradycardic.  His beta-blocker was held but he continued to have bradycardia related syncope and ultimately underwent dual-chamber permanent transvenous pacemaker insertion by Dr. Ladona Ridgel 1 week ago.  His Pletal was discontinued at that time as the plus his beta-blocker.    Since I saw him 6 months ago his syncopal episodes have resolved since implantation of his permanent pacemaker.  He has some mild claudication but this is not lifestyle limiting.  His Pletal was discontinued.  He denies chest pain or shortness of breath.  Carotid Dopplers performed 12/26/2019 did show significant left subclavian artery stenosis with atypical left vertebral filling.  He does complain of dizziness but has had this for many  many years since young adulthood.  He denies left upper extremity claudication.    Current Meds  Medication Sig  . aspirin EC 81 MG tablet Take 81 mg by mouth daily.  Marland Kitchen atorvastatin (LIPITOR) 80 MG tablet TAKE ONE TABLET BY MOUTH DAILY AT 6PM  . diphenhydrAMINE (BENADRYL) 25 MG tablet Take 25 mg by mouth at bedtime.  Marland Kitchen ibuprofen (ADVIL) 200 MG tablet Take 200 mg by mouth daily as needed for moderate pain.  Marland Kitchen loratadine (CLARITIN) 10 MG tablet Take 10 mg by mouth daily as needed for allergies.   . metoprolol succinate (TOPROL XL) 25 MG 24 hr tablet Take 1 tablet (25 mg total) by mouth daily.  . Multiple Vitamin (MULTIVITAMIN WITH MINERALS) TABS tablet Take 1 tablet by mouth daily.  . nitroGLYCERIN (NITROSTAT) 0.4 MG SL tablet Place 1 tablet (0.4 mg total) under the tongue every 5 (five) minutes x 3 doses as needed for chest pain.  . ramipril (ALTACE) 10 MG capsule Take 1 capsule (10 mg total) by mouth daily.  . sodium chloride (OCEAN) 0.65 % SOLN nasal spray Place 1 spray into both nostrils as needed for congestion.     No Known Allergies  Social History   Socioeconomic History  . Marital status: Married    Spouse name: Not on file  . Number of children: Not on file  . Years of education: Not on file  . Highest education level: Not on file  Occupational History  . Not on file  Tobacco Use  . Smoking status: Former Games developer  . Smokeless tobacco: Never Used  Vaping Use  .  Vaping Use: Never used  Substance and Sexual Activity  . Alcohol use: Yes  . Drug use: Never  . Sexual activity: Not on file  Other Topics Concern  . Not on file  Social History Narrative  . Not on file   Social Determinants of Health   Financial Resource Strain: Not on file  Food Insecurity: Not on file  Transportation Needs: Not on file  Physical Activity: Not on file  Stress: Not on file  Social Connections: Not on file  Intimate Partner Violence: Not on file     Review of Systems: General:  negative for chills, fever, night sweats or weight changes.  Cardiovascular: negative for chest pain, dyspnea on exertion, edema, orthopnea, palpitations, paroxysmal nocturnal dyspnea or shortness of breath Dermatological: negative for rash Respiratory: negative for cough or wheezing Urologic: negative for hematuria Abdominal: negative for nausea, vomiting, diarrhea, bright red blood per rectum, melena, or hematemesis Neurologic: negative for visual changes, syncope, or dizziness All other systems reviewed and are otherwise negative except as noted above.    Blood pressure 124/66, pulse 82, height 6' (1.829 m), weight 204 lb (92.5 kg).  General appearance: alert and no distress Neck: no adenopathy, no JVD, supple, symmetrical, trachea midline, thyroid not enlarged, symmetric, no tenderness/mass/nodules and Bilateral carotid bruits Lungs: clear to auscultation bilaterally Heart: Soft outflow tract murmur consistent with aortic stenosis Extremities: extremities normal, atraumatic, no cyanosis or edema Pulses: Diminished pedal pulses Skin: Skin color, texture, turgor normal. No rashes or lesions Neurologic: Alert and oriented X 3, normal strength and tone. Normal symmetric reflexes. Normal coordination and gait  EKG not performed today  ASSESSMENT AND PLAN:   PAD (peripheral artery disease) (HCC) History of PAD with lower extremity arterial Doppler studies performed 01/07/2019 revealing right ABI of 0.68 and a left which was noncompressible.  He had occluded SFAs bilaterally.  He really denies significant lifestyle limiting claudication.  Hypertension History of essential hypertension a blood pressure measured today at 124/66.  He is on metoprolol and ramipril.  CAD (coronary artery disease) History of CAD status post RCA and LAD stenting in 2007 with 3 interventions since in 2017 for RCA in-stent restenosis.  He denies chest pain or shortness of breath.  He did have a negative Myoview  stress test performed 09/08/2019.  Bilateral carotid bruits History of bilateral carotid bruits with carotid Doppler performed 12/26/2019 showed no evidence of ICA stenosis.  Hyperlipidemia History of hyperlipidemia on statin therapy with lipid profile performed 04/29/2019 revealing total cholesterol 153, LDL 71 and HDL 72.  Complete heart block (HCC) History of complete heart block with syncope status post permanent transvenous pacemaker placed by Dr. Ladona Ridgel in June of this year.  He said no further symptoms.  Stenosis of left subclavian artery (HCC) Recent Dopplers performed 12/26/2019 revealed significant left subclavian artery stenosis with turbulent vertebral blood flow but only a 10 mm upper extremity blood pressure differential.  He says he has had dizziness for years and does not complain of left upper extremity claudication.  His syncopal episodes improved with with permanent transvenous pacemaker insertion for complete heart block however.  We will continue to follow this noninvasively.  Aortic stenosis 2D echo performed 09/08/2019 showed mild aortic stenosis.  We will recheck a 2D echo in May of next year.      Glenn Gess MD FACP,FACC,FAHA, [facility] 04/02/2020 11:03 AM

## 2020-04-02 NOTE — Assessment & Plan Note (Signed)
History of complete heart block with syncope status post permanent transvenous pacemaker placed by Dr. Ladona Ridgel in June of this year.  He said no further symptoms.

## 2020-04-02 NOTE — Assessment & Plan Note (Signed)
Recent Dopplers performed 12/26/2019 revealed significant left subclavian artery stenosis with turbulent vertebral blood flow but only a 10 mm upper extremity blood pressure differential.  He says he has had dizziness for years and does not complain of left upper extremity claudication.  His syncopal episodes improved with with permanent transvenous pacemaker insertion for complete heart block however.  We will continue to follow this noninvasively.

## 2020-04-02 NOTE — Telephone Encounter (Signed)
Called pt on the phone. After pt left Dr. Allyson Sabal decided that he would like the pt to get a 2D Echo for aortic stenosis in May of 2022.  Pt verbalizes understanding. Orders placed.

## 2020-04-02 NOTE — Assessment & Plan Note (Signed)
History of hyperlipidemia on statin therapy with lipid profile performed 04/29/2019 revealing total cholesterol 153, LDL 71 and HDL 72.

## 2020-04-02 NOTE — Assessment & Plan Note (Signed)
History of PAD with lower extremity arterial Doppler studies performed 01/07/2019 revealing right ABI of 0.68 and a left which was noncompressible.  He had occluded SFAs bilaterally.  He really denies significant lifestyle limiting claudication.

## 2020-04-02 NOTE — Addendum Note (Signed)
Addended by: Bernita Buffy on: 04/02/2020 11:15 AM   Modules accepted: Orders

## 2020-04-02 NOTE — Assessment & Plan Note (Signed)
2D echo performed 09/08/2019 showed mild aortic stenosis.  We will recheck a 2D echo in May of next year.

## 2020-04-02 NOTE — Assessment & Plan Note (Signed)
History of essential hypertension a blood pressure measured today at 124/66.  He is on metoprolol and ramipril.

## 2020-04-14 NOTE — Progress Notes (Signed)
Remote pacemaker transmission.   

## 2020-06-29 ENCOUNTER — Ambulatory Visit (INDEPENDENT_AMBULATORY_CARE_PROVIDER_SITE_OTHER): Payer: Medicare PPO

## 2020-06-29 DIAGNOSIS — I442 Atrioventricular block, complete: Secondary | ICD-10-CM | POA: Diagnosis not present

## 2020-06-29 LAB — CUP PACEART REMOTE DEVICE CHECK
Date Time Interrogation Session: 20220315101745
Implantable Lead Implant Date: 20210614
Implantable Lead Implant Date: 20210614
Implantable Lead Location: 753859
Implantable Lead Location: 753860
Implantable Lead Model: 377171
Implantable Lead Model: 377171
Implantable Lead Serial Number: 7000244971
Implantable Lead Serial Number: 7000271886
Implantable Pulse Generator Implant Date: 20210614
Pulse Gen Model: 407145
Pulse Gen Serial Number: 69870190

## 2020-07-07 NOTE — Progress Notes (Signed)
Remote pacemaker transmission.   

## 2020-07-11 IMAGING — DX DG CHEST 2V
2 series · 2 of 2 positions shown · non-contrast
Comparison: Radiograph and CTA 09/08/2019

CLINICAL DATA: Chest pain.

EXAM:
CHEST - 2 VIEW

[chest pa]
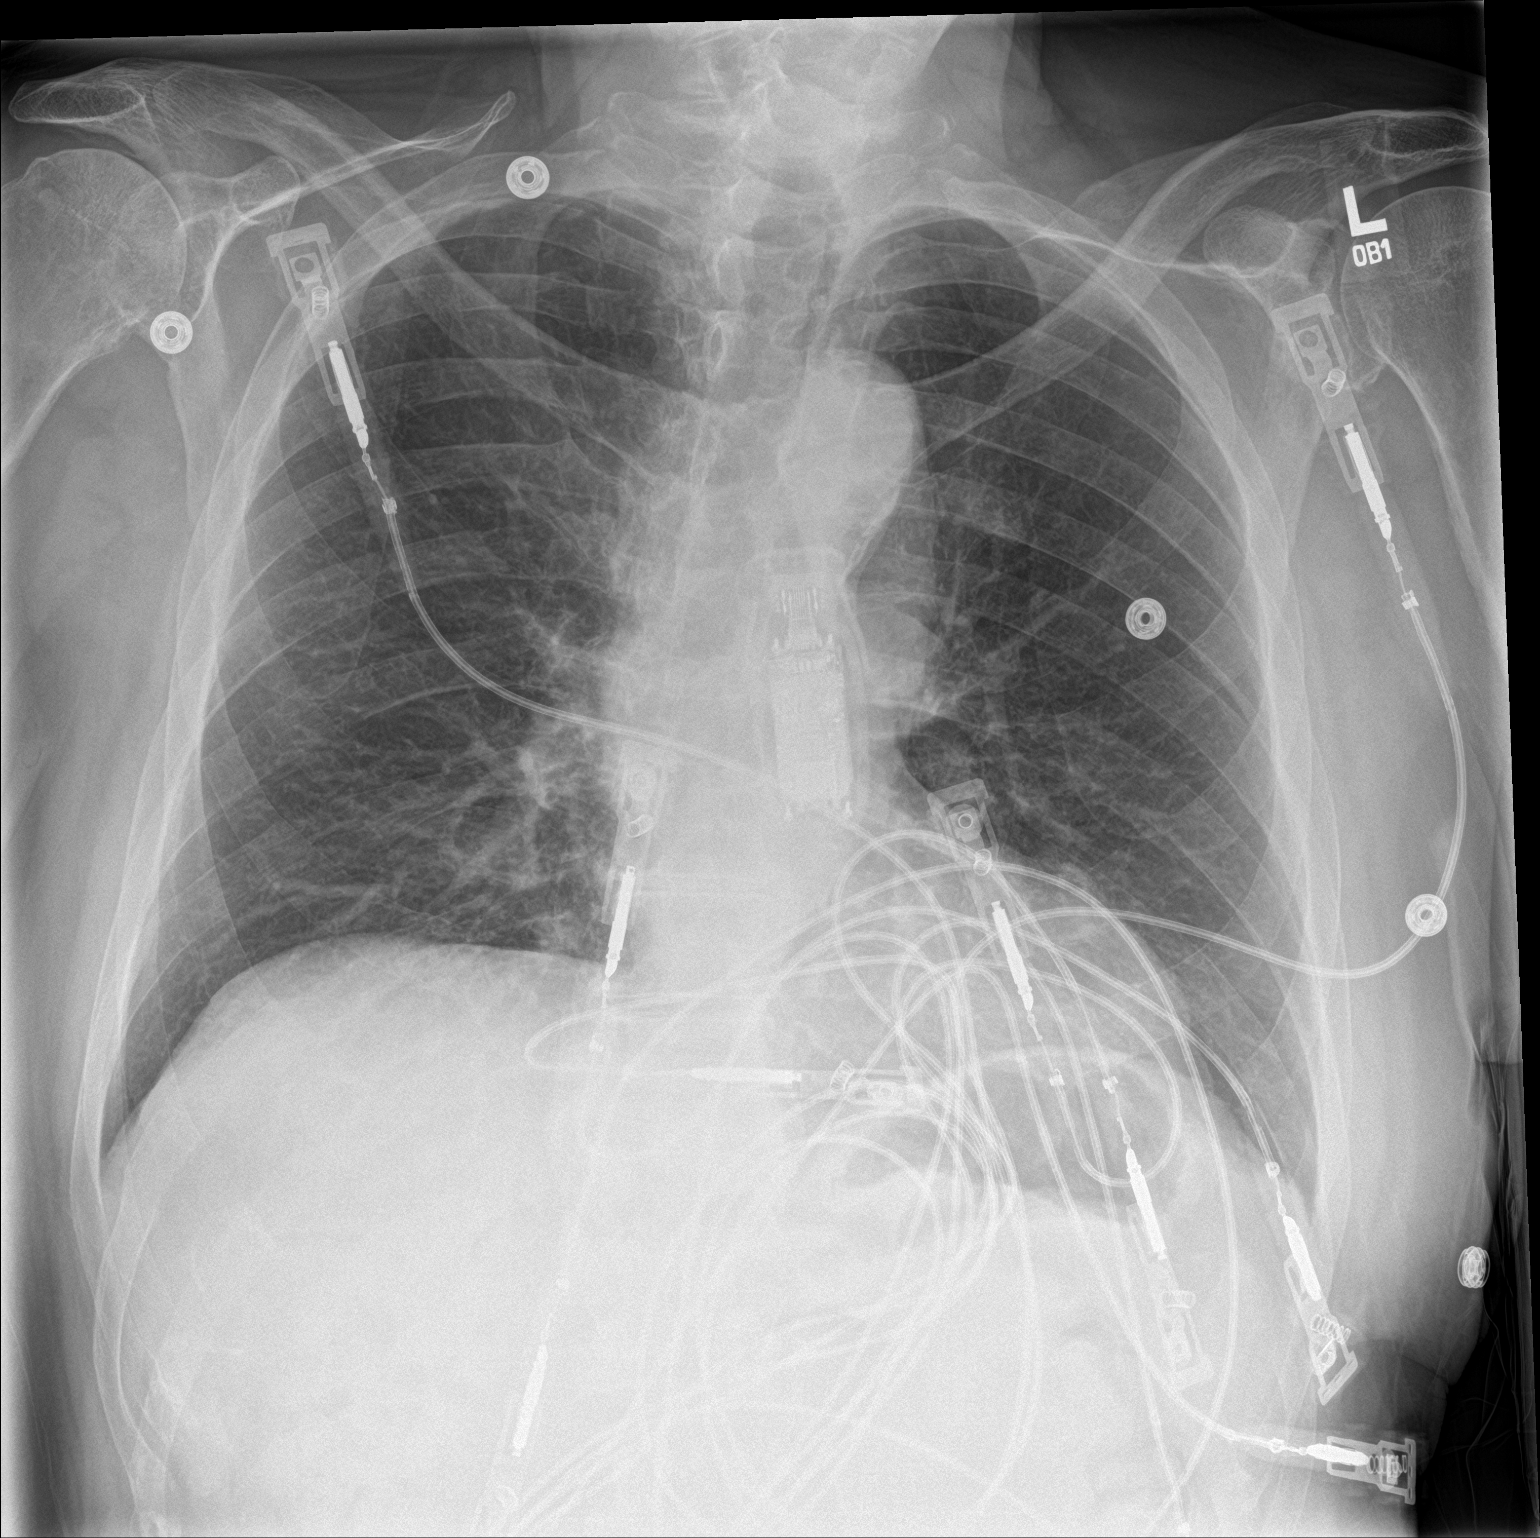

[chest lat]
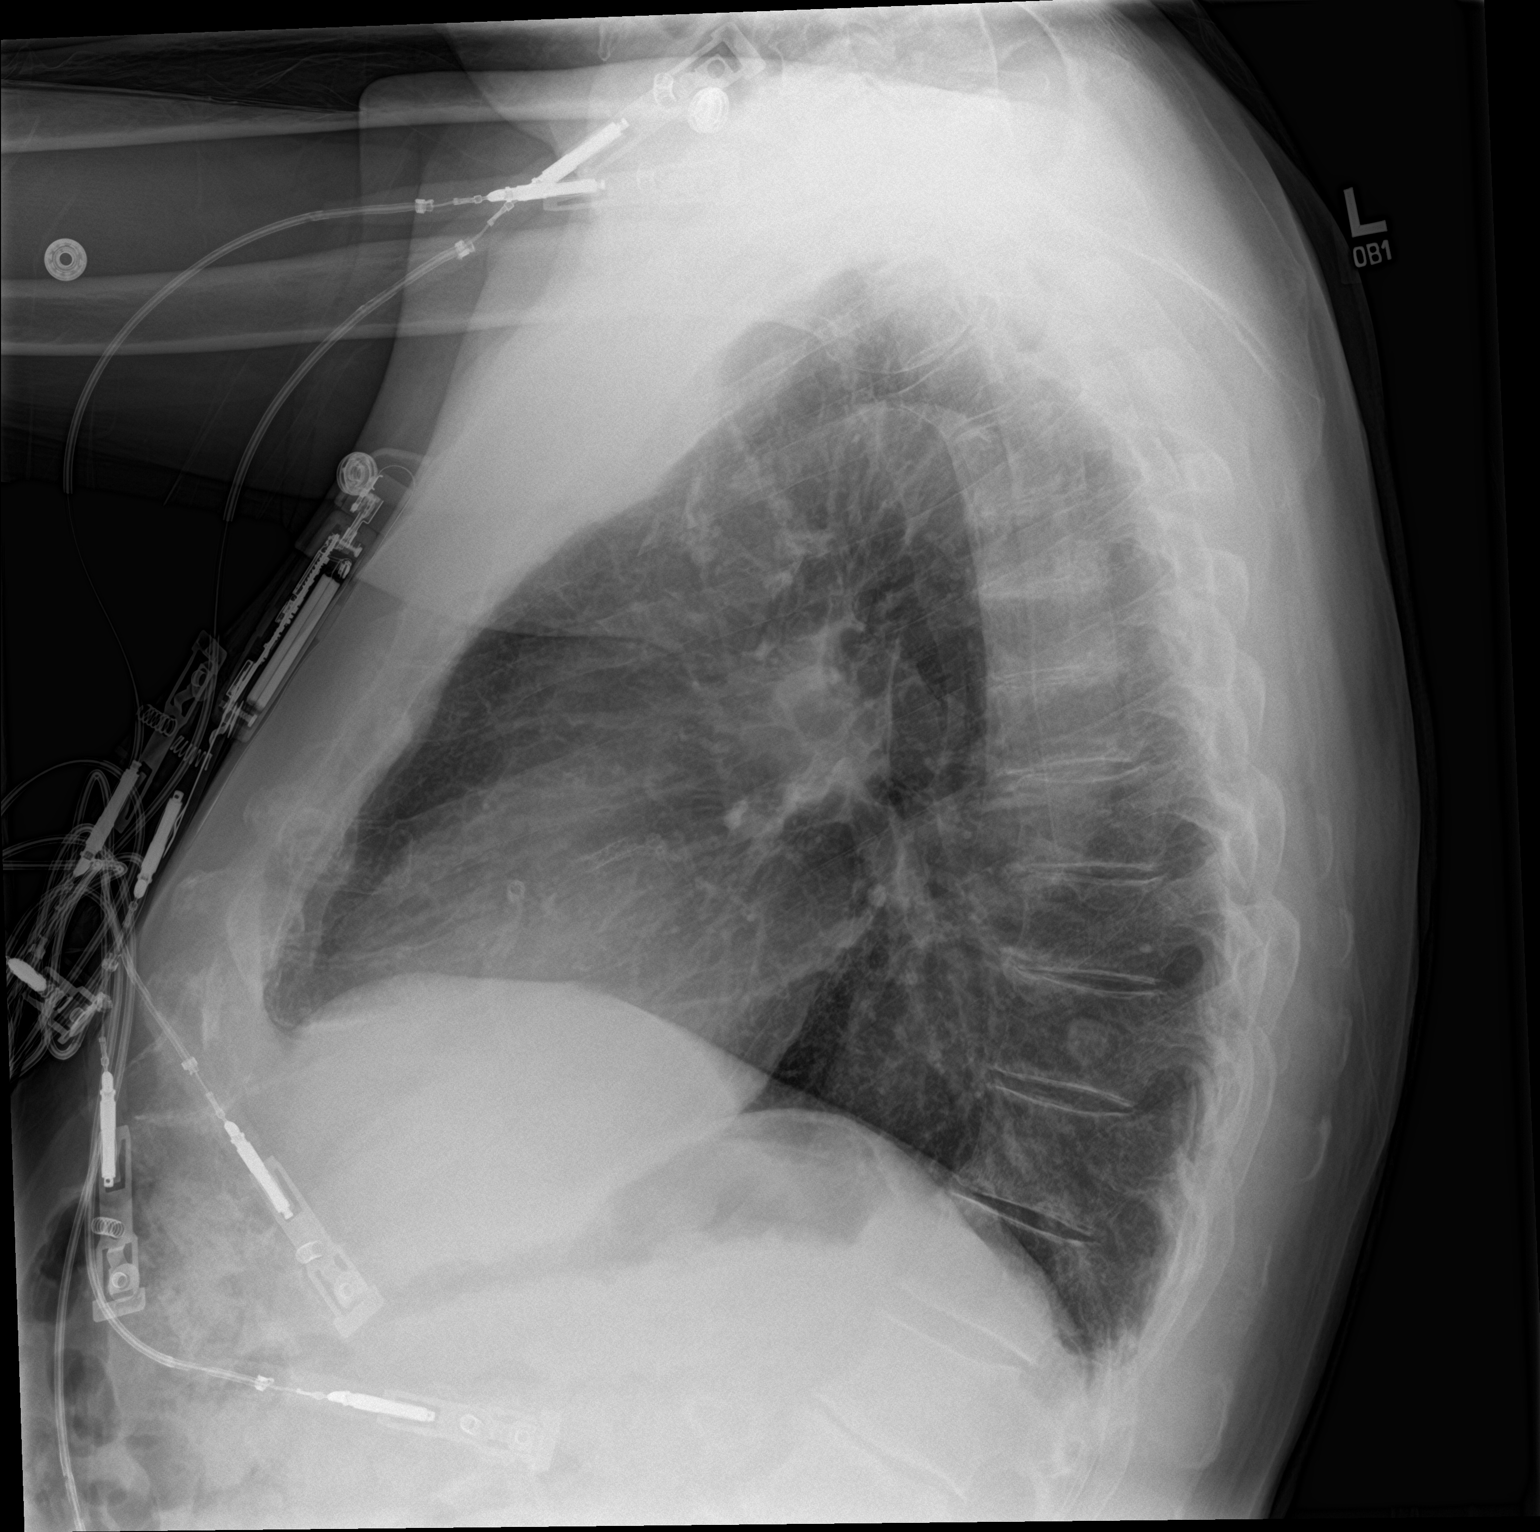

[2 of 2 positions shown; findings below may reference images not displayed]

FINDINGS: The cardiomediastinal contours are unchanged. Aortic
atherosclerosis. Coronary artery calcification versus stent. The
lungs are clear. Pulmonary vasculature is normal. No consolidation,
pleural effusion, or pneumothorax. No acute osseous abnormalities
are seen. Multiple overlying monitoring devices. Battery pack
overlies the chest.
IMPRESSION: No acute chest findings.

Aortic Atherosclerosis ([id number]).

## 2020-07-17 IMAGING — CR DG CHEST 2V
2 series · 2 of 2 positions shown · non-contrast
Comparison: 09/22/2019

CLINICAL DATA: Cardiac palpitations

EXAM:
CHEST - 2 VIEW

[chest pa]
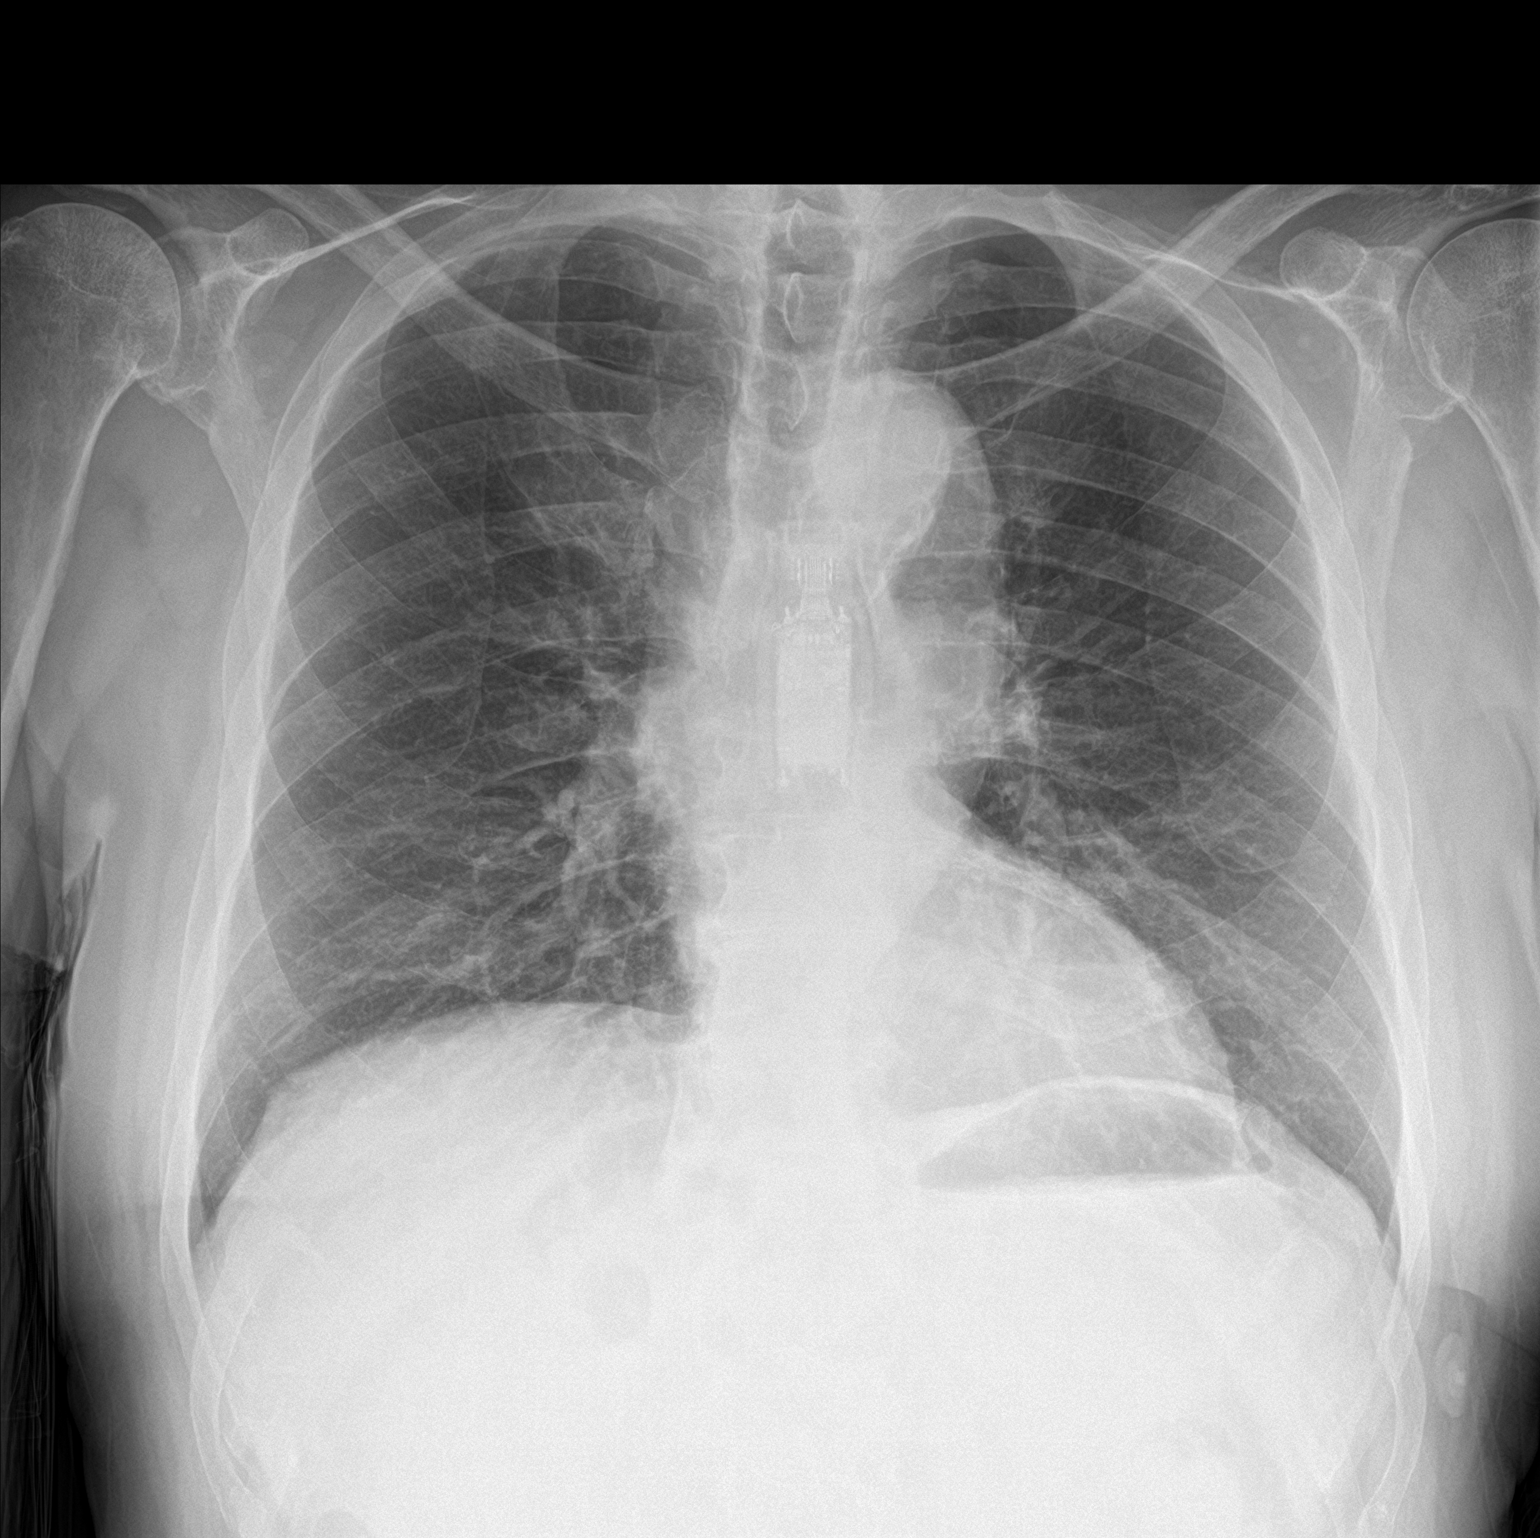

[chest lat]
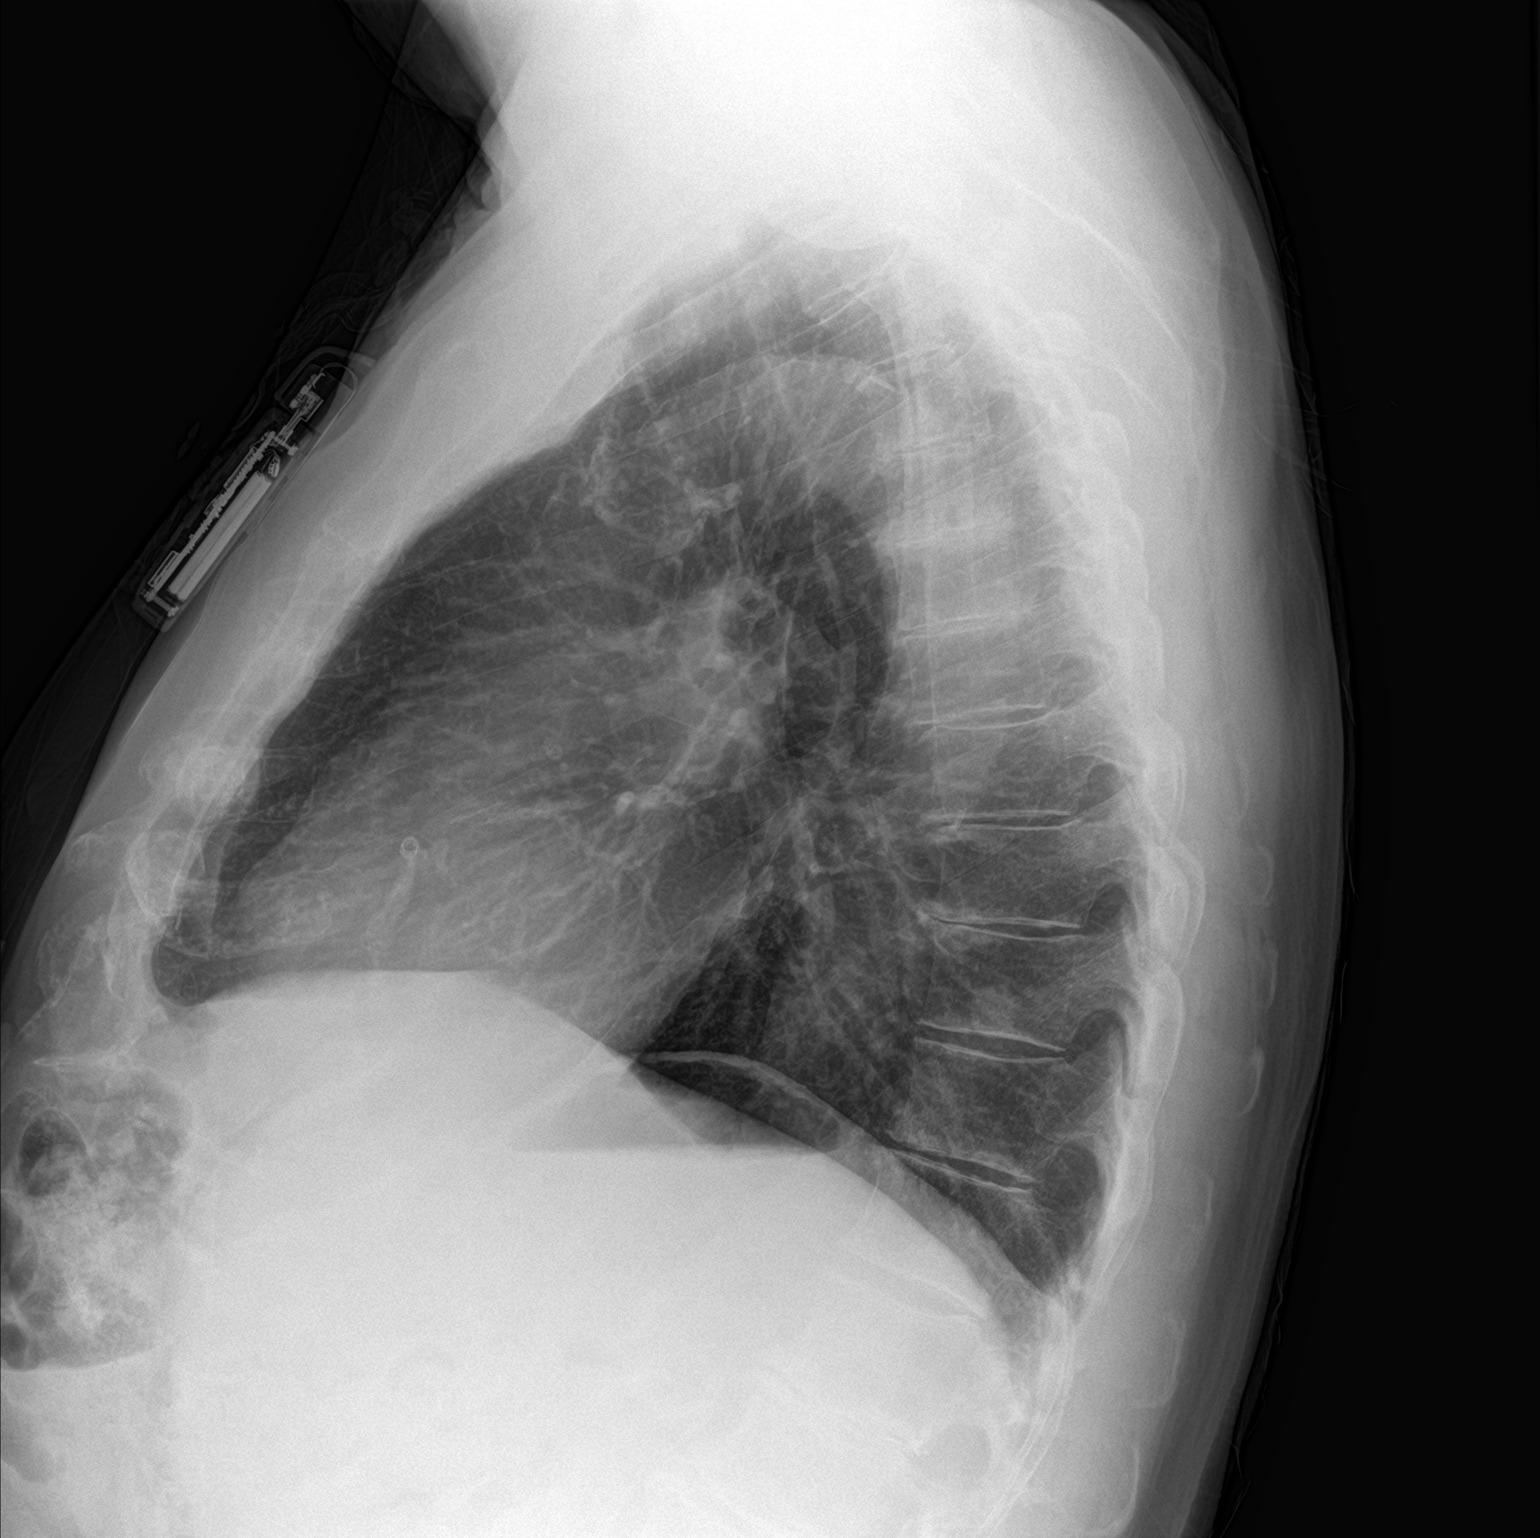

[2 of 2 positions shown; findings below may reference images not displayed]

FINDINGS: Cardiac shadow is within normal limits. Loop recorder is noted.
Lungs are well aerated bilaterally. No focal infiltrate or sizable
effusion is seen. No bony abnormality is noted.
IMPRESSION: No active cardiopulmonary disease.

## 2020-08-16 ENCOUNTER — Ambulatory Visit (HOSPITAL_COMMUNITY): Payer: Medicare PPO | Attending: Cardiovascular Disease

## 2020-08-16 ENCOUNTER — Other Ambulatory Visit: Payer: Self-pay

## 2020-08-16 DIAGNOSIS — I350 Nonrheumatic aortic (valve) stenosis: Secondary | ICD-10-CM | POA: Insufficient documentation

## 2020-08-16 LAB — ECHOCARDIOGRAM COMPLETE
AR max vel: 1.45 cm2
AV Area VTI: 1.44 cm2
AV Area mean vel: 1.42 cm2
AV Mean grad: 17 mmHg
AV Peak grad: 32 mmHg
Ao pk vel: 2.83 m/s
Area-P 1/2: 4.71 cm2
P 1/2 time: 457 msec
S' Lateral: 2.9 cm

## 2020-09-24 ENCOUNTER — Other Ambulatory Visit: Payer: Self-pay

## 2020-09-24 MED ORDER — ATORVASTATIN CALCIUM 80 MG PO TABS: ORAL_TABLET | ORAL | 1 refills | Status: DC

## 2020-09-28 ENCOUNTER — Ambulatory Visit (INDEPENDENT_AMBULATORY_CARE_PROVIDER_SITE_OTHER): Payer: Medicare PPO

## 2020-09-28 DIAGNOSIS — I442 Atrioventricular block, complete: Secondary | ICD-10-CM

## 2020-09-28 LAB — CUP PACEART REMOTE DEVICE CHECK
Date Time Interrogation Session: 20220614072918
Implantable Lead Implant Date: 20210614
Implantable Lead Implant Date: 20210614
Implantable Lead Location: 753859
Implantable Lead Location: 753860
Implantable Lead Model: 377171
Implantable Lead Model: 377171
Implantable Lead Serial Number: 7000244971
Implantable Lead Serial Number: 7000271886
Implantable Pulse Generator Implant Date: 20210614
Pulse Gen Model: 407145
Pulse Gen Serial Number: 69870190

## 2020-10-05 ENCOUNTER — Ambulatory Visit: Payer: Medicare PPO | Attending: Internal Medicine

## 2020-10-05 ENCOUNTER — Other Ambulatory Visit (HOSPITAL_BASED_OUTPATIENT_CLINIC_OR_DEPARTMENT_OTHER): Payer: Self-pay

## 2020-10-05 DIAGNOSIS — Z23 Encounter for immunization: Secondary | ICD-10-CM

## 2020-10-05 MED ORDER — COVID-19 MRNA VACC (MODERNA) 100 MCG/0.5ML IM SUSP
INTRAMUSCULAR | 0 refills | Status: DC
  Filled 2020-10-05: qty 0.25, 1d supply, fill #0

## 2020-10-05 NOTE — Progress Notes (Signed)
   Covid-19 Vaccination Clinic  Name:  Jacey Pelc    MRN: [id number] DOB: 1945/07/19  10/05/2020  Mr. Pasley was observed post Covid-19 immunization for 15 minutes without incident. He was provided with Vaccine Information Sheet and instruction to access the V-Safe system.   Mr. Genest was instructed to call 911 with any severe reactions post vaccine: Difficulty breathing  Swelling of face and throat  A fast heartbeat  A bad rash all over body  Dizziness and weakness   Immunizations Administered     Name Date Dose VIS Date Route   Moderna Covid-19 Booster Vaccine 10/05/2020 10:45 AM 0.25 mL 02/04/2020 Intramuscular   Manufacturer: Moderna   Lot: [id number]   NDC: 64383-779-39

## 2020-10-20 NOTE — Progress Notes (Signed)
Remote pacemaker transmission.   

## 2020-12-14 ENCOUNTER — Other Ambulatory Visit: Payer: Self-pay

## 2020-12-14 MED ORDER — METOPROLOL SUCCINATE ER 25 MG PO TB24: 25.0000 mg | ORAL_TABLET | Freq: Every day | ORAL | 1 refills | Status: DC

## 2020-12-23 ENCOUNTER — Ambulatory Visit (HOSPITAL_BASED_OUTPATIENT_CLINIC_OR_DEPARTMENT_OTHER): Payer: Medicare PPO | Admitting: Family Medicine

## 2020-12-23 ENCOUNTER — Other Ambulatory Visit: Payer: Self-pay

## 2020-12-23 ENCOUNTER — Encounter (HOSPITAL_BASED_OUTPATIENT_CLINIC_OR_DEPARTMENT_OTHER): Payer: Self-pay | Admitting: Family Medicine

## 2020-12-23 VITALS — BP 112/72 | HR 83 | Ht 72.0 in | Wt 201.6 lb

## 2020-12-23 DIAGNOSIS — M25551 Pain in right hip: Secondary | ICD-10-CM

## 2020-12-23 DIAGNOSIS — E782 Mixed hyperlipidemia: Secondary | ICD-10-CM

## 2020-12-23 DIAGNOSIS — E785 Hyperlipidemia, unspecified: Secondary | ICD-10-CM | POA: Insufficient documentation

## 2020-12-23 DIAGNOSIS — I10 Essential (primary) hypertension: Secondary | ICD-10-CM | POA: Diagnosis not present

## 2020-12-23 DIAGNOSIS — I38 Endocarditis, valve unspecified: Secondary | ICD-10-CM | POA: Insufficient documentation

## 2020-12-23 NOTE — Progress Notes (Signed)
New Patient Office Visit  Subjective:  Patient ID: Glenn Collins, male    DOB: 1945-12-13  Age: 75 y.o. MRN: [id number]  CC:  Chief Complaint  Patient presents with   Establish Care    Patient seen PCP in the last 3 years, he has been followed by [facility]   Back Pain    Patient is complaining of lower right sided back pain that radiates down into hip, leg, and foot. It causes him to have an unsteady gate. He states he hasn't been able to walk normally for 3-4 months. He has tried OTC topical muscle/joint relief and tylenol arthritis with minimal relief. He denies any injury     HPI Glenn Collins is a 75 year old male presenting to establish in clinic.  Has current concerns and was outlined above.  Reports past medical history of CAD, hypertension.  Back pain: Has been going on for at least 3 to 4 months.  Denies any inciting event or injury.  Denies any recent falls.  Has not had any prior similar issues in the past.  Does have some radiation of pain into right leg.  Symptoms are worse with walking.  Has tried various topical creams as well as Tylenol with only minimal relief.  Denies any bowel or bladder incontinence.  No recent fever, chills, night sweats.  Cardiac disease/hypertension: Follows with cardiology.  Not sure of next cardiology follow-up.  Had echo completed in May or June.  Currently taking ramipril, metoprolol. Patient also underwent dual-chamber permanent transvenous pacemaker insertion late in 2021.  Hyperlipidemia: Currently taking atorvastatin.  Denies any issues with myalgias or muscle aches.  Past Medical History:  Diagnosis Date   Arthritis    CAD (coronary artery disease) 10/27/2018   Coronary artery disease    Heart murmur Since childhood   Hypertension    PAD (peripheral artery disease) (HCC) 10/27/2018    Past Surgical History:  Procedure Laterality Date   CARDIAC SURGERY     HERNIA REPAIR  2012   PACEMAKER IMPLANT N/A 09/29/2019    Procedure: PACEMAKER IMPLANT;  Surgeon: Marinus Maw, MD;  Location: [facility];  Service: Cardiovascular;  Laterality: N/A;    Family History  Problem Relation Age of Onset   Cancer Mother    Hearing loss Mother    CVA Father     Social History   Socioeconomic History   Marital status: Married    Spouse name: Not on file   Number of children: Not on file   Years of education: Not on file   Highest education level: Not on file  Occupational History   Not on file  Tobacco Use   Smoking status: Former    Packs/day: 1.50    Years: 40.00    Pack years: 60.00    Types: Cigarettes    Quit date: 10/15/2000    Years since quitting: 20.2   Smokeless tobacco: Never  Vaping Use   Vaping Use: Never used  Substance and Sexual Activity   Alcohol use: Yes    Alcohol/week: 20.0 standard drinks    Types: 20 Glasses of wine per week   Drug use: Never   Sexual activity: Not Currently    Birth control/protection: Abstinence  Other Topics Concern   Not on file  Social History Narrative   Not on file   Social Determinants of Health   Financial Resource Strain: Not on file  Food Insecurity: Not on file  Transportation Needs: Not on file  Physical Activity: Not on file  Stress: Not on file  Social Connections: Not on file  Intimate Partner Violence: Not on file    Objective:   Today's Vitals: BP 112/72   Pulse 83   Ht 6' (1.829 m)   Wt 201 lb 9.6 oz (91.4 kg)   SpO2 96%   BMI 27.34 kg/m   Physical Exam  75 year old male in no acute distress Cardiovascular exam with regular rate and rhythm Lungs clear to auscultation bilaterally Bilateral hip: Obvious swelling, bruising or erythema: absent Deformity of the shoulder: absent There is tenderness palpation over greater trochanter on the right side Passive ROM: full range of motion Strength: normal/normal Negative FADIR and FABER bilaterally Neurovascular exam: intact  Assessment & Plan:   Problem List Items  Addressed This Visit       Cardiovascular and Mediastinum   Hypertension - Primary (Chronic)    Continue current medications as prescribed Recommend checking blood pressure intermittently at home Recommend following DASH diet        Musculoskeletal and Integument   Greater trochanteric pain syndrome of right lower extremity    Right hip pain most consistent with greater trochanteric pain syndrome Discussed diagnosis and treatment options Proceed with referral to physical therapy, home exercise program as per PT Can continue with conservative measures to help control symptoms      Relevant Orders   Ambulatory referral to Physical Therapy     Other   Hyperlipidemia    Tolerating atorvastatin at present, continue at this time       Outpatient Encounter Medications as of 12/23/2020  Medication Sig   aspirin EC 81 MG tablet Take 81 mg by mouth daily.   atorvastatin (LIPITOR) 80 MG tablet TAKE ONE TABLET BY MOUTH DAILY AT 6PM   COVID-19 mRNA vaccine, Moderna, 100 MCG/0.5ML injection Inject into the muscle.   diphenhydrAMINE (BENADRYL) 25 MG tablet Take 25 mg by mouth at bedtime.   ibuprofen (ADVIL) 200 MG tablet Take 200 mg by mouth daily as needed for moderate pain.   loratadine (CLARITIN) 10 MG tablet Take 10 mg by mouth daily as needed for allergies.    metoprolol succinate (TOPROL XL) 25 MG 24 hr tablet Take 1 tablet (25 mg total) by mouth daily.   Multiple Vitamin (MULTIVITAMIN WITH MINERALS) TABS tablet Take 1 tablet by mouth daily.   nitroGLYCERIN (NITROSTAT) 0.4 MG SL tablet Place 1 tablet (0.4 mg total) under the tongue every 5 (five) minutes x 3 doses as needed for chest pain.   ramipril (ALTACE) 10 MG capsule Take 1 capsule (10 mg total) by mouth daily.   sodium chloride (OCEAN) 0.65 % SOLN nasal spray Place 1 spray into both nostrils as needed for congestion.   No facility-administered encounter medications on file as of 12/23/2020.    Follow-up: No follow-ups on file.   Plan for follow-up about 6 to 8 weeks or sooner as needed.  Isrrael Fluckiger J De [place], MD

## 2020-12-23 NOTE — Patient Instructions (Addendum)
  Medication Instructions:  Your physician recommends that you continue on your current medications as directed. Please refer to the Current Medication list given to you today. --If you need a refill on any your medications before your next appointment, please call your pharmacy first. If no refills are authorized on file call the office.--  Referrals/Procedures/Imaging: A referral has been placed for you to go to Outpatient Physical Therapy at [facility]. Someone from the scheduling department will be in contact with you in regards to coordinating your consultation. If you do not hear from any of the schedulers within 7-10 business days please give our office a call.  [facility] on the 1st Floor Hours of Operation: Monday - Friday 8:00 am - 5:00 pm Closed on weekends and all major holidays (P) [phone]  Follow-Up: Your next appointment:   Your physician recommends that you schedule a follow-up appointment in: 4-6 WEEKS with Dr. de Peru  Thanks for letting us be apart of your health journey!!  Primary Care and Sports Medicine   Dr. Ceasar Mons [place]   We encourage you to activate your patient portal called "MyChart".  Sign up information is provided on this After Visit Summary.  MyChart is used to connect with patients for Virtual Visits (Telemedicine).  Patients are able to view lab/test results, encounter notes, upcoming appointments, etc.  Non-urgent messages can be sent to your provider as well. To learn more about what you can do with MyChart, please visit --  [email or web address].    You will receive a text message or e-mail with a link to a survey about your care and experience with [place] today! We would greatly appreciate your feedback!

## 2020-12-28 ENCOUNTER — Other Ambulatory Visit: Payer: Self-pay

## 2020-12-28 ENCOUNTER — Ambulatory Visit (HOSPITAL_COMMUNITY)
Admission: RE | Admit: 2020-12-28 | Discharge: 2020-12-28 | Disposition: A | Discharge status: Home / Self Care | Payer: Medicare PPO | Source: Ambulatory Visit | Attending: Internal Medicine | Admitting: Internal Medicine

## 2020-12-28 ENCOUNTER — Other Ambulatory Visit (HOSPITAL_COMMUNITY): Payer: Self-pay | Admitting: Cardiovascular Disease

## 2020-12-28 ENCOUNTER — Ambulatory Visit (INDEPENDENT_AMBULATORY_CARE_PROVIDER_SITE_OTHER): Payer: Medicare PPO

## 2020-12-28 DIAGNOSIS — I442 Atrioventricular block, complete: Secondary | ICD-10-CM | POA: Diagnosis not present

## 2020-12-28 DIAGNOSIS — R0989 Other specified symptoms and signs involving the circulatory and respiratory systems: Secondary | ICD-10-CM | POA: Diagnosis present

## 2020-12-28 DIAGNOSIS — I6523 Occlusion and stenosis of bilateral carotid arteries: Secondary | ICD-10-CM | POA: Diagnosis not present

## 2020-12-28 LAB — CUP PACEART REMOTE DEVICE CHECK
Date Time Interrogation Session: 20220912105909
Implantable Lead Implant Date: 20210614
Implantable Lead Implant Date: 20210614
Implantable Lead Location: 753859
Implantable Lead Location: 753860
Implantable Lead Model: 377171
Implantable Lead Model: 377171
Implantable Lead Serial Number: 7000244971
Implantable Lead Serial Number: 7000271886
Implantable Pulse Generator Implant Date: 20210614
Pulse Gen Model: 407145
Pulse Gen Serial Number: 69870190

## 2020-12-29 ENCOUNTER — Encounter (HOSPITAL_BASED_OUTPATIENT_CLINIC_OR_DEPARTMENT_OTHER): Payer: Self-pay | Admitting: Physical Therapy

## 2020-12-29 ENCOUNTER — Ambulatory Visit (HOSPITAL_BASED_OUTPATIENT_CLINIC_OR_DEPARTMENT_OTHER): Payer: Medicare PPO | Attending: Family Medicine | Admitting: Physical Therapy

## 2020-12-29 DIAGNOSIS — M6281 Muscle weakness (generalized): Secondary | ICD-10-CM | POA: Diagnosis present

## 2020-12-29 DIAGNOSIS — M25551 Pain in right hip: Secondary | ICD-10-CM | POA: Diagnosis not present

## 2020-12-29 DIAGNOSIS — R262 Difficulty in walking, not elsewhere classified: Secondary | ICD-10-CM

## 2020-12-29 NOTE — Therapy (Addendum)
OUTPATIENT PHYSICAL THERAPY LOWER EXTREMITY EVALUATION   Patient Name: Glenn Collins MRN: [id number] DOB:07/17/1945, 75 y.o., male Today's Date: 12/29/2020  PCP: Tommi Rumps [place], Buren Kos, MD REFERRING PROVIDER: de [place], Raymond J, MD   PT End of Session - 12/29/20 1429     Visit Number 1    Number of Visits 21    Date for PT Re-Evaluation 03/11/21    Authorization Type [employer] MCR    Progress Note Due on Visit 10    PT Start Time 1345    PT Stop Time 1427    PT Time Calculation (min) 42 min    Activity Tolerance Patient tolerated treatment well    Behavior During Therapy [facility] for tasks assessed/performed             Past Medical History:  Diagnosis Date   Arthritis    CAD (coronary artery disease) 10/27/2018   Coronary artery disease    Heart murmur Since childhood   Hypertension    PAD (peripheral artery disease) (HCC) 10/27/2018   Past Surgical History:  Procedure Laterality Date   CARDIAC SURGERY     HERNIA REPAIR  2012   PACEMAKER IMPLANT N/A 09/29/2019   Procedure: PACEMAKER IMPLANT;  Surgeon: Marinus Maw, MD;  Location: [facility];  Service: Cardiovascular;  Laterality: N/A;   Patient Active Problem List   Diagnosis Date Noted   Valvular heart disease 12/23/2020   Dyslipidemia 12/23/2020   Greater trochanteric pain syndrome of right lower extremity 12/23/2020   Stenosis of left subclavian artery (HCC) 04/02/2020   Aortic stenosis 04/02/2020   Pacemaker 01/05/2020   Complete heart block (HCC) 09/28/2019   Syncope 09/08/2019   Hyperlipidemia 04/22/2019   Bilateral carotid bruits 12/24/2018   PAD (peripheral artery disease) (HCC) 10/27/2018   Hypertension 10/27/2018   CAD (coronary artery disease) 10/27/2018   NSTEMI (non-ST elevated myocardial infarction) 10/27/2018   Acute back pain 10/27/2018    ONSET DATE: 3-4 mo  REFERRING DIAG: Greater Trochanteric pain syndrome of Rt lower extremity  THERAPY DIAG:  Pain in right hip - Plan: PT plan  of care cert/re-cert  Muscle weakness (generalized) - Plan: PT plan of care cert/re-cert  Difficulty in walking, not elsewhere classified - Plan: PT plan of care cert/re-cert  SUBJECTIVE:   SUBJECTIVE STATEMENT: Rt lateral hip pain that run down the side of his leg and stabbing along the front of the knee. Insidious onset. Started feeling a little better and then almost fell and had to catch myself. Denies N/T. Using a topical pain patch butnothing seems to helpw ith walking.  PERTINENT HISTORY: pacemaker  PAIN:  Are you having pain? Yes VAS scale: 2/10 Pain location: lateral Rt hip into groin Pain orientation: Right  PAIN TYPE: aching Pain description: intermittent  Aggravating factors: walking Relieving factors: pain patch and heat at rest  PRECAUTIONS: None  WEIGHT BEARING RESTRICTIONS No  FALLS: Has patient fallen in last 6 months? No, Number of falls: 0   LIVING ENVIRONMENT: Lives with: lives with their spouse Lives in: House/apartment Stairs: Yes; 3 floors Has following equipment at home: Single point cane  PLOF: Independent  PATIENT GOALS decrease pain, walk freely- walk with wife in evenings, was using cane only occaisonally   OBJECTIVE:   DIAGNOSTIC FINDINGS: posterior pelvic tilt, incr thoracic kyphosis          LE AROM/PROM:  A/PROM Right 12/29/2020 Left 12/29/2020  Hip flexion 90   Hip abduction    Hip adduction P!   Hip internal rotation 0 p!   Hip external rotation    Knee flexion    Knee extension    Ankle dorsiflexion    Ankle plantarflexion    Ankle inversion    Ankle eversion     (Blank rows = not tested)  LE MMT:  MMT Right 12/29/2020 Left 12/29/2020  Hip flexion 3-/5 4/5  Hip abduction 3-/5 4/5  Hip extension 3-/5 3-/5  Hip  internal rotation    Hip external rotation    Knee flexion 5/5   Knee extension 5/5   Ankle dorsiflexion 5/5   Ankle plantarflexion    Ankle inversion    Ankle eversion     (Blank rows = not tested)     GAIT: Using SPcC- was on ipsilateral side and requested move to contralateral side. Bil LE turnout. Trunk sidebend and lacking rotation    Today's Treatment:  Hooklying march Hooklying single leg clam red tband Hooklying piriformis stretch Lateral weight shift Wide tandem weight shift- cues for hip abd activation Heel raises- cues for centered weight Self rolling to thigh Standing glut set   PATIENT EDUCATION:  Education details: anatomy of condition, POC, HEP, exercise form/rationale Person educated: Patient Education method: Explanation, Demonstration, Tactile cues, Verbal cues, and Handouts Education comprehension: verbalized understanding, returned demonstration, verbal cues required, tactile cues required, and needs further education   HOME EXERCISE PROGRAM: [email or web address]  ASSESSMENT:  CLINICAL IMPRESSION: Patient is a 75 y.o. M who was seen today for physical therapy evaluation and treatment for right lateral hip and thigh pain of insidious onset about 4 mo ago. Objective impairments include Abnormal gait, decreased activity tolerance, decreased balance, difficulty walking, decreased ROM, decreased strength, impaired flexibility, improper body mechanics, postural dysfunction, and pain. These impairments are limiting patient from community activity and step/stair navigation . Personal factors including  PAD, pacemaker implant  are also affecting patient's functional outcome. Patient will benefit from skilled PT to address above impairments and improve overall function.  REHAB POTENTIAL: Good  CLINICAL DECISION MAKING: Stable/uncomplicated  EVALUATION COMPLEXITY: Low   GOALS: Goals reviewed with patient? Yes  SHORT TERM GOALS:  STG Name Target Date Goal status   1 Pt will demo proper gait pattern with AD Baseline: educated on proper use of SPC today 9/30  INITIAL  2 Pt will be able to obtain a static stance at midline Baseline: favors LLE 01/14/21 INITIAL  LONG TERM GOALS:   LTG Name Target Date Goal status  1 Hip gross strength to 4/5 Baseline:see flowsheet 03/11/21  INITIAL  2 Pt will be able to return to  evening walks with wife Baseline:unable at eval 03/11/21 INITIAL  3 <=3/10 pain with daily ambulation Baseline:severe and limiting at eval 03/11/21  INITIAL  4 Pt will be independent with long term strengthening HEP Baseline:will progress and establish as appropriate 03/11/21 INITIAL  5 Pt will feel comfortable navigating stairs in his home Baseline: has a 3-level home and just avoids the stairs for the most part 03/11/21 Initial             PLAN: PT FREQUENCY: 2x/week  PT DURATION: 10 weeks  PLANNED INTERVENTIONS: Therapeutic exercises, Therapeutic activity, Neuro Muscular re-education, Balance training, Gait training, Patient/Family education, Joint mobilization, Stair training, Aquatic Therapy, Dry Needling, Cryotherapy, Moist heat, Taping, and Manual therapy  PLAN FOR NEXT SESSION: continue with midline stance CKC exercises, review OKC, gait training to reduce LE abd  Prescious Hurless C. Caitlinn Klinker PT, DPT 12/29/20 8:01 PM  REFERRING DIAG: Greater Trochanteric pain syndrome of Rt lower extremity  THERAPY DIAG:  Pain in right hip - Plan: PT plan of care cert/re-cert  Muscle weakness (generalized) - Plan: PT plan of care cert/re-cert  Difficulty in walking, not elsewhere classified - Plan: PT plan of care cert/re-cert What was this (referring dx) caused by? []  Surgery []  Fall [x]  Ongoing issue []  Arthritis []  Other: ____________  Laterality: [x]  Rt []  Lt []  Both  Check all possible CPT codes:      []  97110 (Therapeutic Exercise)  []  (SLP Treatment)  []  97112 (Neuro Re-ed)   []  92526 (Swallowing Treatment)   []   97116 (Gait Training)   []  (Cognitive Training, 1st 15 minutes) []  97140 (Manual Therapy)   []  97130 (Cognitive Training, each add'l 15 minutes)  []  97530 (Therapeutic Activities)  []  Other, List CPT Code ____________    []  97535 (Self Care)       [x]  All codes above (97110 - 97535)  []  97012 (Mechanical Traction)  []  97014 (E-stim Unattended)  []  97032 (E-stim manual)  []  97033 (Ionto)  []  97035 (Ultrasound)  []  97760 (Orthotic Fit) []  97750 (Physical Performance Training) [x]  (Aquatic Therapy) []  97034 (Contrast Bath) []  (Paraffin) []  97597 (Wound Care 1st 20 sq cm) []  97598 (Wound Care each add'l 20 sq cm) []  97016 (Vasopneumatic Device) []   ) []  (Prosthetic Training)

## 2021-01-04 ENCOUNTER — Ambulatory Visit (HOSPITAL_BASED_OUTPATIENT_CLINIC_OR_DEPARTMENT_OTHER): Payer: Medicare PPO | Admitting: Physical Therapy

## 2021-01-04 ENCOUNTER — Encounter (HOSPITAL_BASED_OUTPATIENT_CLINIC_OR_DEPARTMENT_OTHER): Payer: Self-pay | Admitting: Physical Therapy

## 2021-01-04 ENCOUNTER — Other Ambulatory Visit: Payer: Self-pay

## 2021-01-04 DIAGNOSIS — M6281 Muscle weakness (generalized): Secondary | ICD-10-CM

## 2021-01-04 DIAGNOSIS — M25551 Pain in right hip: Secondary | ICD-10-CM

## 2021-01-04 DIAGNOSIS — R262 Difficulty in walking, not elsewhere classified: Secondary | ICD-10-CM

## 2021-01-04 NOTE — Therapy (Signed)
OUTPATIENT PHYSICAL THERAPY TREATMENT NOTE   Patient Name: Glenn Collins MRN: [id number] DOB:[birth year], 75 y.o., male Today's Date: 01/04/2021  PCP: Tommi Rumps [place], Buren Kos, MD REFERRING PROVIDER: de [place], Raymond J, MD   PT End of Session - 01/04/21 1116     Visit Number 2    Number of Visits 21    Date for PT Re-Evaluation 03/11/21    Authorization Type [employer] MCR    Progress Note Due on Visit 10    PT Start Time 1105    PT Stop Time 1145    PT Time Calculation (min) 40 min    Activity Tolerance Patient tolerated treatment well    Behavior During Therapy [facility] for tasks assessed/performed             Past Medical History:  Diagnosis Date   Arthritis    CAD (coronary artery disease) 10/27/2018   Coronary artery disease    Heart murmur Since childhood   Hypertension    PAD (peripheral artery disease) (HCC) 10/27/2018   Past Surgical History:  Procedure Laterality Date   CARDIAC SURGERY     HERNIA REPAIR  2012   PACEMAKER IMPLANT N/A 09/29/2019   Procedure: PACEMAKER IMPLANT;  Surgeon: Marinus Maw, MD;  Location: [facility];  Service: Cardiovascular;  Laterality: N/A;   Patient Active Problem List   Diagnosis Date Noted   Valvular heart disease 12/23/2020   Dyslipidemia 12/23/2020   Greater trochanteric pain syndrome of right lower extremity 12/23/2020   Stenosis of left subclavian artery (HCC) 04/02/2020   Aortic stenosis 04/02/2020   Pacemaker 01/05/2020   Complete heart block (HCC) 09/28/2019   Syncope 09/08/2019   Hyperlipidemia 04/22/2019   Bilateral carotid bruits 12/24/2018   PAD (peripheral artery disease) (HCC) 10/27/2018   Hypertension 10/27/2018   CAD (coronary artery disease) 10/27/2018   NSTEMI (non-ST elevated myocardial infarction) 10/27/2018   Acute back pain 10/27/2018    REFERRING DIAG: REFERRING DIAG: Greater Trochanteric pain syndrome of Rt lower extremity   THERAPY DIAG:  Pain in right hip - Plan: PT plan of care  cert/re-cert   Muscle weakness (generalized) - Plan: PT plan of care cert/re-cert   Difficulty in walking, not elsewhere classified - Plan: PT plan of care cert/re-cert   SUBJECTIVE:    SUBJECTIVE STATEMENT: Pt states the resting is better but walking hurts. Pt states he has a new pain coming down the R leg.                                                                                                                                                                       PERTINENT HISTORY: pacemaker   PAIN:  Are you having pain? Yes VAS scale: 2/10 Pain  location: lateral Rt hip into groin Pain orientation: Right  PAIN TYPE: aching Pain description: intermittent  Aggravating factors: walking Relieving factors: pain patch and heat at rest  THERAPY DIAG:  Pain in right hip  Muscle weakness (generalized)  Difficulty in walking, not elsewhere classified   PRECAUTIONS: PRECAUTIONS: None    PATIENT GOALS decrease pain, walk freely- walk with wife in evenings, was using cane only occaisonally    OBJECTIVE:   TODAY'S TREATMENT: Today's Treatment:  Bridge 5s hold 2x10 Supine march 2x10 Hooklying hip ABD iso GTB 5s 10x Hooklying piriformis stretch 30s 3x Lateral weight shift 20x 5s AP weight shift 20x 5s 2x10 HR with cuing for glute contraction   STM: R gluteals and lateral hip rotators     PATIENT EDUCATION:  Education details: anatomy, exercise progression, DOMS expectations, muscle firing,  envelope of function, HEP Person educated: Patient Education method: Explanation, Demonstration, Tactile cues, Verbal cues, and Handouts Education comprehension: verbalized understanding, returned demonstration, verbal cues required, tactile cues required, and needs further education     HOME EXERCISE PROGRAM: Access Code: [email or web address] URL: [email or web address] Date: 01/04/2021 Prepared by: Zebedee Iba  Exercises Supine Piriformis Stretch with Foot on Ground - 1 x  daily - 7 x weekly - 1 sets - 3 reps - 30 hold Supine March - 1 x daily - 7 x weekly - 3 sets - 10 reps Hooklying Isometric Clamshell - 1 x daily - 7 x weekly - 2 sets - 10 reps - 3 hold Side to Side Weight Shift with Unilateral Counter Support - 1 x daily - 7 x weekly - 2 sets - 10 reps - 5 hold Heel Raise - 1 x daily - 7 x weekly - 2 sets - 15 reps  ASSESSMENT:   CLINICAL IMPRESSION:  Pt presents with antalgic gait pattern and R LE off-weighting with gait. Pt able to increase stance time and R LE WB following STM and exercise. Though pt still with antalgic gait pattern. Pt responded well to static holds and isometric loading of lateral hip rotators and gluteals. Pt required cuing for slower eccentric movements and longer duration holds for analgesic effect. Plan to continue with strengthening as tolerated. Pt would benefit from continued skilled therapy in order to reach goals and maximize functional R LE strength and ROM for prevention of functional decline and to improve overall function.    REHAB POTENTIAL: Good   CLINICAL DECISION MAKING: Stable/uncomplicated   EVALUATION COMPLEXITY: Low     GOALS: Goals reviewed with patient? Yes   SHORT TERM GOALS:   STG Name Target Date Goal status  1 Pt will demo proper gait pattern with AD Baseline: educated on proper use of SPC today 9/30   INITIAL  2 Pt will be able to obtain a static stance at midline Baseline: favors LLE 01/14/21 INITIAL  LONG TERM GOALS:    LTG Name Target Date Goal status  1 Hip gross strength to 4/5 Baseline:see flowsheet 03/11/21   INITIAL  2 Pt will be able to return to evening walks with wife Baseline:unable at eval 03/11/21 INITIAL  3 <=3/10 pain with daily ambulation Baseline:severe and limiting at eval 03/11/21   INITIAL  4 Pt will be independent with long term strengthening HEP Baseline:will progress and establish as appropriate 03/11/21 INITIAL  5 Pt will feel comfortable navigating stairs in his  home Baseline: has a 3-level home and just avoids the stairs for the most part 03/11/21 Initial  PLAN: PT FREQUENCY: 2x/week   PT DURATION: 10 weeks   PLANNED INTERVENTIONS: Therapeutic exercises, Therapeutic activity, Neuro Muscular re-education, Balance training, Gait training, Patient/Family education, Joint mobilization, Stair training, Aquatic Therapy, Dry Needling, Cryotherapy, Moist heat, Taping, and Manual therapy   PLAN FOR NEXT SESSION: S/L clam with hold, bridge with band, review HEP modifications, longer duration holds   Zebedee Iba PT, DPT 01/04/21 11:47 AM     [facility] [address] [place], [place], [address] Phone: [phone]   Fax:  [phone]  Patient name: Glenn Collins MRN: [id number] DOB: 1946-02-11

## 2021-01-05 NOTE — Assessment & Plan Note (Signed)
Right hip pain most consistent with greater trochanteric pain syndrome Discussed diagnosis and treatment options Proceed with referral to physical therapy, home exercise program as per PT Can continue with conservative measures to help control symptoms

## 2021-01-05 NOTE — Assessment & Plan Note (Signed)
Tolerating atorvastatin at present, continue at this time

## 2021-01-05 NOTE — Assessment & Plan Note (Signed)
Continue current medications as prescribed Recommend checking blood pressure intermittently at home Recommend following DASH diet

## 2021-01-05 NOTE — Progress Notes (Signed)
Remote pacemaker transmission.   

## 2021-01-07 ENCOUNTER — Ambulatory Visit (HOSPITAL_BASED_OUTPATIENT_CLINIC_OR_DEPARTMENT_OTHER): Payer: Medicare PPO | Admitting: Physical Therapy

## 2021-01-07 ENCOUNTER — Encounter (HOSPITAL_BASED_OUTPATIENT_CLINIC_OR_DEPARTMENT_OTHER): Payer: Self-pay | Admitting: Physical Therapy

## 2021-01-07 ENCOUNTER — Other Ambulatory Visit: Payer: Self-pay

## 2021-01-07 DIAGNOSIS — M6281 Muscle weakness (generalized): Secondary | ICD-10-CM

## 2021-01-07 DIAGNOSIS — M25551 Pain in right hip: Secondary | ICD-10-CM

## 2021-01-07 DIAGNOSIS — R262 Difficulty in walking, not elsewhere classified: Secondary | ICD-10-CM

## 2021-01-07 NOTE — Therapy (Signed)
OUTPATIENT PHYSICAL THERAPY TREATMENT NOTE   Patient Name: Glenn Collins MRN: [id number] DOB:09/05/1945, 75 y.o., male 35 Date: 01/07/2021  PCP: de [place], Buren Kos, MD REFERRING PROVIDER: de [place], Raymond J, MD   PT End of Session - 01/07/21 1107     Visit Number 3    Number of Visits 21    Date for PT Re-Evaluation 03/11/21    Authorization Type [employer] MCR    Progress Note Due on Visit 10    PT Start Time 1015    PT Stop Time 1055    PT Time Calculation (min) 40 min    Activity Tolerance Patient tolerated treatment well    Behavior During Therapy [facility] for tasks assessed/performed              Past Medical History:  Diagnosis Date   Arthritis    CAD (coronary artery disease) 10/27/2018   Coronary artery disease    Heart murmur Since childhood   Hypertension    PAD (peripheral artery disease) (HCC) 10/27/2018   Past Surgical History:  Procedure Laterality Date   CARDIAC SURGERY     HERNIA REPAIR  2012   PACEMAKER IMPLANT N/A 09/29/2019   Procedure: PACEMAKER IMPLANT;  Surgeon: Marinus Maw, MD;  [facility]: [facility];  Service: Cardiovascular;  Laterality: N/A;   Patient Active Problem List   Diagnosis Date Noted   Valvular heart disease 12/23/2020   Dyslipidemia 12/23/2020   Greater trochanteric pain syndrome of right lower extremity 12/23/2020   Stenosis of left subclavian artery (HCC) 04/02/2020   Aortic stenosis 04/02/2020   Pacemaker 01/05/2020   Complete heart block (HCC) 09/28/2019   Syncope 09/08/2019   Hyperlipidemia 04/22/2019   Bilateral carotid bruits 12/24/2018   PAD (peripheral artery disease) (HCC) 10/27/2018   Hypertension 10/27/2018   CAD (coronary artery disease) 10/27/2018   NSTEMI (non-ST elevated myocardial infarction) 10/27/2018   Acute back pain 10/27/2018    REFERRING DIAG: REFERRING DIAG: Greater Trochanteric pain syndrome of Rt lower extremity   THERAPY DIAG:  Pain in right hip - Plan: PT plan of care  cert/re-cert   Muscle weakness (generalized) - Plan: PT plan of care cert/re-cert   Difficulty in walking, not elsewhere classified - Plan: PT plan of care cert/re-cert   SUBJECTIVE:    SUBJECTIVE STATEMENT: Pt states that hip felt better after last session but had increased hip pain yesterday potentially due to weather change. Did not experience excessive soreness after last session.     PERTINENT HISTORY: pacemaker   PAIN:  Are you having pain? Yes VAS scale: 1/10 Pain location: lateral Rt hip into groin Pain orientation: Right  PAIN TYPE: aching Pain description: intermittent  Aggravating factors: walking Relieving factors: pain patch and heat at rest  THERAPY DIAG:  Pain in right hip  Muscle weakness (generalized)  Difficulty in walking, not elsewhere classified   PRECAUTIONS: PRECAUTIONS: None    PATIENT GOALS decrease pain, walk freely- walk with wife in evenings, was using cane only occaisonally    OBJECTIVE:   TODAY'S TREATMENT:  Bridge 5s hold 2x10 Supine march 2x10 Hooklying hip ABD iso GTB 5s 10x Clamshell with 5s hold 10x Hooklying piriformis stretch 30s 2x STS mid thigh height 2x10 Standing weight shift R side 5s 10x Standing hip ABD 5x (stopped, too painful) STM: R gluteals and lateral hip rotators, deep ischemic pressure  Previous Treatment:   Supine march 2x10 Hooklying hip ABD iso GTB 5s 10x Hooklying piriformis stretch 30s 3x Lateral  weight shift 20x 5s AP weight shift 20x 5s 2x10 HR with cuing for glute contraction   STM: R gluteals and lateral hip rotators     PATIENT EDUCATION:  Education details: anatomy, exercise progression, DOMS expectations, muscle firing,  envelope of function, HEP Person educated: Patient Education method: Explanation, Demonstration, Tactile cues, Verbal cues, and Handouts Education comprehension: verbalized understanding, returned demonstration, verbal cues required, tactile cues required, and needs  further education     HOME EXERCISE PROGRAM: Access Code: [email or web address] URL: [email or web address] Date: 01/07/2021 Prepared by: Zebedee Iba  Exercises Supine Piriformis Stretch with Foot on Ground - 1 x daily - 7 x weekly - 1 sets - 3 reps - 30 hold Clamshell with Resistance - 1 x daily - 7 x weekly - 2 sets - 10 reps - 5 hold Side to Side Weight Shift with Unilateral Counter Support - 1 x daily - 7 x weekly - 2 sets - 10 reps - 5 hold Heel Raise - 1 x daily - 7 x weekly - 2 sets - 15 reps Supine Bridge - 1 x daily - 7 x weekly - 2 sets - 10 reps - 5 hold   ASSESSMENT:   CLINICAL IMPRESSION:  Pt was able to progress hip strengthening/muscle activation exercises on R LE without increased pain. Pt required VC and TC today for equal WB and weight shift onto R LE today with STS activity. Mid thigh height required to remain under 4/10 pain. Pt unable to perform FWB with UE on R LE at this time. Pt without exacerbation of pain today and had relief of pain following manual and isometric exercise. Plan to continue progress strengthening as tolerated in CKC and R LE WB positions. Pt would benefit from continued skilled therapy in order to reach goals and maximize functional R LE strength and ROM for prevention of functional decline and to improve overall function.    REHAB POTENTIAL: Good   CLINICAL DECISION MAKING: Stable/uncomplicated   EVALUATION COMPLEXITY: Low     GOALS: Goals reviewed with patient? Yes   SHORT TERM GOALS:   STG Name Target Date Goal status  1 Pt will demo proper gait pattern with AD Baseline: educated on proper use of SPC today 9/30   INITIAL  2 Pt will be able to obtain a static stance at midline Baseline: favors LLE 01/14/21 INITIAL  LONG TERM GOALS:    LTG Name Target Date Goal status  1 Hip gross strength to 4/5 Baseline:see flowsheet 03/11/21   INITIAL  2 Pt will be able to return to evening walks with wife Baseline:unable at eval 03/11/21  INITIAL  3 <=3/10 pain with daily ambulation Baseline:severe and limiting at eval 03/11/21   INITIAL  4 Pt will be independent with long term strengthening HEP Baseline:will progress and establish as appropriate 03/11/21 INITIAL  5 Pt will feel comfortable navigating stairs in his home Baseline: has a 3-level home and just avoids the stairs for the most part 03/11/21 Initial                      PLAN: PT FREQUENCY: 2x/week   PT DURATION: 10 weeks   PLANNED INTERVENTIONS: Therapeutic exercises, Therapeutic activity, Neuro Muscular re-education, Balance training, Gait training, Patient/Family education, Joint mobilization, Stair training, Aquatic Therapy, Dry Needling, Cryotherapy, Moist heat, Taping, and Manual therapy   PLAN FOR NEXT SESSION:  review HEP modifications, longer duration holds   Zebedee Iba PT, DPT 01/07/21 11:08 AM  [facility] [address] [place], [place], [address] Phone: [phone]   Fax:  [phone]  Patient name: Glenn Collins MRN: [id number] DOB: [birth year]

## 2021-01-11 ENCOUNTER — Ambulatory Visit (HOSPITAL_BASED_OUTPATIENT_CLINIC_OR_DEPARTMENT_OTHER): Payer: Medicare PPO | Admitting: Physical Therapy

## 2021-01-11 ENCOUNTER — Encounter (HOSPITAL_BASED_OUTPATIENT_CLINIC_OR_DEPARTMENT_OTHER): Payer: Self-pay | Admitting: Physical Therapy

## 2021-01-11 ENCOUNTER — Other Ambulatory Visit: Payer: Self-pay

## 2021-01-11 DIAGNOSIS — R262 Difficulty in walking, not elsewhere classified: Secondary | ICD-10-CM

## 2021-01-11 DIAGNOSIS — M25551 Pain in right hip: Secondary | ICD-10-CM | POA: Diagnosis not present

## 2021-01-11 DIAGNOSIS — M6281 Muscle weakness (generalized): Secondary | ICD-10-CM

## 2021-01-11 NOTE — Therapy (Signed)
OUTPATIENT PHYSICAL THERAPY TREATMENT NOTE   Patient Name: Glenn Collins MRN: [id number] DOB:11-19-1945, 75 y.o., male Today's Date: 01/11/2021  PCP: Tommi Rumps [place], Buren Kos, MD REFERRING PROVIDER: de [place], Raymond J, MD   PT End of Session - 01/11/21 1058     Visit Number 4    Number of Visits 21    Date for PT Re-Evaluation 03/11/21    Authorization Type [employer] MCR    Progress Note Due on Visit 10    PT Start Time 1100    PT Stop Time 1140    PT Time Calculation (min) 40 min    Activity Tolerance Patient tolerated treatment well    Behavior During Therapy [facility] for tasks assessed/performed              Past Medical History:  Diagnosis Date   Arthritis    CAD (coronary artery disease) 10/27/2018   Coronary artery disease    Heart murmur Since childhood   Hypertension    PAD (peripheral artery disease) (HCC) 10/27/2018   Past Surgical History:  Procedure Laterality Date   CARDIAC SURGERY     HERNIA REPAIR  2012   PACEMAKER IMPLANT N/A 09/29/2019   Procedure: PACEMAKER IMPLANT;  Surgeon: Marinus Maw, MD;  [facility]: [facility];  Service: Cardiovascular;  Laterality: N/A;   Patient Active Problem List   Diagnosis Date Noted   Valvular heart disease 12/23/2020   Dyslipidemia 12/23/2020   Greater trochanteric pain syndrome of right lower extremity 12/23/2020   Stenosis of left subclavian artery (HCC) 04/02/2020   Aortic stenosis 04/02/2020   Pacemaker 01/05/2020   Complete heart block (HCC) 09/28/2019   Syncope 09/08/2019   Hyperlipidemia 04/22/2019   Bilateral carotid bruits 12/24/2018   PAD (peripheral artery disease) (HCC) 10/27/2018   Hypertension 10/27/2018   CAD (coronary artery disease) 10/27/2018   NSTEMI (non-ST elevated myocardial infarction) 10/27/2018   Acute back pain 10/27/2018    REFERRING DIAG: REFERRING DIAG: Greater Trochanteric pain syndrome of Rt lower extremity   THERAPY DIAG:  Pain in right hip - Plan: PT plan of care  cert/re-cert   Muscle weakness (generalized) - Plan: PT plan of care cert/re-cert   Difficulty in walking, not elsewhere classified - Plan: PT plan of care cert/re-cert   SUBJECTIVE:    SUBJECTIVE STATEMENT: I think it is helping me out. I am still having trouble walking but I am a lot more comfortable when I am still.    PERTINENT HISTORY: pacemaker   PAIN:  Are you having pain? Yes VAS scale: 1/10 Pain location: lateral Rt hip into groin Pain orientation: Right  PAIN TYPE: tender Pain description: intermittent  Aggravating factors: walking Relieving factors: pain patch and heat at rest  THERAPY DIAG:  Pain in right hip  Muscle weakness (generalized)  Difficulty in walking, not elsewhere classified   PRECAUTIONS: PRECAUTIONS: None    PATIENT GOALS decrease pain, walk freely- walk with wife in evenings, was using cane only occaisonally    OBJECTIVE:   TODAY'S TREATMENT:  9/27    Nu step 5 min L5 LE only  Neutral BOS & single foot fwd- green tband bil arm pull down & alt--all also done with 5lb weights lifting to <90deg flexion  Gait training: in classroom- single bar for support and mirror, working to narrow BOS  2 layer heel lift in Lt shoe & gait training following  Manual: lateral FA distraction in hooklying, roller to Rt quads  Previous: Bridge 5s hold 2x10  Supine march 2x10 Hooklying hip ABD iso GTB 5s 10x Clamshell with 5s hold 10x Hooklying piriformis stretch 30s 2x STS mid thigh height 2x10 Standing weight shift R side 5s 10x Standing hip ABD 5x (stopped, too painful) STM: R gluteals and lateral hip rotators, deep ischemic pressure    Supine march 2x10 Hooklying hip ABD iso GTB 5s 10x Hooklying piriformis stretch 30s 3x Lateral weight shift 20x 5s AP weight shift 20x 5s 2x10 HR with cuing for glute contraction   STM: R gluteals and lateral hip rotators        HOME EXERCISE PROGRAM: Access Code: [email or web address] URL:  [email or web address] Date: 01/07/2021 Prepared by: Zebedee Iba  Exercises Supine Piriformis Stretch with Foot on Ground - 1 x daily - 7 x weekly - 1 sets - 3 reps - 30 hold Clamshell with Resistance - 1 x daily - 7 x weekly - 2 sets - 10 reps - 5 hold Side to Side Weight Shift with Unilateral Counter Support - 1 x daily - 7 x weekly - 2 sets - 10 reps - 5 hold Heel Raise - 1 x daily - 7 x weekly - 2 sets - 15 reps Supine Bridge - 1 x daily - 7 x weekly - 2 sets - 10 reps - 5 hold   ASSESSMENT:   CLINICAL IMPRESSION:  notable functional LLD- Lt shorter  and added 2-layer heel lift to Lt shoe. Pt reported feeling like it was easier to walk. Still has a significant Rt trunk lean in Rt stance phase placing excessive weight into Rt LE. Improved comfort with lateral distraction and will wear the lfit the rest of the day with understanding that he should remove it if pain increases.     REHAB POTENTIAL: Good   CLINICAL DECISION MAKING: Stable/uncomplicated   EVALUATION COMPLEXITY: Low     GOALS: Goals reviewed with patient? Yes   SHORT TERM GOALS:   STG Name Target Date Goal status  1 Pt will demo proper gait pattern with AD Baseline: educated on proper use of SPC today 9/30   INITIAL  2 Pt will be able to obtain a static stance at midline Baseline: favors LLE 01/14/21 INITIAL  LONG TERM GOALS:    LTG Name Target Date Goal status  1 Hip gross strength to 4/5 Baseline:see flowsheet 03/11/21   INITIAL  2 Pt will be able to return to evening walks with wife Baseline:unable at eval 03/11/21 INITIAL  3 <=3/10 pain with daily ambulation Baseline:severe and limiting at eval 03/11/21   INITIAL  4 Pt will be independent with long term strengthening HEP Baseline:will progress and establish as appropriate 03/11/21 INITIAL  5 Pt will feel comfortable navigating stairs in his home Baseline: has a 3-level home and just avoids the stairs for the most part 03/11/21 Initial                       PLAN: PT FREQUENCY: 2x/week   PT DURATION: 10 weeks   PLANNED INTERVENTIONS: Therapeutic exercises, Therapeutic activity, Neuro Muscular re-education, Balance training, Gait training, Patient/Family education, Joint mobilization, Stair training, Aquatic Therapy, Dry Needling, Cryotherapy, Moist heat, Taping, and Manual therapy   PLAN FOR NEXT SESSION:  outcome of heel lift? Evaluate STGs, cont gait NBOS   Sanaa Zilberman C. Laasia Arcos PT, DPT 01/11/21 11:55 AM    [facility] [address] [place], [place], [address] Phone: [phone]   Fax:  [phone]  Patient name: Glenn Burdock  Collins MRN: [id number] DOB: 24-Jun-1945

## 2021-01-13 ENCOUNTER — Other Ambulatory Visit: Payer: Self-pay

## 2021-01-13 ENCOUNTER — Encounter (HOSPITAL_BASED_OUTPATIENT_CLINIC_OR_DEPARTMENT_OTHER): Payer: Self-pay | Admitting: Physical Therapy

## 2021-01-13 ENCOUNTER — Ambulatory Visit (HOSPITAL_BASED_OUTPATIENT_CLINIC_OR_DEPARTMENT_OTHER): Payer: Medicare PPO | Admitting: Physical Therapy

## 2021-01-13 DIAGNOSIS — M6281 Muscle weakness (generalized): Secondary | ICD-10-CM

## 2021-01-13 DIAGNOSIS — M25551 Pain in right hip: Secondary | ICD-10-CM | POA: Diagnosis not present

## 2021-01-13 DIAGNOSIS — R262 Difficulty in walking, not elsewhere classified: Secondary | ICD-10-CM

## 2021-01-13 NOTE — Therapy (Signed)
OUTPATIENT PHYSICAL THERAPY TREATMENT NOTE   Patient Name: Glenn Collins MRN: [id number] DOB:[birth year], 75 y.o., male Today's Date: 01/13/2021  PCP: Tommi Rumps [place], Buren Kos, MD REFERRING PROVIDER: de [place], Raymond J, MD   PT End of Session - 01/13/21 1104     Visit Number 5    Number of Visits 21    Date for PT Re-Evaluation 03/11/21    Authorization Type [employer] MCR    Progress Note Due on Visit 10    PT Start Time 1102    PT Stop Time 1144    PT Time Calculation (min) 42 min    Activity Tolerance Patient tolerated treatment well    Behavior During Therapy [facility] for tasks assessed/performed              Past Medical History:  Diagnosis Date   Arthritis    CAD (coronary artery disease) 10/27/2018   Coronary artery disease    Heart murmur Since childhood   Hypertension    PAD (peripheral artery disease) (HCC) 10/27/2018   Past Surgical History:  Procedure Laterality Date   CARDIAC SURGERY     HERNIA REPAIR  2012   PACEMAKER IMPLANT N/A 09/29/2019   Procedure: PACEMAKER IMPLANT;  Surgeon: Marinus Maw, MD;  Location: [facility];  Service: Cardiovascular;  Laterality: N/A;   Patient Active Problem List   Diagnosis Date Noted   Valvular heart disease 12/23/2020   Dyslipidemia 12/23/2020   Greater trochanteric pain syndrome of right lower extremity 12/23/2020   Stenosis of left subclavian artery (HCC) 04/02/2020   Aortic stenosis 04/02/2020   Pacemaker 01/05/2020   Complete heart block (HCC) 09/28/2019   Syncope 09/08/2019   Hyperlipidemia 04/22/2019   Bilateral carotid bruits 12/24/2018   PAD (peripheral artery disease) (HCC) 10/27/2018   Hypertension 10/27/2018   CAD (coronary artery disease) 10/27/2018   NSTEMI (non-ST elevated myocardial infarction) 10/27/2018   Acute back pain 10/27/2018    REFERRING DIAG: REFERRING DIAG: Greater Trochanteric pain syndrome of Rt lower extremity   THERAPY DIAG:  Pain in right hip - Plan: PT plan of care  cert/re-cert   Muscle weakness (generalized) - Plan: PT plan of care cert/re-cert   Difficulty in walking, not elsewhere classified - Plan: PT plan of care cert/re-cert   SUBJECTIVE:    SUBJECTIVE STATEMENT: I can feel the rain coming, my joints are achey. Lift is making walking easier.    PERTINENT HISTORY: pacemaker   PAIN:  Are you having pain? Yes VAS scale: 2/10 Pain location: lateral Rt hip into groin Pain orientation: Right  PAIN TYPE: ache Pain description: intermittent  Aggravating factors: walking Relieving factors: pain patch and heat at rest  THERAPY DIAG:  Pain in right hip  Muscle weakness (generalized)  Difficulty in walking, not elsewhere classified   PRECAUTIONS: PRECAUTIONS: None    PATIENT GOALS decrease pain, walk freely- walk with wife in evenings, was using cane only occaisonally    OBJECTIVE:   TODAY'S TREATMENT:  9/29  Attempted bike but too much pain in Lt thigh today  Manual: roller to Lt quads & lateral thigh, long axis RLE traction, hooklying Rt FA joint lateral distraction, hooklying IR with long axis post pressure through femur into post capsule  Seated adduction with ball  Nustep 5 min L5 UE & LE following manual therapy   Gait training- narrowing BOS  Rt SLS with Lt foot up on 2" step to activate hip abd group  9/27    Nu step 5  min L5 LE only  Neutral BOS & single foot fwd- green tband bil arm pull down & alt--all also done with 5lb weights lifting to <90deg flexion  Gait training: in classroom- single bar for support and mirror, working to narrow BOS  2 layer heel lift in Lt shoe & gait training following  Manual: lateral FA distraction in hooklying, roller to Rt quads  Previous: Bridge 5s hold 2x10 Supine march 2x10 Hooklying hip ABD iso GTB 5s 10x Clamshell with 5s hold 10x Hooklying piriformis stretch 30s 2x STS mid thigh height 2x10 Standing weight shift R side 5s 10x Standing hip ABD 5x (stopped, too painful) STM:  R gluteals and lateral hip rotators, deep ischemic pressure    Supine march 2x10 Hooklying hip ABD iso GTB 5s 10x Hooklying piriformis stretch 30s 3x Lateral weight shift 20x 5s AP weight shift 20x 5s 2x10 HR with cuing for glute contraction   STM: R gluteals and lateral hip rotators        HOME EXERCISE PROGRAM: Access Code: [email or web address] URL: [email or web address] Date: 01/07/2021 Prepared by: Zebedee Iba  Exercises Supine Piriformis Stretch with Foot on Ground - 1 x daily - 7 x weekly - 1 sets - 3 reps - 30 hold Clamshell with Resistance - 1 x daily - 7 x weekly - 2 sets - 10 reps - 5 hold Side to Side Weight Shift with Unilateral Counter Support - 1 x daily - 7 x weekly - 2 sets - 10 reps - 5 hold Heel Raise - 1 x daily - 7 x weekly - 2 sets - 15 reps Supine Bridge - 1 x daily - 7 x weekly - 2 sets - 10 reps - 5 hold   ASSESSMENT:   CLINICAL IMPRESSION:  Decreased concordant pain with manual therapy today. Notable limitation in post hip capsule- mobilization range limited to IR with foot on ground today.     REHAB POTENTIAL: Good   CLINICAL DECISION MAKING: Stable/uncomplicated   EVALUATION COMPLEXITY: Low     GOALS: Goals reviewed with patient? Yes   SHORT TERM GOALS:   STG Name Target Date Goal status  1 Pt will demo proper gait pattern with AD Baseline: I havent felt like I needed AD 9/30   achieved  2 Pt will be able to obtain a static stance at midline Baseline: able to demo 01/14/21 achieved  LONG TERM GOALS:    LTG Name Target Date Goal status  1 Hip gross strength to 4/5 Baseline:see flowsheet 03/11/21   INITIAL  2 Pt will be able to return to evening walks with wife Baseline:unable at eval 03/11/21 INITIAL  3 <=3/10 pain with daily ambulation Baseline:severe and limiting at eval 03/11/21   INITIAL  4 Pt will be independent with long term strengthening HEP Baseline:will progress and establish as appropriate 03/11/21 INITIAL  5 Pt  will feel comfortable navigating stairs in his home Baseline: has a 3-level home and just avoids the stairs for the most part 03/11/21 Initial                      PLAN: PT FREQUENCY: 2x/week   PT DURATION: 10 weeks   PLANNED INTERVENTIONS: Therapeutic exercises, Therapeutic activity, Neuro Muscular re-education, Balance training, Gait training, Patient/Family education, Joint mobilization, Stair training, Aquatic Therapy, Dry Needling, Cryotherapy, Moist heat, Taping, and Manual therapy   PLAN FOR NEXT SESSION:  cont post hip mobs, progress CKC hip abductor group activation   Jai Steil C. Conner Muegge PT,  DPT 01/13/21 12:44 PM    [facility] [address] [place], [place], [address] Phone: [phone]   Fax:  [phone]  Patient name: Glenn Collins MRN: [id number] DOB: 1945-08-19

## 2021-01-18 ENCOUNTER — Encounter (HOSPITAL_BASED_OUTPATIENT_CLINIC_OR_DEPARTMENT_OTHER): Payer: Self-pay | Admitting: Physical Therapy

## 2021-01-18 ENCOUNTER — Ambulatory Visit (HOSPITAL_BASED_OUTPATIENT_CLINIC_OR_DEPARTMENT_OTHER): Payer: Medicare PPO | Attending: Family Medicine | Admitting: Physical Therapy

## 2021-01-18 ENCOUNTER — Other Ambulatory Visit: Payer: Self-pay

## 2021-01-18 DIAGNOSIS — M6281 Muscle weakness (generalized): Secondary | ICD-10-CM | POA: Insufficient documentation

## 2021-01-18 DIAGNOSIS — R262 Difficulty in walking, not elsewhere classified: Secondary | ICD-10-CM | POA: Insufficient documentation

## 2021-01-18 DIAGNOSIS — M25551 Pain in right hip: Secondary | ICD-10-CM | POA: Diagnosis present

## 2021-01-18 NOTE — Therapy (Signed)
OUTPATIENT PHYSICAL THERAPY TREATMENT NOTE   Patient Name: Glenn Collins MRN: [id number] DOB:[birth year], 75 y.o., male Today's Date: 01/18/2021  PCP: Tommi Rumps [place], Buren Kos, MD REFERRING PROVIDER: de [place], Raymond J, MD   PT End of Session - 01/18/21 1009     Visit Number 6    Number of Visits 21    Date for PT Re-Evaluation 03/11/21    Authorization Type [employer] MCR    Progress Note Due on Visit 10    PT Start Time 1008    PT Stop Time 1048    PT Time Calculation (min) 40 min    Activity Tolerance Patient tolerated treatment well    Behavior During Therapy [facility] for tasks assessed/performed              Past Medical History:  Diagnosis Date   Arthritis    CAD (coronary artery disease) 10/27/2018   Coronary artery disease    Heart murmur Since childhood   Hypertension    PAD (peripheral artery disease) (HCC) 10/27/2018   Past Surgical History:  Procedure Laterality Date   CARDIAC SURGERY     HERNIA REPAIR  2012   PACEMAKER IMPLANT N/A 09/29/2019   Procedure: PACEMAKER IMPLANT;  Surgeon: Marinus Maw, MD;  Location: [facility];  Service: Cardiovascular;  Laterality: N/A;   Patient Active Problem List   Diagnosis Date Noted   Valvular heart disease 12/23/2020   Dyslipidemia 12/23/2020   Greater trochanteric pain syndrome of right lower extremity 12/23/2020   Stenosis of left subclavian artery (HCC) 04/02/2020   Aortic stenosis 04/02/2020   Pacemaker 01/05/2020   Complete heart block (HCC) 09/28/2019   Syncope 09/08/2019   Hyperlipidemia 04/22/2019   Bilateral carotid bruits 12/24/2018   PAD (peripheral artery disease) (HCC) 10/27/2018   Hypertension 10/27/2018   CAD (coronary artery disease) 10/27/2018   NSTEMI (non-ST elevated myocardial infarction) 10/27/2018   Acute back pain 10/27/2018    REFERRING DIAG: REFERRING DIAG: Greater Trochanteric pain syndrome of Rt lower extremity   THERAPY DIAG:  Pain in right hip - Plan: PT plan of care  cert/re-cert   Muscle weakness (generalized) - Plan: PT plan of care cert/re-cert   Difficulty in walking, not elsewhere classified - Plan: PT plan of care cert/re-cert   SUBJECTIVE:    SUBJECTIVE STATEMENT: not as achey as I was last time. Still wearing lift and feeling good. I felt better after mobs last time.    PERTINENT HISTORY: pacemaker   PAIN:  Are you having pain? Yes VAS scale: 2/10 Pain location: lateral Rt hip into groin Pain orientation: Right  PAIN TYPE: ache Pain description: intermittent  Aggravating factors: walking Relieving factors: pain patch and heat at rest  THERAPY DIAG:  Pain in right hip  Muscle weakness (generalized)  Difficulty in walking, not elsewhere classified   PRECAUTIONS: PRECAUTIONS: None    PATIENT GOALS decrease pain, walk freely- walk with wife in evenings, was using cane only occaisonally    OBJECTIVE: Rt hip flexion 90 deg, Lt 110- prior to manual, Rt incr to 100 following manual  TODAY'S TREATMENT:  10/4  Nustep 5 min L5 UE & LE  Manual: Rt foot on 4" step   Clam Rt side to fatigue  Rt SL hip abd, unable to lift leg but good acitvation  Bridge with ball bw knees- arms on table x10, arms over chest x10  Seated alternating march  Seated HSS  Standing static stance hold- NBOS, wide tandem   Sit  to stand with hip hinge-elevated table to reduce hip impingement  9/29  Attempted bike but too much pain in Lt thigh today  Manual: roller to Lt quads & lateral thigh, long axis RLE traction, hooklying Rt FA joint lateral distraction, hooklying IR with long axis post pressure through femur into post capsule  Seated adduction with ball  Nustep 5 min L5 UE & LE following manual therapy   Gait training- narrowing BOS  Rt SLS with Lt foot up on 2" step to activate hip abd group  9/27    Nu step 5 min L5 LE only  Neutral BOS & single foot fwd- green tband bil arm pull down & alt--all also done with 5lb weights lifting to <90deg  flexion  Gait training: in classroom- single bar for support and mirror, working to narrow BOS  2 layer heel lift in Lt shoe & gait training following  Manual: lateral FA distraction in hooklying, roller to Rt quads  Previous: Bridge 5s hold 2x10 Supine march 2x10 Hooklying hip ABD iso GTB 5s 10x Clamshell with 5s hold 10x Hooklying piriformis stretch 30s 2x STS mid thigh height 2x10 Standing weight shift R side 5s 10x Standing hip ABD 5x (stopped, too painful) STM: R gluteals and lateral hip rotators, deep ischemic pressure    Supine march 2x10 Hooklying hip ABD iso GTB 5s 10x Hooklying piriformis stretch 30s 3x Lateral weight shift 20x 5s AP weight shift 20x 5s 2x10 HR with cuing for glute contraction   STM: R gluteals and lateral hip rotators        HOME EXERCISE PROGRAM: Access Code: [email or web address] URL: [email or web address] Date: 01/07/2021 Prepared by: Zebedee Iba  Exercises Supine Piriformis Stretch with Foot on Ground - 1 x daily - 7 x weekly - 1 sets - 3 reps - 30 hold Clamshell with Resistance - 1 x daily - 7 x weekly - 2 sets - 10 reps - 5 hold Side to Side Weight Shift with Unilateral Counter Support - 1 x daily - 7 x weekly - 2 sets - 10 reps - 5 hold Heel Raise - 1 x daily - 7 x weekly - 2 sets - 15 reps Supine Bridge - 1 x daily - 7 x weekly - 2 sets - 10 reps - 5 hold   ASSESSMENT:   CLINICAL IMPRESSION:     able to increase hip flexion past 90 deg today with post hip mobs. Worked on adding hip flexion to sit to stand for hinge rather than twisting to stand. Did have to elevate bed due to impingement but pt tolerated very well. Will cont to progress.    REHAB POTENTIAL: Good   CLINICAL DECISION MAKING: Stable/uncomplicated   EVALUATION COMPLEXITY: Low     GOALS: Goals reviewed with patient? Yes   SHORT TERM GOALS:   STG Name Target Date Goal status  1 Pt will demo proper gait pattern with AD Baseline: I havent felt like I needed AD  9/30   achieved  2 Pt will be able to obtain a static stance at midline Baseline: able to demo 01/14/21 achieved  LONG TERM GOALS:    LTG Name Target Date Goal status  1 Hip gross strength to 4/5 Baseline:see flowsheet 03/11/21   INITIAL  2 Pt will be able to return to evening walks with wife Baseline:unable at eval 03/11/21 INITIAL  3 <=3/10 pain with daily ambulation Baseline:severe and limiting at eval 03/11/21   INITIAL  4 Pt will be independent with long  term strengthening HEP Baseline:will progress and establish as appropriate 03/11/21 INITIAL  5 Pt will feel comfortable navigating stairs in his home Baseline: has a 3-level home and just avoids the stairs for the most part 03/11/21 Initial                      PLAN: PT FREQUENCY: 2x/week   PT DURATION: 10 weeks   PLANNED INTERVENTIONS: Therapeutic exercises, Therapeutic activity, Neuro Muscular re-education, Balance training, Gait training, Patient/Family education, Joint mobilization, Stair training, Aquatic Therapy, Dry Needling, Cryotherapy, Moist heat, Taping, and Manual therapy   PLAN FOR NEXT SESSION:  cont post hip mobs- avoiding impingement, cont seated hip hinge   Ajahni Nay C. Jadden Yim PT, DPT 01/18/21 10:51 AM    [facility] [address] [place], [place], [address] Phone: [phone]   Fax:  [phone]  Patient name: Cassiel Fernandez MRN: [id number] DOB: [birth year]

## 2021-01-21 ENCOUNTER — Ambulatory Visit (HOSPITAL_BASED_OUTPATIENT_CLINIC_OR_DEPARTMENT_OTHER): Payer: Medicare PPO | Admitting: Physical Therapy

## 2021-01-21 ENCOUNTER — Encounter (HOSPITAL_BASED_OUTPATIENT_CLINIC_OR_DEPARTMENT_OTHER): Payer: Self-pay | Admitting: Physical Therapy

## 2021-01-21 ENCOUNTER — Other Ambulatory Visit: Payer: Self-pay

## 2021-01-21 DIAGNOSIS — M25551 Pain in right hip: Secondary | ICD-10-CM | POA: Diagnosis not present

## 2021-01-21 DIAGNOSIS — R262 Difficulty in walking, not elsewhere classified: Secondary | ICD-10-CM

## 2021-01-21 DIAGNOSIS — M6281 Muscle weakness (generalized): Secondary | ICD-10-CM

## 2021-01-21 NOTE — Therapy (Signed)
OUTPATIENT PHYSICAL THERAPY TREATMENT NOTE   Patient Name: Glenn Collins MRN: [id number] DOB:1945-05-24, 75 y.o., male Today's Date: 01/21/2021  PCP: Tommi Rumps [place], Buren Kos, MD REFERRING PROVIDER: de [place], Raymond J, MD   PT End of Session - 01/21/21 1019     Visit Number 7    Number of Visits 21    Date for PT Re-Evaluation 03/11/21    Authorization Type [employer] MCR    Progress Note Due on Visit 10    PT Start Time 1010    PT Stop Time 1055    PT Time Calculation (min) 45 min    Activity Tolerance Patient tolerated treatment well    Behavior During Therapy [facility] for tasks assessed/performed              Past Medical History:  Diagnosis Date   Arthritis    CAD (coronary artery disease) 10/27/2018   Coronary artery disease    Heart murmur Since childhood   Hypertension    PAD (peripheral artery disease) (HCC) 10/27/2018   Past Surgical History:  Procedure Laterality Date   CARDIAC SURGERY     HERNIA REPAIR  2012   PACEMAKER IMPLANT N/A 09/29/2019   Procedure: PACEMAKER IMPLANT;  Surgeon: Marinus Maw, MD;  [facility]: [facility];  Service: Cardiovascular;  Laterality: N/A;   Patient Active Problem List   Diagnosis Date Noted   Valvular heart disease 12/23/2020   Dyslipidemia 12/23/2020   Greater trochanteric pain syndrome of right lower extremity 12/23/2020   Stenosis of left subclavian artery (HCC) 04/02/2020   Aortic stenosis 04/02/2020   Pacemaker 01/05/2020   Complete heart block (HCC) 09/28/2019   Syncope 09/08/2019   Hyperlipidemia 04/22/2019   Bilateral carotid bruits 12/24/2018   PAD (peripheral artery disease) (HCC) 10/27/2018   Hypertension 10/27/2018   CAD (coronary artery disease) 10/27/2018   NSTEMI (non-ST elevated myocardial infarction) 10/27/2018   Acute back pain 10/27/2018    REFERRING DIAG: REFERRING DIAG: Greater Trochanteric pain syndrome of Rt lower extremity   THERAPY DIAG:  Pain in right hip - Plan: PT plan of care  cert/re-cert   Muscle weakness (generalized) - Plan: PT plan of care cert/re-cert   Difficulty in walking, not elsewhere classified - Plan: PT plan of care cert/re-cert   SUBJECTIVE:    SUBJECTIVE STATEMENT: groin area was a little sore with hip flexion exercise. I am wearing an elastic brace on my right knee.   PERTINENT HISTORY: pacemaker   PAIN:  Are you having pain? no VAS scale: 0/10 Pain location: lateral Rt hip into groin Pain orientation: Right  PAIN TYPE: ache Pain description: intermittent  Aggravating factors: walking Relieving factors: pain patch and heat at rest  THERAPY DIAG:  Pain in right hip  Muscle weakness (generalized)  Difficulty in walking, not elsewhere classified   PRECAUTIONS: PRECAUTIONS: None    PATIENT GOALS decrease pain, walk freely- walk with wife in evenings, was using cane only occaisonally    OBJECTIVE: Rt hip flexion 90 deg, Lt 110- prior to manual, Rt incr to 100 following manual  TODAY'S TREATMENT:  10/7  Manual: Rt hip LAD, lateral distraction, post hip mobs at end range flexion/adduction  Nu step 5 min L7 UE & LE Seated hip hinge with dowel  Seated hamstring stretch 2x each 20s  Hip width BOS- with bar- punch fwd, shoulder flexion to 90 trunk twists    10/4  Nustep 5 min L5 UE & LE  Manual: Rt foot on 4" step  Clam Rt side to fatigue  Rt SL hip abd, unable to lift leg but good acitvation  Bridge with ball bw knees- arms on table x10, arms over chest x10  Seated alternating march  Seated HSS  Standing static stance hold- NBOS, wide tandem   Sit to stand with hip hinge-elevated table to reduce hip impingement  9/29  Attempted bike but too much pain in Lt thigh today  Manual: roller to Lt quads & lateral thigh, long axis RLE traction, hooklying Rt FA joint lateral distraction, hooklying IR with long axis post pressure through femur into post capsule  Seated adduction with ball  Nustep 5 min L5 UE & LE following manual  therapy   Gait training- narrowing BOS  Rt SLS with Lt foot up on 2" step to activate hip abd group       HOME EXERCISE PROGRAM: Access Code: [email or web address] URL: [email or web address]    ASSESSMENT:   CLINICAL IMPRESSION:     altered hip flexion challenge to be more passive with movement through trunk rather than active hip flexion that was irritating hip flexors and creating impingement. Hip flexion improved to 8 deg past neutral.    REHAB POTENTIAL: Good   CLINICAL DECISION MAKING: Stable/uncomplicated   EVALUATION COMPLEXITY: Low     GOALS: Goals reviewed with patient? Yes   SHORT TERM GOALS:   STG Name Target Date Goal status  1 Pt will demo proper gait pattern with AD Baseline: I havent felt like I needed AD 9/30   achieved  2 Pt will be able to obtain a static stance at midline Baseline: able to demo 01/14/21 achieved  LONG TERM GOALS:    LTG Name Target Date Goal status  1 Hip gross strength to 4/5 Baseline:see flowsheet 03/11/21   INITIAL  2 Pt will be able to return to evening walks with wife Baseline:unable at eval 03/11/21 INITIAL  3 <=3/10 pain with daily ambulation Baseline:severe and limiting at eval 03/11/21   INITIAL  4 Pt will be independent with long term strengthening HEP Baseline:will progress and establish as appropriate 03/11/21 INITIAL  5 Pt will feel comfortable navigating stairs in his home Baseline: has a 3-level home and just avoids the stairs for the most part 03/11/21 Initial                      PLAN: PT FREQUENCY: 2x/week   PT DURATION: 10 weeks   PLANNED INTERVENTIONS: Therapeutic exercises, Therapeutic activity, Neuro Muscular re-education, Balance training, Gait training, Patient/Family education, Joint mobilization, Stair training, Aquatic Therapy, Dry Needling, Cryotherapy, Moist heat, Taping, and Manual therapy   PLAN FOR NEXT SESSION:  cont post hip mobs- avoiding impingement, cont seated hip hinge  Cairo Agostinelli C.  Cordai Rodrigue PT, DPT 01/21/21 10:57 AM     [facility] [address] [place], [place], [address] Phone: [phone]   Fax:  [phone]  Patient name: Glenn Collins MRN: [id number] DOB: [birth year]

## 2021-01-24 ENCOUNTER — Other Ambulatory Visit: Payer: Self-pay | Admitting: Cardiovascular Disease

## 2021-01-25 ENCOUNTER — Other Ambulatory Visit: Payer: Self-pay

## 2021-01-25 ENCOUNTER — Ambulatory Visit (HOSPITAL_BASED_OUTPATIENT_CLINIC_OR_DEPARTMENT_OTHER): Payer: Medicare PPO | Admitting: Physical Therapy

## 2021-01-25 ENCOUNTER — Encounter (HOSPITAL_BASED_OUTPATIENT_CLINIC_OR_DEPARTMENT_OTHER): Payer: Self-pay | Admitting: Physical Therapy

## 2021-01-25 DIAGNOSIS — M25551 Pain in right hip: Secondary | ICD-10-CM

## 2021-01-25 DIAGNOSIS — M6281 Muscle weakness (generalized): Secondary | ICD-10-CM

## 2021-01-25 DIAGNOSIS — R262 Difficulty in walking, not elsewhere classified: Secondary | ICD-10-CM

## 2021-01-25 NOTE — Therapy (Signed)
OUTPATIENT PHYSICAL THERAPY TREATMENT NOTE   Patient Name: Glenn Collins MRN: [id number] DOB:14-Mar-1946, 75 y.o., male Today's Date: 01/25/2021  PCP: Tommi Rumps [place], Buren Kos, MD REFERRING PROVIDER: de [place], Raymond J, MD   PT End of Session - 01/25/21 1106     Visit Number 8    Number of Visits 21    Date for PT Re-Evaluation 03/11/21    Authorization Type [employer] MCR    Progress Note Due on Visit 10    PT Start Time 1100    PT Stop Time 1140    PT Time Calculation (min) 40 min    Activity Tolerance Patient tolerated treatment well    Behavior During Therapy [facility] for tasks assessed/performed              Past Medical History:  Diagnosis Date   Arthritis    CAD (coronary artery disease) 10/27/2018   Coronary artery disease    Heart murmur Since childhood   Hypertension    PAD (peripheral artery disease) (HCC) 10/27/2018   Past Surgical History:  Procedure Laterality Date   CARDIAC SURGERY     HERNIA REPAIR  2012   PACEMAKER IMPLANT N/A 09/29/2019   Procedure: PACEMAKER IMPLANT;  Surgeon: Marinus Maw, MD;  [facility]: [facility];  Service: Cardiovascular;  Laterality: N/A;   Patient Active Problem List   Diagnosis Date Noted   Valvular heart disease 12/23/2020   Dyslipidemia 12/23/2020   Greater trochanteric pain syndrome of right lower extremity 12/23/2020   Stenosis of left subclavian artery (HCC) 04/02/2020   Aortic stenosis 04/02/2020   Pacemaker 01/05/2020   Complete heart block (HCC) 09/28/2019   Syncope 09/08/2019   Hyperlipidemia 04/22/2019   Bilateral carotid bruits 12/24/2018   PAD (peripheral artery disease) (HCC) 10/27/2018   Hypertension 10/27/2018   CAD (coronary artery disease) 10/27/2018   NSTEMI (non-ST elevated myocardial infarction) 10/27/2018   Acute back pain 10/27/2018    REFERRING DIAG: REFERRING DIAG: Greater Trochanteric pain syndrome of Rt lower extremity   THERAPY DIAG:  Pain in right hip - Plan: PT plan of care  cert/re-cert   Muscle weakness (generalized) - Plan: PT plan of care cert/re-cert   Difficulty in walking, not elsewhere classified - Plan: PT plan of care cert/re-cert   SUBJECTIVE:    SUBJECTIVE STATEMENT: Pt states he is feeling good today. He has continued L hip pain with each step.    PERTINENT HISTORY: pacemaker   PAIN:  Are you having pain? no VAS scale: 0/10 Pain location: lateral Rt hip into groin Pain orientation: Right  PAIN TYPE: ache Pain description: intermittent  Aggravating factors: walking Relieving factors: pain patch and heat at rest  THERAPY DIAG:  Pain in right hip  Muscle weakness (generalized)  Difficulty in walking, not elsewhere classified   PRECAUTIONS: PRECAUTIONS: None    PATIENT GOALS decrease pain, walk freely- walk with wife in evenings, was using cane only occaisonally    OBJECTIVE: PROM- start R hip 85 deg flexion, 110 at end of session   TODAY'S TREATMENT:   10/11  Manual: Mulligan inferolateral hip mobilization grade III with hip flexion emphasis; STM R hip rotators and gluteals, ischemic pressure  Bridge 2s hold 10x  SKTC 10s 10x   Nu step 5 min L7 UE & LE Seated hip hinge with dowel 15x Seated hip hinge with R foot elevated 5s 10x  Standing HS stretch foot on box 30s 2x (modified from seated due to increase in back pain)  10/7  Manual: Rt hip LAD, lateral distraction, post hip mobs at end range flexion/adduction  Nu step 5 min L7 UE & LE Seated hip hinge with dowel  Seated hamstring stretch 2x each 20s  Hip width BOS- with bar- punch fwd, shoulder flexion to 90 trunk twists    10/4  Nustep 5 min L5 UE & LE  Manual: Rt foot on 4" step   Clam Rt side to fatigue  Rt SL hip abd, unable to lift leg but good acitvation  Bridge with ball bw knees- arms on table x10, arms over chest x10  Seated alternating march  Seated HSS  Standing static stance hold- NBOS, wide tandem   Sit to stand with hip hinge-elevated  table to reduce hip impingement     HOME EXERCISE PROGRAM: Access Code: [email or web address] URL: [email or web address]    ASSESSMENT:   CLINICAL IMPRESSION:     Pt continues to have antalgic gait pattern with lateral L hip pain and compensated Trendelenburg. Pt responded well today to hip mobilization to improve hip flexion as well as STM to hip rotators/glutes. Pt report of reduction of pain with walking/stance. Pt HS stretch modified to prevent back pain at home. Technique review of  seated hip hinge as well. Pt able to continue with lateral hip rotator loading and favors isometric holds for analgesic effect. Pt would benefit from continued skilled therapy in order to reach goals and maximize functional R LE  strength and ROM for full return to PLOF.     REHAB POTENTIAL: Good   CLINICAL DECISION MAKING: Stable/uncomplicated   EVALUATION COMPLEXITY: Low     GOALS: Goals reviewed with patient? Yes   SHORT TERM GOALS:   STG Name Target Date Goal status  1 Pt will demo proper gait pattern with AD Baseline: I havent felt like I needed AD 9/30   achieved  2 Pt will be able to obtain a static stance at midline Baseline: able to demo 01/14/21 achieved  LONG TERM GOALS:    LTG Name Target Date Goal status  1 Hip gross strength to 4/5 Baseline:see flowsheet 03/11/21   INITIAL  2 Pt will be able to return to evening walks with wife Baseline:unable at eval 03/11/21 INITIAL  3 <=3/10 pain with daily ambulation Baseline:severe and limiting at eval 03/11/21   INITIAL  4 Pt will be independent with long term strengthening HEP Baseline:will progress and establish as appropriate 03/11/21 INITIAL  5 Pt will feel comfortable navigating stairs in his home Baseline: has a 3-level home and just avoids the stairs for the most part 03/11/21 Initial                      PLAN: PT FREQUENCY: 2x/week   PT DURATION: 10 weeks   PLANNED INTERVENTIONS: Therapeutic exercises, Therapeutic  activity, Neuro Muscular re-education, Balance training, Gait training, Patient/Family education, Joint mobilization, Stair training, Aquatic Therapy, Dry Needling, Cryotherapy, Moist heat, Taping, and Manual therapy   PLAN FOR NEXT SESSION:  continue with R lateral hip strength; hip mobs/STM PRN   Zebedee Iba PT, DPT 01/25/21 11:53 AM   Patient name: Glenn Collins MRN: [id number] DOB: [birth year]

## 2021-01-27 ENCOUNTER — Encounter (HOSPITAL_BASED_OUTPATIENT_CLINIC_OR_DEPARTMENT_OTHER): Payer: Self-pay | Admitting: Family Medicine

## 2021-01-27 ENCOUNTER — Other Ambulatory Visit: Payer: Self-pay

## 2021-01-27 ENCOUNTER — Ambulatory Visit (HOSPITAL_BASED_OUTPATIENT_CLINIC_OR_DEPARTMENT_OTHER): Payer: Medicare PPO | Admitting: Family Medicine

## 2021-01-27 VITALS — BP 124/68 | HR 81 | Ht 72.0 in | Wt 205.2 lb

## 2021-01-27 DIAGNOSIS — Z0000 Encounter for general adult medical examination without abnormal findings: Secondary | ICD-10-CM

## 2021-01-27 DIAGNOSIS — M25551 Pain in right hip: Secondary | ICD-10-CM | POA: Diagnosis not present

## 2021-01-27 NOTE — Progress Notes (Signed)
    Procedures performed today:    None.  Independent interpretation of notes and tests performed by another provider:   None.  Brief History, Exam, Impression, and Recommendations:    BP 124/68   Pulse 81   Ht 6' (1.829 m)   Wt 205 lb 3.2 oz (93.1 kg)   SpO2 99%   BMI 27.83 kg/m   Greater trochanteric pain syndrome of right lower extremity Has been working with physical therapy, feels that pain is mostly unchanged, however feels that he has improved function in regards to strength and range of motion Pain still primarily over outer hip, occasionally over anterior hip, extending down lateral right thigh On exam, tenderness palpation over greater trochanter, negative logroll, pain with FADIR, no pain with FABER.  Normal strength with hip abduction and adduction Given continued pain, will obtain hip x-rays with possible consideration of steroid injection in the future Recommend continue with conservative measures including physical therapy, home exercise program After review of x-rays and assessment of symptoms, determine if patient would like to proceed with steroid injection, likely GTB injection  Plan for follow-up in 4 to 6 weeks for CPE with labs to be completed about 1 week prior.  Labs to include CBC, CMP, A1c, lipid panel   ___________________________________________ Glenn Boney de [place], MD, ABFM, [facility] Primary Care and Sports Medicine [facility]

## 2021-01-27 NOTE — Assessment & Plan Note (Signed)
Has been working with physical therapy, feels that pain is mostly unchanged, however feels that he has improved function in regards to strength and range of motion Pain still primarily over outer hip, occasionally over anterior hip, extending down lateral right thigh On exam, tenderness palpation over greater trochanter, negative logroll, pain with FADIR, no pain with FABER.  Normal strength with hip abduction and adduction Given continued pain, will obtain hip x-rays with possible consideration of steroid injection in the future Recommend continue with conservative measures including physical therapy, home exercise program After review of x-rays and assessment of symptoms, determine if patient would like to proceed with steroid injection, likely GTB injection

## 2021-01-27 NOTE — Patient Instructions (Signed)
  Medication Instructions:  Your physician recommends that you continue on your current medications as directed. Please refer to the Current Medication list given to you today. --If you need a refill on any your medications before your next appointment, please call your pharmacy first. If no refills are authorized on file call the office.--  Referrals/Procedures/Imaging: Your physician recommends that you have an XRAY of your Right Hip. X-rays are a type of radiation called electromagnetic waves. X-ray imaging creates pictures of the inside of your body. The images show the parts of your body in different shades of black and white. This is because different tissues absorb different amounts of radiation. Calcium in bones absorbs x-rays the most, so bones look white. Fat and other soft tissues absorb less and look gray. Air absorbs the least, so lungs look black     1. You may have this done at the [facility], located in the [facility] Building on the 1st floor.    2. You do no have to have an appointment.    3. [address] [place], [place] [address]        [phone]        Monday - Friday  8:00 am - 5:00 pm 4. [facility]  [facility] at [facility]. [facility] at [facility] is a premier diagnostic imaging center that offers a 64-slice CT scanner and interventional radiology services.   Follow-Up: Your next appointment:   Your physician recommends that you schedule a follow-up appointment in: 4-6 WEEKS for CPE with Dr. de Peru  You will receive a text message or e-mail with a link to a survey about your care and experience with [place] today! We would greatly appreciate your feedback!   Thanks for letting us be apart of your health journey!!  Primary Care and Sports Medicine   Dr. Ceasar Mons Peru   We encourage you to activate your patient portal called "MyChart".  Sign up information is provided on this  After Visit Summary.  MyChart is used to connect with patients for Virtual Visits (Telemedicine).  Patients are able to view lab/test results, encounter notes, upcoming appointments, etc.  Non-urgent messages can be sent to your provider as well. To learn more about what you can do with MyChart, please visit --  [email or web address].

## 2021-01-28 ENCOUNTER — Encounter (HOSPITAL_BASED_OUTPATIENT_CLINIC_OR_DEPARTMENT_OTHER): Payer: Self-pay | Admitting: Physical Therapy

## 2021-01-28 ENCOUNTER — Ambulatory Visit (HOSPITAL_BASED_OUTPATIENT_CLINIC_OR_DEPARTMENT_OTHER): Payer: Medicare PPO | Admitting: Physical Therapy

## 2021-01-28 DIAGNOSIS — R262 Difficulty in walking, not elsewhere classified: Secondary | ICD-10-CM

## 2021-01-28 DIAGNOSIS — M25551 Pain in right hip: Secondary | ICD-10-CM | POA: Diagnosis not present

## 2021-01-28 DIAGNOSIS — M6281 Muscle weakness (generalized): Secondary | ICD-10-CM

## 2021-01-28 NOTE — Therapy (Signed)
OUTPATIENT PHYSICAL THERAPY TREATMENT NOTE   Patient Name: Glenn Collins MRN: [id number] DOB:12/25/1945, 75 y.o., male Today's Date: 01/28/2021  PCP: Tommi Rumps [place], Buren Kos, MD REFERRING PROVIDER: de [place], Raymond J, MD   PT End of Session - 01/28/21 1021     Visit Number 9    Number of Visits 21    Date for PT Re-Evaluation 03/11/21    Authorization Type [employer] MCR    Progress Note Due on Visit 10    PT Start Time 1015    PT Stop Time 1055    PT Time Calculation (min) 40 min    Activity Tolerance Patient tolerated treatment well    Behavior During Therapy [facility] for tasks assessed/performed              Past Medical History:  Diagnosis Date   Arthritis    CAD (coronary artery disease) 10/27/2018   Coronary artery disease    Heart murmur Since childhood   Hypertension    PAD (peripheral artery disease) (HCC) 10/27/2018   Past Surgical History:  Procedure Laterality Date   CARDIAC SURGERY     HERNIA REPAIR  2012   PACEMAKER IMPLANT N/A 09/29/2019   Procedure: PACEMAKER IMPLANT;  Surgeon: Marinus Maw, MD;  Location: [facility];  Service: Cardiovascular;  Laterality: N/A;   Patient Active Problem List   Diagnosis Date Noted   Valvular heart disease 12/23/2020   Dyslipidemia 12/23/2020   Greater trochanteric pain syndrome of right lower extremity 12/23/2020   Stenosis of left subclavian artery (HCC) 04/02/2020   Aortic stenosis 04/02/2020   Pacemaker 01/05/2020   Complete heart block (HCC) 09/28/2019   Syncope 09/08/2019   Hyperlipidemia 04/22/2019   Bilateral carotid bruits 12/24/2018   PAD (peripheral artery disease) (HCC) 10/27/2018   Hypertension 10/27/2018   CAD (coronary artery disease) 10/27/2018   NSTEMI (non-ST elevated myocardial infarction) 10/27/2018   Acute back pain 10/27/2018    REFERRING DIAG: REFERRING DIAG: Greater Trochanteric pain syndrome of Rt lower extremity   THERAPY DIAG:  Pain in right hip - Plan: PT plan of care  cert/re-cert   Muscle weakness (generalized) - Plan: PT plan of care cert/re-cert   Difficulty in walking, not elsewhere classified - Plan: PT plan of care cert/re-cert   SUBJECTIVE:    SUBJECTIVE STATEMENT: Had an appt with Dr Ihor Dow yesterday and we are going to go ahead with xray of hip. Stils hurting around joint and adding in pain down to leg.    PERTINENT HISTORY: pacemaker   PAIN:  Are you having pain? no VAS scale: 3/10 Pain location: lateral Rt hip into groin Pain orientation: Right  PAIN TYPE: ache Pain description: intermittent  Aggravating factors: walking Relieving factors: pain patch and heat at rest  THERAPY DIAG:  Pain in right hip  Muscle weakness (generalized)  Difficulty in walking, not elsewhere classified   PRECAUTIONS: PRECAUTIONS: None    PATIENT GOALS decrease pain, walk freely- walk with wife in evenings, was using cane only occaisonally    OBJECTIVE: PROM- start R hip 85 deg flexion, 110 at end of session   TODAY'S TREATMENT: 10/14  Nu step 6 min L5 Ue & LE  Manual: STM to post hip, roller to quads, HS and ITB; joint mobs: RLE LAD, lateral distraction in hooklying  Sidelying clam- large range  Seated LAQ with upright posture for core engagement  10/11  Manual: Mulligan inferolateral hip mobilization grade III with hip flexion emphasis; STM R hip rotators and  gluteals, ischemic pressure  Bridge 2s hold 10x  SKTC 10s 10x   Nu step 5 min L7 UE & LE Seated hip hinge with dowel 15x Seated hip hinge with R foot elevated 5s 10x  Standing HS stretch foot on box 30s 2x (modified from seated due to increase in back pain)     10/7  Manual: Rt hip LAD, lateral distraction, post hip mobs at end range flexion/adduction  Nu step 5 min L7 UE & LE Seated hip hinge with dowel  Seated hamstring stretch 2x each 20s  Hip width BOS- with bar- punch fwd, shoulder flexion to 90 trunk twists    10/4  Nustep 5 min L5 UE & LE  Manual: Rt foot on 4"  step   Clam Rt side to fatigue  Rt SL hip abd, unable to lift leg but good acitvation  Bridge with ball bw knees- arms on table x10, arms over chest x10  Seated alternating march  Seated HSS  Standing static stance hold- NBOS, wide tandem   Sit to stand with hip hinge-elevated table to reduce hip impingement     HOME EXERCISE PROGRAM: Access Code: [email or web address] URL: [email or web address]    ASSESSMENT:   CLINICAL IMPRESSION:     Significant tightness in hip musculature and dominance of hip flexors. Will continue to use manual therapy as it continues to be beneficial as well as normalizing strength levels and gait pattern.     REHAB POTENTIAL: Good   CLINICAL DECISION MAKING: Stable/uncomplicated   EVALUATION COMPLEXITY: Low     GOALS: Goals reviewed with patient? Yes   SHORT TERM GOALS:   STG Name Target Date Goal status  1 Pt will demo proper gait pattern with AD Baseline: I havent felt like I needed AD 9/30   achieved  2 Pt will be able to obtain a static stance at midline Baseline: able to demo 01/14/21 achieved  LONG TERM GOALS:    LTG Name Target Date Goal status  1 Hip gross strength to 4/5 Baseline:see flowsheet 03/11/21   INITIAL  2 Pt will be able to return to evening walks with wife Baseline:unable at eval 03/11/21 INITIAL  3 <=3/10 pain with daily ambulation Baseline:severe and limiting at eval 03/11/21   INITIAL  4 Pt will be independent with long term strengthening HEP Baseline:will progress and establish as appropriate 03/11/21 INITIAL  5 Pt will feel comfortable navigating stairs in his home Baseline: has a 3-level home and just avoids the stairs for the most part 03/11/21 Initial                      PLAN: PT FREQUENCY: 2x/week   PT DURATION: 10 weeks   PLANNED INTERVENTIONS: Therapeutic exercises, Therapeutic activity, Neuro Muscular re-education, Balance training, Gait training, Patient/Family education, Joint mobilization, Stair  training, Aquatic Therapy, Dry Needling, Cryotherapy, Moist heat, Taping, and Manual therapy   PLAN FOR NEXT SESSION:  continue with R lateral hip strength; hip mobs/STM PRN   Connor Meacham C. Mike Hamre PT, DPT 01/28/21 12:27 PM    Patient name: Glenn Collins MRN: [id number] DOB: 04-03-1946

## 2021-02-01 ENCOUNTER — Ambulatory Visit (HOSPITAL_BASED_OUTPATIENT_CLINIC_OR_DEPARTMENT_OTHER): Payer: Medicare PPO | Admitting: Physical Therapy

## 2021-02-01 ENCOUNTER — Encounter (HOSPITAL_BASED_OUTPATIENT_CLINIC_OR_DEPARTMENT_OTHER): Payer: Self-pay | Admitting: Physical Therapy

## 2021-02-01 ENCOUNTER — Other Ambulatory Visit: Payer: Self-pay

## 2021-02-01 DIAGNOSIS — R262 Difficulty in walking, not elsewhere classified: Secondary | ICD-10-CM

## 2021-02-01 DIAGNOSIS — M25551 Pain in right hip: Secondary | ICD-10-CM | POA: Diagnosis not present

## 2021-02-01 DIAGNOSIS — M6281 Muscle weakness (generalized): Secondary | ICD-10-CM

## 2021-02-01 NOTE — Therapy (Addendum)
OUTPATIENT PHYSICAL THERAPY TREATMENT NOTE Progress Note Reporting Period 12/29/20 to 02/01/21  See note below for Objective Data and Assessment of Progress/Goals.       Patient Name: Glenn Collins MRN: [id number] DOB:1945-05-31, 75 y.o., male Today's Date: 02/01/2021  PCP: Tommi Rumps [place], Buren Kos, MD REFERRING PROVIDER: de [place], Raymond J, MD   PT End of Session - 02/01/21 1102     Visit Number 10    Number of Visits 21    Date for PT Re-Evaluation 03/11/21    Authorization Type [employer] MCR    Progress Note Due on Visit 20    PT Start Time 1101    PT Stop Time 1140    PT Time Calculation (min) 39 min    Activity Tolerance Patient tolerated treatment well    Behavior During Therapy [facility] for tasks assessed/performed              Past Medical History:  Diagnosis Date   Arthritis    CAD (coronary artery disease) 10/27/2018   Coronary artery disease    Heart murmur Since childhood   Hypertension    PAD (peripheral artery disease) (HCC) 10/27/2018   Past Surgical History:  Procedure Laterality Date   CARDIAC SURGERY     HERNIA REPAIR  2012   PACEMAKER IMPLANT N/A 09/29/2019   Procedure: PACEMAKER IMPLANT;  Surgeon: Marinus Maw, MD;  Location: [facility];  Service: Cardiovascular;  Laterality: N/A;   Patient Active Problem List   Diagnosis Date Noted   Valvular heart disease 12/23/2020   Dyslipidemia 12/23/2020   Greater trochanteric pain syndrome of right lower extremity 12/23/2020   Stenosis of left subclavian artery (HCC) 04/02/2020   Aortic stenosis 04/02/2020   Pacemaker 01/05/2020   Complete heart block (HCC) 09/28/2019   Syncope 09/08/2019   Hyperlipidemia 04/22/2019   Bilateral carotid bruits 12/24/2018   PAD (peripheral artery disease) (HCC) 10/27/2018   Hypertension 10/27/2018   CAD (coronary artery disease) 10/27/2018   NSTEMI (non-ST elevated myocardial infarction) 10/27/2018   Acute back pain 10/27/2018    REFERRING DIAG: REFERRING  DIAG: Greater Trochanteric pain syndrome of Rt lower extremity   THERAPY DIAG:  Pain in right hip - Plan: PT plan of care cert/re-cert   Muscle weakness (generalized) - Plan: PT plan of care cert/re-cert   Difficulty in walking, not elsewhere classified - Plan: PT plan of care cert/re-cert   SUBJECTIVE:    SUBJECTIVE STATEMENT: Still uncomfortable when walking and some days are worse than others but today its not bad at all. When I stood up I got a sharp pain and it felt like my leg was going to cave in. Do not get them very often and I don't know when it is going to happen.   PERTINENT HISTORY: pacemaker   PAIN:  Are you having pain? yes VAS scale: 1/10 Pain location: lateral Rt hip into groin, into thigh Pain orientation: Right  PAIN TYPE: ache Pain description: intermittent  Aggravating factors: walking, random sharp pain when standing Relieving factors: pain patch and heat at rest  THERAPY DIAG:  Pain in right hip  Muscle weakness (generalized)  Difficulty in walking, not elsewhere classified   PRECAUTIONS: PRECAUTIONS: None    PATIENT GOALS decrease pain, walk freely- walk with wife in evenings, was using cane only occaisonally    OBJECTIVE: PROM- start R hip 105 deg flexion    Add  10 deg with mild discomfort, empty end feel      IR @  90 10 deg with stretchy end feel, no pain    Rt hip flexion 3/5 in seated, Lt 5/5   Rt hip abd 3/5  Gait: good heel toe pattern, wide BOS without circumduction, does not deviate Rt to Lt, lateral sidebend in trunk to ipsilateral stance leg, swinging arms contralateral to swing phase foot. No longer feels liked he needs SPC.   TODAY'S TREATMENT: 10/18  Objective evaluation and education for 10th visit PN  Nu step 5 min L7 UE & LE  Sit<>stand elevated table feet on aires  Lateral weight shift on airex  Manual: lateral distraction in hooklying, edu on self traction in standing and sidelying for home  10/14  Nu step 6 min L5 Ue &  LE  Manual: STM to post hip, roller to quads, HS and ITB; joint mobs: RLE LAD, lateral distraction in hooklying  Sidelying clam- large range  Seated LAQ with upright posture for core engagement  10/11  Manual: Mulligan inferolateral hip mobilization grade III with hip flexion emphasis; STM R hip rotators and gluteals, ischemic pressure  Bridge 2s hold 10x  SKTC 10s 10x   Nu step 5 min L7 UE & LE Seated hip hinge with dowel 15x Seated hip hinge with R foot elevated 5s 10x  Standing HS stretch foot on box 30s 2x (modified from seated due to increase in back pain)     10/7  Manual: Rt hip LAD, lateral distraction, post hip mobs at end range flexion/adduction  Nu step 5 min L7 UE & LE Seated hip hinge with dowel  Seated hamstring stretch 2x each 20s  Hip width BOS- with bar- punch fwd, shoulder flexion to 90 trunk twists    10/4  Nustep 5 min L5 UE & LE  Manual: Rt foot on 4" step   Clam Rt side to fatigue  Rt SL hip abd, unable to lift leg but good acitvation  Bridge with ball bw knees- arms on table x10, arms over chest x10  Seated alternating march  Seated HSS  Standing static stance hold- NBOS, wide tandem   Sit to stand with hip hinge-elevated table to reduce hip impingement     HOME EXERCISE PROGRAM: Access Code: [email or web address] URL: [email or web address]  PATIENT EDUCATION:  Education details: anatomy of condition & changes since eval, POC, HEP, exercise form/rationale Person educated: Patient Education method: Explanation, Demonstration, Tactile cues, Verbal cues Education comprehension: verbalized understanding, returned demonstration, verbal cues required, tactile cues required, and needs further education  ASSESSMENT:   CLINICAL IMPRESSION:    10 deg increase in adduction & IR, 15 deg increase in hip flexion passively since evaluation. Is now able to flex and abduct from hip against gravity. Gait pattern is improved and safer, no longer requiring SPC.  He plans to get his Xray on Friday to look at joint structures since he continue to have pain. Will continue with POC to further progress toward functional goals.      REHAB POTENTIAL: Good   CLINICAL DECISION MAKING: Stable/uncomplicated   EVALUATION COMPLEXITY: Low     GOALS: Goals reviewed with patient? Yes   SHORT TERM GOALS:   STG Name Target Date Goal status  1 Pt will demo proper gait pattern with AD Baseline: I havent felt like I needed AD 9/30   achieved  2 Pt will be able to obtain a static stance at midline Baseline: able to demo 01/14/21 achieved  LONG TERM GOALS:    LTG Name Target Date Goal status  1  Hip gross strength to 4/5 Baseline:see flowsheet 03/11/21   ongoing  2 Pt will be able to return to evening walks with wife Baseline:not doing walks right now 03/11/21 ongoing  3 <=3/10 pain with daily ambulation Baseline: about a 5/10 on average 03/11/21   ongoing  4 Pt will be independent with long term strengthening HEP Baseline:exercises are going well and make me feel better requires further progression 03/11/21 ongoing  5 Pt will feel comfortable navigating stairs in his home Baseline: I am doing the stairs a couple of times a day, I go up fine-its coming down that's hard 03/11/21 ongoing                      PLAN: PT FREQUENCY: 2x/week   PT DURATION: 10 weeks   PLANNED INTERVENTIONS: Therapeutic exercises, Therapeutic activity, Neuro Muscular re-education, Balance training, Gait training, Patient/Family education, Joint mobilization, Stair training, Aquatic Therapy, Dry Needling, Cryotherapy, Moist heat, Taping, and Manual therapy   PLAN FOR NEXT SESSION:  continue with R lateral hip strength; hip mobs/STM PRN Marelly Wehrman C. Labrandon Knoch PT, DPT 02/01/21 11:42 AM     Patient name: Glenn Collins MRN: [id number] DOB: 1945/09/04

## 2021-02-03 ENCOUNTER — Encounter (HOSPITAL_BASED_OUTPATIENT_CLINIC_OR_DEPARTMENT_OTHER): Payer: Self-pay | Admitting: Physical Therapy

## 2021-02-03 ENCOUNTER — Other Ambulatory Visit: Payer: Self-pay

## 2021-02-03 ENCOUNTER — Ambulatory Visit (HOSPITAL_BASED_OUTPATIENT_CLINIC_OR_DEPARTMENT_OTHER): Payer: Medicare PPO | Admitting: Physical Therapy

## 2021-02-03 DIAGNOSIS — M25551 Pain in right hip: Secondary | ICD-10-CM

## 2021-02-03 DIAGNOSIS — R262 Difficulty in walking, not elsewhere classified: Secondary | ICD-10-CM

## 2021-02-03 DIAGNOSIS — M6281 Muscle weakness (generalized): Secondary | ICD-10-CM

## 2021-02-03 NOTE — Therapy (Signed)
OUTPATIENT PHYSICAL THERAPY TREATMENT NOTE    Patient Name: Glenn Collins MRN: [id number] DOB:10/12/1945, 75 y.o., male Today's Date: 02/03/2021  PCP: Tommi Rumps [place], Buren Kos, MD REFERRING PROVIDER: de [place], Raymond J, MD   PT End of Session - 02/03/21 1059     Visit Number 11    Number of Visits 21    Date for PT Re-Evaluation 03/11/21    Authorization Type [employer] MCR    Progress Note Due on Visit 20    PT Start Time 1100    PT Stop Time 1138    PT Time Calculation (min) 38 min    Activity Tolerance Patient tolerated treatment well    Behavior During Therapy [facility] for tasks assessed/performed              Past Medical History:  Diagnosis Date   Arthritis    CAD (coronary artery disease) 10/27/2018   Coronary artery disease    Heart murmur Since childhood   Hypertension    PAD (peripheral artery disease) (HCC) 10/27/2018   Past Surgical History:  Procedure Laterality Date   CARDIAC SURGERY     HERNIA REPAIR  2012   PACEMAKER IMPLANT N/A 09/29/2019   Procedure: PACEMAKER IMPLANT;  Surgeon: Marinus Maw, MD;  Location: [facility];  Service: Cardiovascular;  Laterality: N/A;   Patient Active Problem List   Diagnosis Date Noted   Valvular heart disease 12/23/2020   Dyslipidemia 12/23/2020   Greater trochanteric pain syndrome of right lower extremity 12/23/2020   Stenosis of left subclavian artery (HCC) 04/02/2020   Aortic stenosis 04/02/2020   Pacemaker 01/05/2020   Complete heart block (HCC) 09/28/2019   Syncope 09/08/2019   Hyperlipidemia 04/22/2019   Bilateral carotid bruits 12/24/2018   PAD (peripheral artery disease) (HCC) 10/27/2018   Hypertension 10/27/2018   CAD (coronary artery disease) 10/27/2018   NSTEMI (non-ST elevated myocardial infarction) 10/27/2018   Acute back pain 10/27/2018    REFERRING DIAG: REFERRING DIAG: Greater Trochanteric pain syndrome of Rt lower extremity   THERAPY DIAG:  Pain in right hip - Plan: PT plan of care  cert/re-cert   Muscle weakness (generalized) - Plan: PT plan of care cert/re-cert   Difficulty in walking, not elsewhere classified - Plan: PT plan of care cert/re-cert   SUBJECTIVE:    SUBJECTIVE STATEMENT: Having a pretty good day today. No pain in hip today, its just always in my knees. I am doing things now that I was not before.   PERTINENT HISTORY: pacemaker   PAIN:  Are you having pain? no VAS scale: 0/10 Pain location: lateral Rt hip into groin, into thigh Pain orientation: Right  PAIN TYPE: ache Pain description: intermittent  Aggravating factors: walking, random sharp pain when standing Relieving factors: pain patch and heat at rest  THERAPY DIAG:  Pain in right hip  Muscle weakness (generalized)  Difficulty in walking, not elsewhere classified   PRECAUTIONS: PRECAUTIONS: None    PATIENT GOALS decrease pain, walk freely- walk with wife in evenings, was using cane only occaisonally    OBJECTIVE: PROM- start R hip 105 deg flexion    Add  10 deg with mild discomfort, empty end feel      IR @90  10 deg with stretchy end feel, no pain    Rt hip flexion 3/5 in seated, Lt 5/5   Rt hip abd 3/5  Gait: good heel toe pattern, wide BOS without circumduction, does not deviate Rt to Lt, lateral sidebend in trunk to ipsilateral  stance leg, swinging arms contralateral to swing phase foot. No longer feels liked he needs SPC.   TODAY'S TREATMENT: 10/20  Nu step 5 min L7 UE & LE  Clams: bil x20 reg, x20 rev, 90/90 abd  Supine piriformis stretch  Bridge with hip IR to touch knees- 5x5  Passive HS stretching in supine  Hip width stance- physioball lateral roll for trunk rotation, added flexion reach/roll  Standing distraction from edge of step  Shuttle: 2*25+6 double leg press, 1*25 Rt single leg press  10/18  Objective evaluation and education for 10th visit PN  Nu step 5 min L7 UE & LE  Sit<>stand elevated table feet on aires  Lateral weight shift on airex  Manual:  lateral distraction in hooklying, edu on self traction in standing and sidelying for home  10/14  Nu step 6 min L5 Ue & LE  Manual: STM to post hip, roller to quads, HS and ITB; joint mobs: RLE LAD, lateral distraction in hooklying  Sidelying clam- large range  Seated LAQ with upright posture for core engagement  10/11  Manual: Mulligan inferolateral hip mobilization grade III with hip flexion emphasis; STM R hip rotators and gluteals, ischemic pressure  Bridge 2s hold 10x  SKTC 10s 10x   Nu step 5 min L7 UE & LE Seated hip hinge with dowel 15x Seated hip hinge with R foot elevated 5s 10x  Standing HS stretch foot on box 30s 2x (modified from seated due to increase in back pain)     10/7  Manual: Rt hip LAD, lateral distraction, post hip mobs at end range flexion/adduction  Nu step 5 min L7 UE & LE Seated hip hinge with dowel  Seated hamstring stretch 2x each 20s  Hip width BOS- with bar- punch fwd, shoulder flexion to 90 trunk twists    10/4  Nustep 5 min L5 UE & LE  Manual: Rt foot on 4" step   Clam Rt side to fatigue  Rt SL hip abd, unable to lift leg but good acitvation  Bridge with ball bw knees- arms on table x10, arms over chest x10  Seated alternating march  Seated HSS  Standing static stance hold- NBOS, wide tandem   Sit to stand with hip hinge-elevated table to reduce hip impingement     HOME EXERCISE PROGRAM: Access Code: [email or web address] URL: [email or web address]  PATIENT EDUCATION:  Education details: exercise form/rationale Person educated: Patient Education method: Explanation, Demonstration, Tactile cues, Verbal cues Education comprehension: verbalized understanding, returned demonstration, verbal cues required, tactile cues required, and needs further education  ASSESSMENT:   CLINICAL IMPRESSION:    hip hinge in standing to encourage CKC use of ROM. Shooting into Rt thigh on a couple of occasions when paired with Rt rotation. 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    REHAB POTENTIAL: Good   CLINICAL DECISION MAKING: Stable/uncomplicated   EVALUATION COMPLEXITY: Low     GOALS: Goals reviewed with patient? Yes   SHORT TERM GOALS:   STG Name Target Date Goal status  1 Pt will demo proper gait pattern with AD Baseline: I havent felt like I needed AD 9/30   achieved  2 Pt will be able to obtain a static stance at midline Baseline: able to demo 01/14/21 achieved  LONG TERM GOALS:    LTG Name Target Date Goal status  1 Hip gross strength to 4/5 Baseline:see flowsheet 03/11/21   ongoing  2 Pt will be able to return to evening  walks with wife Baseline:not doing walks right now 03/11/21 ongoing  3 <=3/10 pain with daily ambulation Baseline: about a 5/10 on average 03/11/21   ongoing  4 Pt will be independent with long term strengthening HEP Baseline:exercises are going well and make me feel better requires further progression 03/11/21 ongoing  5 Pt will feel comfortable navigating stairs in his home Baseline: I am doing the stairs a couple of times a day, I go up fine-its coming down that's hard 03/11/21 ongoing                      PLAN: PT FREQUENCY: 2x/week   PT DURATION: 10 weeks   PLANNED INTERVENTIONS: Therapeutic exercises, Therapeutic activity, Neuro Muscular re-education, Balance training, Gait training, Patient/Family education, Joint mobilization, Stair training, Aquatic Therapy, Dry Needling, Cryotherapy, Moist heat, Taping, and Manual therapy   PLAN FOR NEXT SESSION:  continue with R lateral hip strength; hip mobs/STM PRN, prog shuttle   Tessah Patchen C. Markea Ruzich PT, DPT 02/03/21 11:40 AM     Patient name: Glenn Collins MRN: [id number] DOB: 19-Nov-1945

## 2021-02-04 ENCOUNTER — Ambulatory Visit
Admission: RE | Admit: 2021-02-04 | Discharge: 2021-02-04 | Disposition: A | Discharge status: Home / Self Care | Payer: Medicare PPO | Source: Ambulatory Visit | Attending: Family Medicine | Admitting: Family Medicine

## 2021-02-04 DIAGNOSIS — M25551 Pain in right hip: Secondary | ICD-10-CM

## 2021-02-06 NOTE — Therapy (Signed)
OUTPATIENT PHYSICAL THERAPY TREATMENT NOTE/Discharge    Patient Name: Glenn Collins MRN: [id number] DOB:1945-12-17, 75 y.o., male Today's Date: 02/07/2021  PCP: Tennis Must [place], Blondell Reveal, MD REFERRING PROVIDER: de [place], Raymond J, MD   PT End of Session - 02/07/21 1044     Visit Number 12    Number of Visits 21    Date for PT Re-Evaluation 03/11/21    Authorization Type [employer] MCR    Progress Note Due on Visit 85    PT Start Time 1856    PT Stop Time 1035    PT Time Calculation (min) 20 min    Activity Tolerance Patient tolerated treatment well    Behavior During Therapy [facility] for tasks assessed/performed               Past Medical History:  Diagnosis Date   Arthritis    CAD (coronary artery disease) 10/27/2018   Coronary artery disease    Heart murmur Since childhood   Hypertension    PAD (peripheral artery disease) ([place]) 10/27/2018   Past Surgical History:  Procedure Laterality Date   CARDIAC SURGERY     HERNIA REPAIR  2012   PACEMAKER IMPLANT N/A 09/29/2019   Procedure: PACEMAKER IMPLANT;  Surgeon: Evans Lance, MD;  Location: [facility];  Service: Cardiovascular;  Laterality: N/A;   Patient Active Problem List   Diagnosis Date Noted   Valvular heart disease 12/23/2020   Dyslipidemia 12/23/2020   Greater trochanteric pain syndrome of right lower extremity 12/23/2020   Stenosis of left subclavian artery (HCC) 04/02/2020   Aortic stenosis 04/02/2020   Pacemaker 01/05/2020   Complete heart block ([place]) 09/28/2019   Syncope 09/08/2019   Hyperlipidemia 04/22/2019   Bilateral carotid bruits 12/24/2018   PAD (peripheral artery disease) ([place]) 10/27/2018   Hypertension 10/27/2018   CAD (coronary artery disease) 10/27/2018   NSTEMI (non-ST elevated myocardial infarction) 10/27/2018   Acute back pain 10/27/2018    REFERRING DIAG: REFERRING DIAG: Greater Trochanteric pain syndrome of Rt lower extremity   THERAPY DIAG:  Pain in right hip - Plan: PT plan  of care cert/re-cert   Muscle weakness (generalized) - Plan: PT plan of care cert/re-cert   Difficulty in walking, not elsewhere classified - Plan: PT plan of care cert/re-cert   SUBJECTIVE:    SUBJECTIVE STATEMENT: right now pain is mild. Resolution of shooting pain into leg.   PERTINENT HISTORY: pacemaker   PAIN:  Are you having pain? yes VAS scale: 2/10 Pain location: lateral Rt hip into groin, into thigh Pain orientation: Right  PAIN TYPE: ache Pain description: intermittent  Aggravating factors: walking, random sharp pain when standing Relieving factors: pain patch and heat at rest  THERAPY DIAG:  Pain in right hip  Muscle weakness (generalized)  Difficulty in walking, not elsewhere classified   PRECAUTIONS: PRECAUTIONS: None    PATIENT GOALS decrease pain, walk freely- walk with wife in evenings, was using cane only occaisonally    OBJECTIVE: PROM- start R hip 105 deg flexion    Add  10 deg with mild discomfort, empty end feel      IR '@90'  10 deg with stretchy end feel, no pain    Rt hip flexion 3/5 in seated, Lt 5/5   Rt hip abd 3/5  Gait: good heel toe pattern, wide BOS without circumduction, does not deviate Rt to Lt, lateral sidebend in trunk to ipsilateral stance leg, swinging arms contralateral to swing phase foot. No longer feels liked he needs  SPC.   TODAY'S TREATMENT: 10/24  Verbal discussion of goals and exercise progression  10/20  Nu step 5 min L7 UE & LE  Clams: bil x20 reg, x20 rev, 90/90 abd  Supine piriformis stretch  Bridge with hip IR to touch knees- 5x5  Passive HS stretching in supine  Hip width stance- physioball lateral roll for trunk rotation, added flexion reach/roll  Standing distraction from edge of step  Shuttle: 2*25+6 double leg press, 1*25 Rt single leg press  10/18  Objective evaluation and education for 10th visit PN  Nu step 5 min L7 UE & LE  Sit<>stand elevated table feet on aires  Lateral weight shift on  airex  Manual: lateral distraction in hooklying, edu on self traction in standing and sidelying for home  10/14  Nu step 6 min L5 Ue & LE  Manual: STM to post hip, roller to quads, HS and ITB; joint mobs: RLE LAD, lateral distraction in hooklying  Sidelying clam- large range  Seated LAQ with upright posture for core engagement  10/11  Manual: Mulligan inferolateral hip mobilization grade III with hip flexion emphasis; STM R hip rotators and gluteals, ischemic pressure  Bridge 2s hold 10x  SKTC 10s 10x   Nu step 5 min L7 UE & LE Seated hip hinge with dowel 15x Seated hip hinge with R foot elevated 5s 10x  Standing HS stretch foot on box 30s 2x (modified from seated due to increase in back pain)     10/7  Manual: Rt hip LAD, lateral distraction, post hip mobs at end range flexion/adduction  Nu step 5 min L7 UE & LE Seated hip hinge with dowel  Seated hamstring stretch 2x each 20s  Hip width BOS- with bar- punch fwd, shoulder flexion to 90 trunk twists    10/4  Nustep 5 min L5 UE & LE  Manual: Rt foot on 4" step   Clam Rt side to fatigue  Rt SL hip abd, unable to lift leg but good acitvation  Bridge with ball bw knees- arms on table x10, arms over chest x10  Seated alternating march  Seated HSS  Standing static stance hold- NBOS, wide tandem   Sit to stand with hip hinge-elevated table to reduce hip impingement     HOME EXERCISE PROGRAM: Access Code: [email or web address] URL: [email or web address]  PATIENT EDUCATION:  Education details: see plan Person educated: Patient Education method: Explanation Education comprehension: verbalized understanding  ASSESSMENT:   CLINICAL IMPRESSION:     Pt has made significant progress since beginning PT but has reached a point where he is ready to continue with HEP independently. Encouraged him to continue using his mobility and challenging strength, flexibility and joint ROM in a pain-free manner. His Xray showed extensive  degenerative changes as we expected and we discussed the likelihood of THA which he is ready for as needed. Encouraged him to reach out to me with any further questions.     REHAB POTENTIAL: Good   CLINICAL DECISION MAKING: Stable/uncomplicated   EVALUATION COMPLEXITY: Low     GOALS: Goals reviewed with patient? Yes   SHORT TERM GOALS:   STG Name Target Date Goal status  1 Pt will demo proper gait pattern with AD Baseline: I havent felt like I needed AD 9/30   achieved  2 Pt will be able to obtain a static stance at midline Baseline: able to demo 01/14/21 achieved  LONG TERM GOALS:    LTG Name Target Date Goal status  1 Hip  gross strength to 4/5 Baseline:see flowsheet 03/11/21   ongoing  2 Pt will be able to return to evening walks with wife Baseline:it has been cold and not doing it, walk around house a lot 03/11/21 Partially met  3 <=3/10 pain with daily ambulation Baseline: steady 5/10 when walking 03/11/21   Not met  4 Pt will be independent with long term strengthening HEP Baseline:exercises are going well and make me feel better requires further progression 03/11/21 achieved  5 Pt will feel comfortable navigating stairs in his home Baseline: I am doing the stairs a couple of times a day, I go up fine-its coming down that's hard 03/11/21 achieved                      PLAN: PT FREQUENCY: 2x/week   PT DURATION: 10 weeks   PLANNED INTERVENTIONS: Therapeutic exercises, Therapeutic activity, Neuro Muscular re-education, Balance training, Gait training, Patient/Family education, Joint mobilization, Stair training, Aquatic Therapy, Dry Needling, Cryotherapy, Moist heat, Taping, and Manual therapy   PLAN FOR NEXT SESSION:  continue with R lateral hip strength; hip mobs/STM PRN, prog shuttle  Beckham Capistran C. Jakeel Starliper PT, DPT 02/07/21 10:56 AM   PHYSICAL THERAPY DISCHARGE SUMMARY  Visits from Start of Care: 11  Current functional level related to goals / functional  outcomes: See above   Remaining deficits: See above   Education / Equipment: Anatomy of condition, POC, HEP, exercise form/rationale    Patient agrees to discharge. Patient goals were partially met. Patient is being discharged due to the patient's request.   Patient name: Kaz Auld MRN: [id number] DOB: 1945-12-20

## 2021-02-07 ENCOUNTER — Other Ambulatory Visit: Payer: Self-pay

## 2021-02-07 ENCOUNTER — Encounter (HOSPITAL_BASED_OUTPATIENT_CLINIC_OR_DEPARTMENT_OTHER): Payer: Self-pay | Admitting: Physical Therapy

## 2021-02-07 ENCOUNTER — Encounter (HOSPITAL_BASED_OUTPATIENT_CLINIC_OR_DEPARTMENT_OTHER): Payer: Medicare PPO | Attending: Family Medicine | Admitting: Physical Therapy

## 2021-02-07 DIAGNOSIS — R262 Difficulty in walking, not elsewhere classified: Secondary | ICD-10-CM | POA: Insufficient documentation

## 2021-02-07 DIAGNOSIS — M6281 Muscle weakness (generalized): Secondary | ICD-10-CM | POA: Insufficient documentation

## 2021-02-07 DIAGNOSIS — M25551 Pain in right hip: Secondary | ICD-10-CM | POA: Insufficient documentation

## 2021-02-09 ENCOUNTER — Encounter (HOSPITAL_BASED_OUTPATIENT_CLINIC_OR_DEPARTMENT_OTHER): Payer: Medicare PPO | Admitting: Physical Therapy

## 2021-02-10 ENCOUNTER — Other Ambulatory Visit (HOSPITAL_BASED_OUTPATIENT_CLINIC_OR_DEPARTMENT_OTHER): Payer: Self-pay

## 2021-02-10 ENCOUNTER — Ambulatory Visit: Payer: Medicare PPO | Attending: Internal Medicine

## 2021-02-10 ENCOUNTER — Telehealth (HOSPITAL_BASED_OUTPATIENT_CLINIC_OR_DEPARTMENT_OTHER): Payer: Self-pay

## 2021-02-10 DIAGNOSIS — Z23 Encounter for immunization: Secondary | ICD-10-CM

## 2021-02-10 DIAGNOSIS — M25551 Pain in right hip: Secondary | ICD-10-CM

## 2021-02-10 MED ORDER — INFLUENZA VAC A&B SA ADJ QUAD 0.5 ML IM PRSY
PREFILLED_SYRINGE | INTRAMUSCULAR | 0 refills | Status: DC
  Filled 2021-02-10: qty 0.5, 1d supply, fill #0

## 2021-02-10 NOTE — Telephone Encounter (Signed)
Patient called and left a voicemail requesting for a referral to an orthopedic surgeon He stated that he felt based on his hip xray that he needs a hip replacement I noted that the xray has not yet been reported by Dr. de [place] Will send to Dr. de [place] for advisement

## 2021-02-10 NOTE — Progress Notes (Signed)
   Covid-19 Vaccination Clinic  Name:  Glenn Collins    MRN: [id number] DOB: June 27, 1945  02/10/2021  [name] was observed post Covid-19 immunization for 15 minutes without incident. He was provided with Vaccine Information Sheet and instruction to access the V-Safe system.   [name] was instructed to call 911 with any severe reactions post vaccine: Difficulty breathing  Swelling of face and throat  A fast heartbeat  A bad rash all over body  Dizziness and weakness   Immunizations Administered     Name Date Dose VIS Date Route   Moderna Covid-19 vaccine Bivalent Booster 02/10/2021  9:38 AM 0.5 mL 11/27/2020 Intramuscular   Manufacturer: Moderna   Lot: [id number]   NDC: 79150-569-79

## 2021-02-11 NOTE — Addendum Note (Signed)
Addended by: Dareen Piano on: 02/11/2021 09:18 AM   Modules accepted: Orders

## 2021-02-15 ENCOUNTER — Encounter (HOSPITAL_BASED_OUTPATIENT_CLINIC_OR_DEPARTMENT_OTHER): Payer: Medicare PPO | Admitting: Physical Therapy

## 2021-02-16 ENCOUNTER — Other Ambulatory Visit: Payer: Self-pay

## 2021-02-16 MED ORDER — METOPROLOL SUCCINATE ER 25 MG PO TB24: 25.0000 mg | ORAL_TABLET | Freq: Every day | ORAL | 1 refills | Status: DC

## 2021-02-18 ENCOUNTER — Encounter (HOSPITAL_BASED_OUTPATIENT_CLINIC_OR_DEPARTMENT_OTHER): Payer: Medicare PPO | Admitting: Physical Therapy

## 2021-02-22 ENCOUNTER — Encounter (HOSPITAL_BASED_OUTPATIENT_CLINIC_OR_DEPARTMENT_OTHER): Payer: Medicare PPO | Admitting: Physical Therapy

## 2021-02-24 ENCOUNTER — Encounter (HOSPITAL_BASED_OUTPATIENT_CLINIC_OR_DEPARTMENT_OTHER): Payer: Medicare PPO | Admitting: Physical Therapy

## 2021-02-28 DIAGNOSIS — I739 Peripheral vascular disease, unspecified: Secondary | ICD-10-CM | POA: Diagnosis not present

## 2021-02-28 DIAGNOSIS — M25551 Pain in right hip: Secondary | ICD-10-CM | POA: Diagnosis not present

## 2021-03-01 ENCOUNTER — Encounter (HOSPITAL_BASED_OUTPATIENT_CLINIC_OR_DEPARTMENT_OTHER): Payer: Medicare PPO | Admitting: Physical Therapy

## 2021-03-03 ENCOUNTER — Encounter (HOSPITAL_BASED_OUTPATIENT_CLINIC_OR_DEPARTMENT_OTHER): Payer: Medicare PPO | Admitting: Physical Therapy

## 2021-03-07 ENCOUNTER — Ambulatory Visit (HOSPITAL_BASED_OUTPATIENT_CLINIC_OR_DEPARTMENT_OTHER): Payer: Medicare PPO

## 2021-03-07 ENCOUNTER — Other Ambulatory Visit (HOSPITAL_BASED_OUTPATIENT_CLINIC_OR_DEPARTMENT_OTHER): Payer: Self-pay

## 2021-03-07 ENCOUNTER — Other Ambulatory Visit: Payer: Self-pay

## 2021-03-07 DIAGNOSIS — Z0000 Encounter for general adult medical examination without abnormal findings: Secondary | ICD-10-CM

## 2021-03-07 LAB — CBC WITH DIFFERENTIAL/PLATELET
Basophils Absolute: 0.1 10*3/uL (ref 0.0–0.2)
Basos: 1 %
EOS (ABSOLUTE): 0.1 10*3/uL (ref 0.0–0.4)
Eos: 2 %
Hematocrit: 38 % (ref 37.5–51.0)
Hemoglobin: 12.7 g/dL — ABNORMAL LOW (ref 13.0–17.7)
Immature Grans (Abs): 0 10*3/uL (ref 0.0–0.1)
Immature Granulocytes: 0 %
Lymphocytes Absolute: 1 10*3/uL (ref 0.7–3.1)
Lymphs: 16 %
MCH: 31.2 pg (ref 26.6–33.0)
MCHC: 33.4 g/dL (ref 31.5–35.7)
MCV: 93 fL (ref 79–97)
Monocytes Absolute: 0.9 10*3/uL (ref 0.1–0.9)
Monocytes: 14 %
Neutrophils Absolute: 4.2 10*3/uL (ref 1.4–7.0)
Neutrophils: 67 %
Platelets: 231 10*3/uL (ref 150–450)
RBC: 4.07 x10E6/uL — ABNORMAL LOW (ref 4.14–5.80)
RDW: 12.6 % (ref 11.6–15.4)
WBC: 6.3 10*3/uL (ref 3.4–10.8)

## 2021-03-07 LAB — LIPID PANEL
Chol/HDL Ratio: 2.4 ratio (ref 0.0–5.0)
Cholesterol, Total: 161 mg/dL (ref 100–199)
HDL: 67 mg/dL (ref >39)
LDL Chol Calc (NIH): 82 mg/dL (ref 0–99)
Triglycerides: 59 mg/dL (ref 0–149)
VLDL Cholesterol Cal: 12 mg/dL (ref 5–40)

## 2021-03-07 LAB — COMPREHENSIVE METABOLIC PANEL
ALT: 14 IU/L (ref 0–44)
AST: 21 IU/L (ref 0–40)
Albumin/Globulin Ratio: 2.3 — ABNORMAL HIGH (ref 1.2–2.2)
Albumin: 4.8 g/dL — ABNORMAL HIGH (ref 3.7–4.7)
Alkaline Phosphatase: 102 IU/L (ref 44–121)
BUN/Creatinine Ratio: 15 (ref 10–24)
BUN: 16 mg/dL (ref 8–27)
Bilirubin Total: 0.5 mg/dL (ref 0.0–1.2)
CO2: 23 mmol/L (ref 20–29)
Calcium: 9.6 mg/dL (ref 8.6–10.2)
Chloride: 96 mmol/L (ref 96–106)
Creatinine, Ser: 1.06 mg/dL (ref 0.76–1.27)
Globulin, Total: 2.1 g/dL (ref 1.5–4.5)
Glucose: 145 mg/dL — ABNORMAL HIGH (ref 70–99)
Potassium: 4.9 mmol/L (ref 3.5–5.2)
Sodium: 135 mmol/L (ref 134–144)
Total Protein: 6.9 g/dL (ref 6.0–8.5)
eGFR: 73 mL/min/{1.73_m2} (ref >59)

## 2021-03-07 LAB — HEMOGLOBIN A1C
Est. average glucose Bld gHb Est-mCnc: 111 mg/dL
Hgb A1c MFr Bld: 5.5 % (ref 4.8–5.6)

## 2021-03-07 MED ORDER — MODERNA COVID-19 BIVAL BOOSTER 50 MCG/0.5ML IM SUSP
INTRAMUSCULAR | 0 refills | Status: DC
  Filled 2021-03-07: qty 0.5, 1d supply, fill #0

## 2021-03-08 ENCOUNTER — Encounter (HOSPITAL_BASED_OUTPATIENT_CLINIC_OR_DEPARTMENT_OTHER): Payer: Medicare PPO | Admitting: Physical Therapy

## 2021-03-14 ENCOUNTER — Other Ambulatory Visit: Payer: Self-pay

## 2021-03-14 ENCOUNTER — Encounter (HOSPITAL_BASED_OUTPATIENT_CLINIC_OR_DEPARTMENT_OTHER): Payer: Self-pay | Admitting: Family Medicine

## 2021-03-14 ENCOUNTER — Ambulatory Visit (INDEPENDENT_AMBULATORY_CARE_PROVIDER_SITE_OTHER): Payer: Medicare PPO | Admitting: Family Medicine

## 2021-03-14 VITALS — BP 106/68 | HR 72 | Ht 71.0 in | Wt 203.1 lb

## 2021-03-14 DIAGNOSIS — Z0000 Encounter for general adult medical examination without abnormal findings: Secondary | ICD-10-CM | POA: Diagnosis not present

## 2021-03-14 DIAGNOSIS — Z23 Encounter for immunization: Secondary | ICD-10-CM

## 2021-03-14 DIAGNOSIS — I739 Peripheral vascular disease, unspecified: Secondary | ICD-10-CM

## 2021-03-14 DIAGNOSIS — Z1211 Encounter for screening for malignant neoplasm of colon: Secondary | ICD-10-CM

## 2021-03-14 MED ORDER — SHINGRIX 50 MCG/0.5ML IM SUSR: 0.5000 mL | Freq: Once | INTRAMUSCULAR | 0 refills | Status: AC

## 2021-03-14 MED ORDER — SHINGRIX 50 MCG/0.5ML IM SUSR: 0.5000 mL | Freq: Once | INTRAMUSCULAR | 0 refills | Status: DC

## 2021-03-14 NOTE — Assessment & Plan Note (Addendum)
The patient was counseled, risk factors were discussed, anticipatory guidance given. Discussed health maintenance recommendations Will proceed with pneumococcal vaccination today Prescription for Shingrix sent to preferred pharmacy Order placed for Cologuard, will be delivered to patient's home He is up-to-date on tetanus as well as influenza vaccination Discussed general lifestyle recommendations Patient does reportedly drink 28 glasses of wine per week, recommend gradual reduction in this and limiting to about 1 glass of wine daily Recommend regular dental and vision screening

## 2021-03-14 NOTE — Patient Instructions (Signed)
  Medication Instructions:  Your physician recommends that you continue on your current medications as directed. Please refer to the Current Medication list given to you today. --If you need a refill on any your medications before your next appointment, please call your pharmacy first. If no refills are authorized on file call the office.-- Referrals/Procedures/Imaging: Your physician recommends that you have a Cologuard screening for Colon Cancer. Cologuard is intended to screen adults 75 years of age and older who are at average risk for colorectal cancer by detecting certain DNA markers and blood in the stool. Do not use if you have had adenomas, have inflammatory bowel disease and certain hereditary syndromes, or a personal or family history of colorectal cancer. Cologuard is not a replacement for colonoscopy in high risk patients. Cologuard performance in adults ages 75-49 is estimated based on a large clinical study of patients 75 and older.   Cologuard is shipped directly to patients' homes upon ordering. Accompanied by 2 months of personalized outreach to facilitate screening completion, including a welcome call as well as mail, email, or text message reminders.   Cologuard can be completed in the comfort of home and does not require special preparation, dietary or medication changes, sedation, or time off from work. Patients should plan to collect their sample when they can get it back to [employer] that same day or the next day.   The [facility] is available 24/7 to help patients complete their Cologuard collection kit at [phone].  Patients can choose the no-cost return option that works best for them. They may:  Request a contact-free [employer] pickup: Call [phone] for help or visit [email or web address] to schedule it on their own.   Drop it off at [employer]: Visit [email or web address]/UPS to see local options and hours. Remember, some locations are closed on Sundays or holidays.    Follow-Up: Your next appointment:   Your physician recommends that you schedule a follow-up appointment in: 3 MONTHS with Dr. de [place]  You will receive a text message or e-mail with a link to a survey about your care and experience with [place] today! We would greatly appreciate your feedback!   Thanks for letting us be apart of your health journey!!  Primary Care and Sports Medicine   Dr. Arlina Robes [place]   We encourage you to activate your patient portal called "MyChart".  Sign up information is provided on this After Visit Summary.  MyChart is used to connect with patients for Virtual Visits (Telemedicine).  Patients are able to view lab/test results, encounter notes, upcoming appointments, etc.  Non-urgent messages can be sent to your provider as well. To learn more about what you can do with MyChart, please visit --  [email or web address].

## 2021-03-14 NOTE — Progress Notes (Signed)
Subjective:    CC: Annual Physical Exam  HPI:  Glenn Collins is a 75 y.o. presenting for annual physical  I reviewed the past medical history, family history, social history, surgical history, and allergies today and no changes were needed.  Please see the problem list section below in epic for further details.  Past Medical History: Past Medical History:  Diagnosis Date   Arthritis    CAD (coronary artery disease) 10/27/2018   Coronary artery disease    Heart murmur Since childhood   Hypertension    PAD (peripheral artery disease) (HCC) 10/27/2018   Past Surgical History: Past Surgical History:  Procedure Laterality Date   CARDIAC SURGERY     HERNIA REPAIR  2012   PACEMAKER IMPLANT N/A 09/29/2019   Procedure: PACEMAKER IMPLANT;  Surgeon: Marinus Maw, MD;  Location: [facility];  Service: Cardiovascular;  Laterality: N/A;   Social History: Social History   Socioeconomic History   Marital status: Married    Spouse name: Not on file   Number of children: Not on file   Years of education: Not on file   Highest education level: Not on file  Occupational History   Not on file  Tobacco Use   Smoking status: Former    Packs/day: 1.50    Years: 40.00    Pack years: 60.00    Types: Cigarettes    Quit date: 10/15/2000    Years since quitting: 20.4   Smokeless tobacco: Never  Vaping Use   Vaping Use: Never used  Substance and Sexual Activity   Alcohol use: Yes    Alcohol/week: 20.0 standard drinks    Types: 20 Glasses of wine per week   Drug use: Never   Sexual activity: Not Currently    Birth control/protection: Abstinence  Other Topics Concern   Not on file  Social History Narrative   Not on file   Social Determinants of Health   Financial Resource Strain: Not on file  Food Insecurity: Not on file  Transportation Needs: Not on file  Physical Activity: Not on file  Stress: Not on file  Social Connections: Not on file   Family History: Family  History  Problem Relation Age of Onset   Cancer Mother    Hearing loss Mother    CVA Father    Allergies: No Known Allergies Medications: See med rec.  Review of Systems: No headache, visual changes, nausea, vomiting, diarrhea, constipation, dizziness, abdominal pain, skin rash, fevers, chills, night sweats, swollen lymph nodes, weight loss, chest pain, body aches, joint swelling, muscle aches, shortness of breath, mood changes, visual or auditory hallucinations.  Objective:    BP 106/68   Pulse 72   Ht 5\' 11"  (1.803 m)   Wt 203 lb 1.6 oz (92.1 kg)   BMI 28.33 kg/m   General: Well Developed, well nourished, and in no acute distress.  Neuro: Alert and oriented x3, extra-ocular muscles intact, sensation grossly intact. Cranial nerves II through XII are intact, motor, sensory, and coordinative functions are all intact. HEENT: Normocephalic, atraumatic, pupils equal round reactive to light, neck supple, no masses, no lymphadenopathy, thyroid nonpalpable. Oropharynx, nasopharynx, external ear canals are unremarkable. Skin: Warm and dry, no rashes noted.  Cardiac: Regular rate and rhythm, systolic murmur present.  Respiratory: Clear to auscultation bilaterally. Not using accessory muscles, speaking in full sentences.  Abdominal: Soft, nontender, nondistended, positive bowel sounds, no masses, no organomegaly.  Impression and Recommendations:    Wellness examination The patient was  counseled, risk factors were discussed, anticipatory guidance given. Discussed health maintenance recommendations Will proceed with pneumococcal vaccination today Prescription for Shingrix sent to preferred pharmacy Order placed for Cologuard, will be delivered to patient's home He is up-to-date on tetanus as well as influenza vaccination Discussed general lifestyle recommendations Patient does reportedly drink 28 glasses of wine per week, recommend gradual reduction in this and limiting to about 1 glass of  wine daily Recommend regular dental and vision screening    ___________________________________________ Glenn Corredor de [place], MD, ABFM, CAQSM Primary Care and Sports Medicine [facility]

## 2021-03-16 ENCOUNTER — Ambulatory Visit (HOSPITAL_COMMUNITY)
Admission: RE | Admit: 2021-03-16 | Discharge: 2021-03-16 | Disposition: A | Discharge status: Home / Self Care | Payer: Medicare PPO | Source: Ambulatory Visit | Attending: Vascular Surgery | Admitting: Vascular Surgery

## 2021-03-16 ENCOUNTER — Encounter: Payer: Self-pay | Admitting: Vascular Surgery

## 2021-03-16 ENCOUNTER — Ambulatory Visit: Payer: Medicare PPO | Admitting: Vascular Surgery

## 2021-03-16 ENCOUNTER — Other Ambulatory Visit: Payer: Self-pay

## 2021-03-16 VITALS — BP 119/69 | HR 87 | Temp 98.7°F | Resp 20 | Ht 71.0 in | Wt 204.0 lb

## 2021-03-16 DIAGNOSIS — I739 Peripheral vascular disease, unspecified: Secondary | ICD-10-CM | POA: Diagnosis not present

## 2021-03-16 NOTE — Progress Notes (Signed)
Patient ID: Glenn Collins, male   DOB: [birth year], 75 y.o.   MRN: [id number]  Reason for Consult: New Patient (Initial Visit)   Referred by Samson Frederic, MD  Subjective:     HPI:  Glenn Collins is a 75 y.o. male originally from [place] now presents with right lower extremity pain.  He says that his pain starts in his hip and radiates down.  In the past he did have cramping of his bilateral calfs right greater than left and he continues to exercise on the bike and does have cramping occasionally.  He does not have any tissue loss or ulceration.  He has a history of coronary artery disease with stenting in [facility] [place].  He recently moved here for retirement with his wife who is originally from here.  He denies any history of stroke, TIA or amaurosis and his carotid arteries are followed by Dr. Allyson Sabal.  He does not have any specific complaints related to today's visit.  He does take aspirin and statin.  Previously he is taking Plavix.  Past Medical History:  Diagnosis Date   Arthritis    CAD (coronary artery disease) 10/27/2018   Coronary artery disease    Heart murmur Since childhood   Hypertension    PAD (peripheral artery disease) (HCC) 10/27/2018   Family History  Problem Relation Age of Onset   Cancer Mother    Hearing loss Mother    CVA Father    Past Surgical History:  Procedure Laterality Date   CARDIAC SURGERY     HERNIA REPAIR  2012   PACEMAKER IMPLANT N/A 09/29/2019   Procedure: PACEMAKER IMPLANT;  Surgeon: Marinus Maw, MD;  Location: [facility];  Service: Cardiovascular;  Laterality: N/A;    Short Social History:  Social History   Tobacco Use   Smoking status: Former    Packs/day: 1.50    Years: 40.00    Pack years: 60.00    Types: Cigarettes    Quit date: 10/15/2000    Years since quitting: 20.4   Smokeless tobacco: Never  Substance Use Topics   Alcohol use: Yes    Alcohol/week: 20.0 standard drinks    Types: 20 Glasses of  wine per week    No Known Allergies  Current Outpatient Medications  Medication Sig Dispense Refill   aspirin EC 81 MG tablet Take 81 mg by mouth daily.     atorvastatin (LIPITOR) 80 MG tablet TAKE ONE TABLET BY MOUTH DAILY AT 6PM 90 tablet 1   diphenhydrAMINE (BENADRYL) 25 MG tablet Take 25 mg by mouth at bedtime.     ibuprofen (ADVIL) 200 MG tablet Take 200 mg by mouth daily as needed for moderate pain.     loratadine (CLARITIN) 10 MG tablet Take 10 mg by mouth daily as needed for allergies.      metoprolol succinate (TOPROL XL) 25 MG 24 hr tablet Take 1 tablet (25 mg total) by mouth daily. 30 tablet 1   Multiple Vitamin (MULTIVITAMIN WITH MINERALS) TABS tablet Take 1 tablet by mouth daily.     nitroGLYCERIN (NITROSTAT) 0.4 MG SL tablet Place 1 tablet (0.4 mg total) under the tongue every 5 (five) minutes x 3 doses as needed for chest pain. 25 tablet 1   ramipril (ALTACE) 10 MG capsule Take 1 capsule (10 mg total) by mouth daily. Patient needs appointment for future refills. 90 capsule 0   sodium chloride (OCEAN) 0.65 % SOLN nasal spray Place 1 spray into both  nostrils as needed for congestion.     No current facility-administered medications for this visit.    Review of Systems  Constitutional:  Constitutional negative. HENT: HENT negative.  Eyes: Eyes negative.  Respiratory: Respiratory negative.  Cardiovascular: Cardiovascular negative.  GI: Gastrointestinal negative.  Musculoskeletal: Positive for leg pain and joint pain.  Neurological: Neurological negative. Hematologic: Hematologic/lymphatic negative.  Psychiatric: Psychiatric negative.       Objective:  Objective   Vitals:   03/16/21 0829  BP: 119/69  Pulse: 87  Resp: 20  Temp: 98.7 F (37.1 C)  SpO2: 96%  Weight: 204 lb (92.5 kg)  Height: 5\' 11"  (1.803 m)   Body mass index is 28.45 kg/m.  Physical Exam HENT:     Head: Normocephalic.     Nose:     Comments: Wearing a mask Eyes:     Pupils: Pupils are  equal, round, and reactive to light.  Neck:     Vascular: No carotid bruit.  Cardiovascular:     Pulses:          Radial pulses are 2+ on the right side and 2+ on the left side.       Femoral pulses are 1+ on the right side and 0 on the left side.      Popliteal pulses are 0 on the right side and 0 on the left side.     Heart sounds: Murmur heard.  Pulmonary:     Effort: Pulmonary effort is normal.     Breath sounds: Normal breath sounds.  Abdominal:     General: Abdomen is flat.     Palpations: Abdomen is soft. There is no mass.  Musculoskeletal:     Cervical back: Neck supple.  Skin:    General: Skin is warm and dry.     Capillary Refill: Capillary refill takes less than 2 seconds.  Neurological:     General: No focal deficit present.     Mental Status: He is alert.  Psychiatric:        Mood and Affect: Mood normal.        Behavior: Behavior normal.        Judgment: Judgment normal.    Data: ABI Findings:  +---------+------------------+-----+----------+----------------+  Right    Rt Pressure (mmHg)IndexWaveform  Comment           +---------+------------------+-----+----------+----------------+  Brachial 118                                                +---------+------------------+-----+----------+----------------+  PTA                             monophasicnon-compressible  +---------+------------------+-----+----------+----------------+  DP       109               0.88 monophasic                  +---------+------------------+-----+----------+----------------+  Great Toe82                0.66                             +---------+------------------+-----+----------+----------------+   +---------+------------------+-----+----------+----------------+  Left     Lt Pressure (mmHg)IndexWaveform  Comment           +---------+------------------+-----+----------+----------------+  Brachial 124                                                 +---------+------------------+-----+----------+----------------+  PTA                             monophasicnon-compressible  +---------+------------------+-----+----------+----------------+  DP                              monophasicnon-compressible  +---------+------------------+-----+----------+----------------+  Great Toe80                0.65                             +---------+------------------+-----+----------+----------------+   +-------+-----------+-----------+------------+------------+  ABI/TBIToday's ABIToday's TBIPrevious ABIPrevious TBI  +-------+-----------+-----------+------------+------------+  Right  [place]         0.66       0.68        0.50          +-------+-----------+-----------+------------+------------+  Left   [place]         0.65       [place]          0.61          +-------+-----------+-----------+------------+------------+           Summary:  Right: Resting right ankle-brachial index indicates noncompressible right  lower extremity arteries. The right toe-brachial index is abnormal.   Left: Resting left ankle-brachial index indicates noncompressible left  lower extremity arteries. The left toe-brachial index is abnormal.      Assessment/Plan:     75 year old male former smoker with bilateral monophasic noncompressible ABIs right lower extremity pain that appears to be related to his hip.  He does have a palpable femoral pulse on the right and from that standpoint I think could tolerate hip replacement surgery.  He will continue aspirin and statin I will see him in 1 year with repeat ABIs.     Maeola Harman MD [facility]

## 2021-03-29 ENCOUNTER — Ambulatory Visit (INDEPENDENT_AMBULATORY_CARE_PROVIDER_SITE_OTHER): Payer: Medicare PPO

## 2021-03-29 ENCOUNTER — Other Ambulatory Visit: Payer: Self-pay

## 2021-03-29 DIAGNOSIS — I442 Atrioventricular block, complete: Secondary | ICD-10-CM

## 2021-03-29 DIAGNOSIS — M1611 Unilateral primary osteoarthritis, right hip: Secondary | ICD-10-CM | POA: Diagnosis not present

## 2021-03-29 LAB — CUP PACEART REMOTE DEVICE CHECK
Date Time Interrogation Session: 20221213150714
Implantable Lead Implant Date: 20210614
Implantable Lead Implant Date: 20210614
Implantable Lead Location: 753859
Implantable Lead Location: 753860
Implantable Lead Model: 377171
Implantable Lead Model: 377171
Implantable Lead Serial Number: 7000244971
Implantable Lead Serial Number: 7000271886
Implantable Pulse Generator Implant Date: 20210614
Pulse Gen Model: 407145
Pulse Gen Serial Number: 69870190

## 2021-03-29 MED ORDER — ATORVASTATIN CALCIUM 80 MG PO TABS: ORAL_TABLET | ORAL | 0 refills | Status: DC

## 2021-03-30 ENCOUNTER — Telehealth: Payer: Self-pay | Admitting: *Deleted

## 2021-03-30 NOTE — Telephone Encounter (Signed)
° °  Name: Glenn Collins  DOB: [birth year]  MRN: [id number]  Primary Cardiologist: Nanetta Batty, MD  Chart reviewed as part of pre-operative protocol coverage. Because of [name]'s past medical history and time since last visit, he will require a follow-up visit in order to better assess preoperative cardiovascular risk. Last OV with Dr. Allyson Sabal was 03/2020. He is also in need of overdue for EP follow-up as well with last appt with Dr. Ladona Ridgel in 12/2019. But for purpose of this clearance needs the general cardiology f/u.   Pre-op covering staff: - Please schedule appointment and call patient to inform them.  - Please contact requesting surgeon's office via preferred method (i.e, phone, fax) to inform them of need for appointment prior to surgery.  Will hold off on routing antiplatelet q to MD since not indicated on original clearance that this needs to be held.  Laurann Montana, PA-C  03/30/2021, 5:14 PM

## 2021-03-30 NOTE — Telephone Encounter (Signed)
° °  Pre-operative Risk Assessment    Patient Name: Glenn Collins  DOB: Aug 21, 1945 MRN: [id number]      Request for Surgical Clearance    Procedure:   RIGHT TOTAL HIP ARTHROPLASTY   Date of Surgery:  Clearance TBD                                 Surgeon:  DR. Linna Caprice Surgeon's Group or Practice Name:   [facility] Phone number:   [phone]  Fax number:   [phone] ATTN:  KERRI MAZE   Type of Clearance Requested:   - Medical  - Pharmacy:  Hold Aspirin NOT INDICATED ON CLEARANCE    Type of Anesthesia:  Spinal   Additional requests/questions:      [name]   03/30/2021, 4:04 PM

## 2021-03-31 NOTE — Telephone Encounter (Signed)
S/w the pt and he is agreeable to pre op appt. We did not cancel Dr. Allyson Sabal 06/2021 appt, pt would like to keep that appt as well. Appt with Gillian Shields, NP 04/07/21 @ [facility] location, pt aware of location as he states his PCP is there as well. Pt thanked me for the call and the help. I will forward notes to NP for upcoming appt. Will send FYYI to surgeon's office pt has appt 04/07/21.

## 2021-04-05 NOTE — Progress Notes (Signed)
Office Visit    Patient Name: Glenn Collins Date of Encounter: 04/07/2021  PCP:  de [place], Raymond J, MD   [facility]  Cardiologist:  Nanetta Batty, MD  Advanced Practice Provider:  No care team member to display Electrophysiologist:  None   Chief Complaint    Glenn Collins is a 75 y.o. male with a hx of PAD, hypertension, CAD, bilateral carotid bruits, hyperlipidemia, complete heart block, stenosis of the left subclavian artery, and aortic stenosis presents today for follow-up and preop clearance.  Past Medical History    Past Medical History:  Diagnosis Date   Arthritis    CAD (coronary artery disease) 10/27/2018   Coronary artery disease    Heart murmur Since childhood   Hypertension    PAD (peripheral artery disease) (HCC) 10/27/2018   Past Surgical History:  Procedure Laterality Date   CARDIAC SURGERY     HERNIA REPAIR  2012   PACEMAKER IMPLANT N/A 09/29/2019   Procedure: PACEMAKER IMPLANT;  Surgeon: Marinus Maw, MD;  Location: [facility];  Service: Cardiovascular;  Laterality: N/A;    Allergies  No Known Allergies  History of Present Illness    Glenn Collins is a 75 y.o. male with a hx of PAD, hypertension, CAD, bilateral carotid bruits, hyperlipidemia, complete heart block, stenosis of the left subclavian artery, and aortic stenosis presents today for follow-up and preop clearance last seen 04/02/2020 by Dr. Allyson Sabal.  He was initially seen by Nada Boozer, NP in 2020 for evaluation of symptomatic PAD.  He was seen in the office by Dr. Gery Pray on 10/07/2019.  He does have a history of CAD status post RCA and LAD stenting in 2007 with 3 interventions in 2017 of the RCA for in-stent restenosis.  He has a history of hypertension and hyperlipidemia.  He never had heart attack or stroke.  He denied chest pain and shortness of breath.  He had smoked remotely but stopped about 20 years ago.  He has a history of claudication which is  fairly symmetric and lifestyle limiting especially walking up an incline.  He was evaluated in [place] and told that he had "blockages".   He apparently has had several syncopal episodes in the past and was noted to be bradycardic at this time.  His beta-blocker was held but he continued to have bradycardia related to syncope and ultimately underwent a dual-chamber permanent transvenous pacemaker inserted by Dr. Ladona Ridgel 03/2020.  His Pletal was discontinued at that time  During his last appointment a year ago his syncopal symptoms had resolved since implantation of his permanent pacemaker.  He had some mild claudication but this was not lifestyle limiting.  His Pletal was discontinued.  He denied chest pain and shortness of breath.  Carotid Dopplers were performed 12/26/2019 which did not show any significant left subclavian artery stenosis with atypical left vertebral filling.  He does complain of dizziness occasionally but has had this for many years since young childhood.  He denies left upper extremity claudication.  Today, he feels well without any significant cardiac symptoms. He has been doing some exercises at home for his hip but does have pain with walking. He is here for surgical clearance for total hip replacement. We discussed his PPM and he has not had any further syncope or presyncope since placement. He has not had any dizziness or lightheadedness. He has not had any chest pain or SOB. He does occasionally get SOB with exertion but this seems  like it is more due to deconditioning. We discussed a mediterranean diet and incorporating fresh fruits and vegetables into his diet with lean meats/fish. Whole, complex grains. He remains on statin therapy without issues. His LDL remains a little high at 82. He would like to try lifestyle modifications and re-check in a few months when he is back to see Dr. Allyson Sabal.   Reports no shortness of breath nor dyspnea on exertion that is cardiac related. Reports  no chest pain, pressure, or tightness. No edema, orthopnea, PND. Reports no palpitations.     EKGs/Labs/Other Studies Reviewed:   The following studies were reviewed today:  Echocardiogram 08/16/2020  IMPRESSIONS     1. Moderate hypertrophy of the basal septum with otherwise mild,  concentric LVH. Septal dyskinesis consistent with left ventricular pacing.  Apical hypokinesis. Left ventricular ejection fraction, by estimation, is  55 to 60%. The left ventricle has  normal function. The left ventricle demonstrates regional wall motion  abnormalities (see scoring diagram/findings for description). There is  moderate asymmetric left ventricular hypertrophy of the basal-septal  segment. Left ventricular diastolic  parameters are consistent with Grade I diastolic dysfunction (impaired  relaxation).   2. Right ventricular systolic function is normal. The right ventricular  size is normal.   3. Left atrial size was severely dilated.   4. The mitral valve is normal in structure. No evidence of mitral valve  regurgitation. No evidence of mitral stenosis.   5. Visually, aortic stenosis appears severe. However, based on mean gradient and dimensionless index it is mild-moderate. Aortic valve mean  gradient has increased minimally from 15 mmHg 08/2019 to 17 mmHg now. The  aortic valve is calcified. There is  severe calcifcation of the aortic valve. There is severe thickening of the  aortic valve. Aortic valve regurgitation is not visualized. Mild to  moderate aortic valve stenosis.   6. Aortic dilatation noted. There is mild dilatation of the aortic root,  measuring 37 mm. There is mild dilatation of the ascending aorta,  measuring 38 mm.   7. The inferior vena cava is normal in size with greater than 50%  respiratory variability, suggesting right atrial pressure of 3 mmHg.   EKG:  EKG is ordered today.  The ekg ordered today demonstrates V-paced and consistent with previous EKGs  Recent  Labs: 03/07/2021: ALT 14; BUN 16; Creatinine, Ser 1.06; Hemoglobin 12.7; Platelets 231; Potassium 4.9; Sodium 135  Recent Lipid Panel    Component Value Date/Time   CHOL 161 03/07/2021 1009   TRIG 59 03/07/2021 1009   HDL 67 03/07/2021 1009   CHOLHDL 2.4 03/07/2021 1009   LDLCALC 82 03/07/2021 1009    Home Medications   Current Meds  Medication Sig   ACETAMINOPHEN PO Take 600 mg by mouth daily as needed (Arthritis).   aspirin EC 81 MG tablet Take 81 mg by mouth daily.   atorvastatin (LIPITOR) 80 MG tablet TAKE ONE TABLET BY MOUTH DAILY AT 6PM   diphenhydrAMINE (BENADRYL) 25 MG tablet Take 25 mg by mouth at bedtime.   loratadine (CLARITIN) 10 MG tablet Take 10 mg by mouth daily as needed for allergies.    metoprolol succinate (TOPROL XL) 25 MG 24 hr tablet Take 1 tablet (25 mg total) by mouth daily.   Multiple Vitamin (MULTIVITAMIN WITH MINERALS) TABS tablet Take 1 tablet by mouth daily.   nitroGLYCERIN (NITROSTAT) 0.4 MG SL tablet Place 1 tablet (0.4 mg total) under the tongue every 5 (five) minutes x 3 doses as needed  for chest pain.   ramipril (ALTACE) 10 MG capsule Take 1 capsule (10 mg total) by mouth daily. Patient needs appointment for future refills.   sodium chloride (OCEAN) 0.65 % SOLN nasal spray Place 1 spray into both nostrils as needed for congestion.     Review of Systems      All other systems reviewed and are otherwise negative except as noted above.  Physical Exam    VS:  BP 122/60    Pulse (!) 102    Ht 5\' 11"  (1.803 m)    Wt 206 lb (93.4 kg)    BMI 28.73 kg/m  , BMI Body mass index is 28.73 kg/m.  Wt Readings from Last 3 Encounters:  04/07/21 206 lb (93.4 kg)  03/16/21 204 lb (92.5 kg)  03/14/21 203 lb 1.6 oz (92.1 kg)     GEN: Well nourished, well developed, in no acute distress. HEENT: normal. Neck: Supple, no JVD, carotid bruits, or masses. Cardiac: RRR, + systolic murmurs, rubs, or gallops. No clubbing, cyanosis, edema.  Radials/PT 2+ and equal  bilaterally.  Respiratory:  Respirations regular and unlabored, clear to auscultation bilaterally. GI: Soft, nontender, nondistended. MS: No deformity or atrophy. Skin: Warm and dry, no rash. Neuro:  Strength and sensation are intact. Psych: Normal affect.  Assessment & Plan    Pre-op Clearance    Mr. Lattanzio perioperative risk of a major cardiac event is 6.6% according to the Revised Cardiac Risk Index (RCRI).  Therefore, he is at low risk for perioperative complications.   His functional capacity is good at 5.07 METs according to the Duke Activity Status Index (DASI). Recommendations: According to ACC/AHA guidelines, no further cardiovascular testing needed.  The patient may proceed to surgery at acceptable risk.   Antiplatelet and/or Anticoagulation Recommendations: Due to PAD and CAD ASA should not be held if possible. Can be held for a few days if deemed surgically necessary   PAD  -He denies significant lifestyle limiting claudication -He sees vascular surgery -continues to do annual Roberto Scales with Dr. [place]  Hypertension -well controlled today in the clinic -Continue to take your BP at home -Continue metoprolol and Ramipril   CAD s/p PCI in 2007 with repeat stenting in 2017 for RCA -denies chest pain at this time -No SOB with or without activity -Myoview stress test performed 08/2019, no issues  Bilateral carotid bruits -Doppler performed 12/2019 showed no evidence of ICA stenosis  Hyperlipidemia -Lipid panel 04/2019 revealed total cholesterol 153, LDL 71, HDL 72 -Lipid panel 02/2021 showed total cholesterol 161, HDL 67, triglycerides 59, and LDL 82 -LDL goal is less than 70 -Continue atorvastatin 80 mg daily -Discussed dietary changes and will repeat a lipid panel in March before he sees Dr. April in March  [facility] -Status post permanent transvenous pacemaker placed by Dr. April June 2021 -No further symptoms  Stenosis of the left subclavian artery -Dopplers performed  12/2019 revealing significant left subclavian arterial stenosis with turbulent vertebral blood flow but only a 10 mm upper extremity blood pressure differential -He has had dizziness for years but does not complain of left upper extremity claudication -His syncopal episodes have improved with the permanent transvenous pacemaker insertion  Aortic Stenosis -2D echo performed 08/2019 showed mild aortic stenosis -Mild to moderate aortic stenosis on recent echocardiogram 08/2020  Disposition: Follow up 3 months with 09/2020, MD or APP.  Signed, Nanetta Batty, PA-C 04/07/2021, 9:30 AM [facility]

## 2021-04-07 ENCOUNTER — Other Ambulatory Visit: Payer: Self-pay

## 2021-04-07 ENCOUNTER — Ambulatory Visit (INDEPENDENT_AMBULATORY_CARE_PROVIDER_SITE_OTHER): Payer: Medicare PPO | Admitting: Physician Assistant

## 2021-04-07 ENCOUNTER — Encounter (HOSPITAL_BASED_OUTPATIENT_CLINIC_OR_DEPARTMENT_OTHER): Payer: Self-pay | Admitting: Physician Assistant

## 2021-04-07 VITALS — BP 122/60 | HR 102 | Ht 71.0 in | Wt 206.0 lb

## 2021-04-07 DIAGNOSIS — I442 Atrioventricular block, complete: Secondary | ICD-10-CM

## 2021-04-07 DIAGNOSIS — I2510 Atherosclerotic heart disease of native coronary artery without angina pectoris: Secondary | ICD-10-CM | POA: Diagnosis not present

## 2021-04-07 DIAGNOSIS — I739 Peripheral vascular disease, unspecified: Secondary | ICD-10-CM | POA: Diagnosis not present

## 2021-04-07 DIAGNOSIS — I350 Nonrheumatic aortic (valve) stenosis: Secondary | ICD-10-CM

## 2021-04-07 DIAGNOSIS — I771 Stricture of artery: Secondary | ICD-10-CM

## 2021-04-07 DIAGNOSIS — Z79899 Other long term (current) drug therapy: Secondary | ICD-10-CM | POA: Diagnosis not present

## 2021-04-07 DIAGNOSIS — R0989 Other specified symptoms and signs involving the circulatory and respiratory systems: Secondary | ICD-10-CM

## 2021-04-07 DIAGNOSIS — I10 Essential (primary) hypertension: Secondary | ICD-10-CM

## 2021-04-07 DIAGNOSIS — Z950 Presence of cardiac pacemaker: Secondary | ICD-10-CM

## 2021-04-07 NOTE — Patient Instructions (Signed)
Medication Instructions:  Your Physician recommend you continue on your current medication as directed.    *If you need a refill on your cardiac medications before your next appointment, please call your pharmacy*   Lab Work: Please return for Lab work Before your next appointment with Dr. Allyson Sabal for Fasting Lipid Panel and Liver Function Tests. You may come to the...   [facility] (3rd floor) [address] [place], [place] [address]  Open: 8am-Noon and 1pm-4:30pm   [facility] at [facility] [address]- Any location  **no appointments needed**  If you have labs (blood work) drawn today and your tests are completely normal, you will receive your results only by: [employer] (if you have MyChart) OR A paper copy in the mail If you have any lab test that is abnormal or we need to change your treatment, we will call you to review the results.   Testing/Procedures: You are cleared for your total hip replacement    Follow-Up: At [facility], you and your health needs are our priority.  As part of our continuing mission to provide you with exceptional heart care, we have created designated Provider Care Teams.  These Care Teams include your primary Cardiologist (physician) and Advanced Practice Providers (APPs -  Physician Assistants and Nurse Practitioners) who all work together to provide you with the care you need, when you need it.  We recommend signing up for the patient portal called "MyChart".  Sign up information is provided on this After Visit Summary.  MyChart is used to connect with patients for Virtual Visits (Telemedicine).  Patients are able to view lab/test results, encounter notes, upcoming appointments, etc.  Non-urgent messages can be sent to your provider as well.   To learn more about what you can do with MyChart, go to [email or web address].    Your next appointment:   March 3rd at 9:45am  The  format for your next appointment:   In Person  Provider:   Nanetta Batty, MD     Other Instructions Continue taking your statin and working on Lifestyle and diet modifications!

## 2021-04-08 NOTE — Progress Notes (Signed)
Remote pacemaker transmission.   

## 2021-04-20 ENCOUNTER — Other Ambulatory Visit: Payer: Self-pay

## 2021-04-20 MED ORDER — METOPROLOL SUCCINATE ER 25 MG PO TB24: 25.0000 mg | ORAL_TABLET | Freq: Every day | ORAL | 1 refills | Status: DC

## 2021-04-26 ENCOUNTER — Telehealth (HOSPITAL_BASED_OUTPATIENT_CLINIC_OR_DEPARTMENT_OTHER): Payer: Self-pay

## 2021-04-26 NOTE — Telephone Encounter (Signed)
Patient called in to request surgical clearance for hip surgery I informed patient that [facility] will need to fax over a clearance form and that when received Dr. de Peru will review and decide if an appointment is necessary of if patient can have clearance Patient states he has already seen cardiology for cardiac clearance Patient is aware and agreeable to plan and I told him if I hadn't received clearance form in the next 1-2 days I would follow up with [facility] Patient verbalized understanding and thanked me.

## 2021-04-27 ENCOUNTER — Other Ambulatory Visit: Payer: Self-pay

## 2021-04-27 MED ORDER — RAMIPRIL 10 MG PO CAPS: 10.0000 mg | ORAL_CAPSULE | Freq: Every day | ORAL | 1 refills | Status: DC

## 2021-05-04 ENCOUNTER — Encounter (HOSPITAL_BASED_OUTPATIENT_CLINIC_OR_DEPARTMENT_OTHER): Payer: Self-pay | Admitting: Family Medicine

## 2021-05-04 ENCOUNTER — Other Ambulatory Visit: Payer: Self-pay

## 2021-05-04 ENCOUNTER — Ambulatory Visit (HOSPITAL_BASED_OUTPATIENT_CLINIC_OR_DEPARTMENT_OTHER): Payer: Medicare PPO | Admitting: Family Medicine

## 2021-05-04 VITALS — BP 154/88 | HR 51 | Ht 71.0 in | Wt 206.0 lb

## 2021-05-04 DIAGNOSIS — Z01818 Encounter for other preprocedural examination: Secondary | ICD-10-CM | POA: Diagnosis not present

## 2021-05-04 LAB — POCT URINALYSIS DIPSTICK
Bilirubin, UA: NEGATIVE
Blood, UA: NEGATIVE
Glucose, UA: NEGATIVE
Ketones, UA: NEGATIVE
Leukocytes, UA: NEGATIVE
Nitrite, UA: NEGATIVE
Protein, UA: NEGATIVE
Spec Grav, UA: 1.02 (ref 1.010–1.025)
Urobilinogen, UA: 0.2 E.U./dL
pH, UA: 5.5 (ref 5.0–8.0)

## 2021-05-04 NOTE — Patient Instructions (Signed)
°  Medication Instructions:  Your physician recommends that you continue on your current medications as directed. Please refer to the Current Medication list given to you today. --If you need a refill on any your medications before your next appointment, please call your pharmacy first. If no refills are authorized on file call the office.--  Follow-Up: Your next appointment:   Your physician recommends that you KEEP SCHEDULED FOLLOW UP WITH Dr. de Peru  You will receive a text message or e-mail with a link to a survey about your care and experience with [place] today! We would greatly appreciate your feedback!   Thanks for letting us be apart of your health journey!!  Primary Care and Sports Medicine   Dr. Ceasar Mons Peru   We encourage you to activate your patient portal called "MyChart".  Sign up information is provided on this After Visit Summary.  MyChart is used to connect with patients for Virtual Visits (Telemedicine).  Patients are able to view lab/test results, encounter notes, upcoming appointments, etc.  Non-urgent messages can be sent to your provider as well. To learn more about what you can do with MyChart, please visit --  [email or web address].

## 2021-05-04 NOTE — Assessment & Plan Note (Signed)
Patient stable in office today, no acute cardiopulmonary symptoms, physical exam without any new abnormalities Review of surgeon preoperative request indicates wanting lab evaluation for CBC, BMP, albumin, INR, hemoglobin A1c and urinalysis Patient did have hemoglobin A1c relatively recently, thus will not check today, has had normal prior readings We will proceed with labs otherwise as requested today Has had preoperative clearance with cardiology and had EKG performed that time without concerns.  Cardiology recommendations of continuing with aspirin perioperatively unless surgeon is wanting to hold around time of procedure

## 2021-05-04 NOTE — Progress Notes (Addendum)
° ° °  Procedures performed today:    None.  Independent interpretation of notes and tests performed by another provider:   None.  Brief History, Exam, Impression, and Recommendations:    Glenn Collins is a 76 y.o. presenting for preoperative evaluation/clearance. The planned procedure is right total hip arthroplasty with the treatment goal of improved function/pain relief. Cardiac risk for planned procedure is Intermediate (1 to 5%) - intraperitoneal or intrathoracic surgery, carotid endarterectomy, head and neck surgery, orthopedic surgery, prostate surgery  Signs or symptoms of cardiovascular disease? No New or unstable cardiopulmonary signs or symptoms? No Urinary symptoms or undergoing surgical implantation of foreign material (prosthetic joint, heart valve) or invasive urologic procedure? Yes  BP (!) 154/88    Pulse (!) 51    Ht 5\' 11"  (1.803 m)    Wt 206 lb (93.4 kg)    SpO2 97%    BMI 28.73 kg/m   Exam: 76 year old male in no acute distress Cardiovascular exam with regular rate and rhythm, systolic murmur present Lungs clear to auscultation bilaterally  Preoperative clearance Patient stable in office today, no acute cardiopulmonary symptoms, physical exam without any new abnormalities Review of surgeon preoperative request indicates wanting lab evaluation for CBC, BMP, albumin, INR, hemoglobin A1c and urinalysis Patient did have hemoglobin A1c relatively recently, thus will not check today, has had normal prior readings We will proceed with labs otherwise as requested today Has had preoperative clearance with cardiology and had EKG performed that time without concerns.  Cardiology recommendations of continuing with aspirin perioperatively unless surgeon is wanting to hold around time of procedure  Based on history and exam will order the following preoperative tests: CBC, BMP, albumin, INR, urinalysis  Medical clearance for surgery pending results of above tests. Once  received, will review and make determination of medical clearance.   ___________________________________________ [name] de [address], MD, ABFM, CAQSM Primary Care and Sports Medicine [facility]  Initial laboratory evaluation showed hyperkalemia.  Follow-up BMP with potassium returned to normal range.  As such, patient is medically cleared to proceed with planned surgery. He has had prior evaluation and clearance with cardiology, cardiac recommendations discussed separately.

## 2021-05-05 LAB — CBC WITH DIFFERENTIAL/PLATELET
Basophils Absolute: 0.1 10*3/uL (ref 0.0–0.2)
Basos: 1 %
EOS (ABSOLUTE): 0.1 10*3/uL (ref 0.0–0.4)
Eos: 1 %
Hematocrit: 39.3 % (ref 37.5–51.0)
Hemoglobin: 13.1 g/dL (ref 13.0–17.7)
Immature Grans (Abs): 0 10*3/uL (ref 0.0–0.1)
Immature Granulocytes: 1 %
Lymphocytes Absolute: 1.1 10*3/uL (ref 0.7–3.1)
Lymphs: 15 %
MCH: 31.4 pg (ref 26.6–33.0)
MCHC: 33.3 g/dL (ref 31.5–35.7)
MCV: 94 fL (ref 79–97)
Monocytes Absolute: 1.1 10*3/uL — ABNORMAL HIGH (ref 0.1–0.9)
Monocytes: 15 %
Neutrophils Absolute: 5.3 10*3/uL (ref 1.4–7.0)
Neutrophils: 67 %
Platelets: 237 10*3/uL (ref 150–450)
RBC: 4.17 x10E6/uL (ref 4.14–5.80)
RDW: 12.8 % (ref 11.6–15.4)
WBC: 7.7 10*3/uL (ref 3.4–10.8)

## 2021-05-05 LAB — ALBUMIN: Albumin: 5 g/dL — ABNORMAL HIGH (ref 3.7–4.7)

## 2021-05-05 LAB — BASIC METABOLIC PANEL
BUN/Creatinine Ratio: 17 (ref 10–24)
BUN: 17 mg/dL (ref 8–27)
CO2: 23 mmol/L (ref 20–29)
Calcium: 9.9 mg/dL (ref 8.6–10.2)
Chloride: 98 mmol/L (ref 96–106)
Creatinine, Ser: 0.99 mg/dL (ref 0.76–1.27)
Glucose: 115 mg/dL — ABNORMAL HIGH (ref 70–99)
Potassium: 5.4 mmol/L — ABNORMAL HIGH (ref 3.5–5.2)
Sodium: 137 mmol/L (ref 134–144)
eGFR: 79 mL/min/{1.73_m2} (ref >59)

## 2021-05-05 LAB — PROTIME-INR
INR: 1 (ref 0.9–1.2)
Prothrombin Time: 10.3 s (ref 9.1–12.0)

## 2021-05-06 ENCOUNTER — Other Ambulatory Visit: Payer: Self-pay

## 2021-05-06 MED ORDER — RAMIPRIL 10 MG PO CAPS: 10.0000 mg | ORAL_CAPSULE | Freq: Every day | ORAL | 3 refills | Status: DC

## 2021-05-10 ENCOUNTER — Telehealth (HOSPITAL_BASED_OUTPATIENT_CLINIC_OR_DEPARTMENT_OTHER): Payer: Self-pay

## 2021-05-10 DIAGNOSIS — E875 Hyperkalemia: Secondary | ICD-10-CM

## 2021-05-10 DIAGNOSIS — Z01818 Encounter for other preprocedural examination: Secondary | ICD-10-CM

## 2021-05-10 NOTE — Telephone Encounter (Signed)
Patient is aware and agreeable to lab results and recommendation Patient will repeat BMP on 01/26 Orders in

## 2021-05-10 NOTE — Telephone Encounter (Signed)
-----   Message from Hosie Poisson Peru, MD sent at 05/05/2021  2:08 PM EST ----- Labs overall are unremarkable, except for slight elevation in potassium.  Due to this, would plan to repeat a BMP next week to ensure this has returned to normal range.  Would make sure to stay well-hydrated, avoid use of NSAIDs such as ibuprofen, Aleve, Advil.

## 2021-05-12 ENCOUNTER — Ambulatory Visit (HOSPITAL_BASED_OUTPATIENT_CLINIC_OR_DEPARTMENT_OTHER): Payer: Medicare PPO

## 2021-05-12 ENCOUNTER — Other Ambulatory Visit: Payer: Self-pay

## 2021-05-12 DIAGNOSIS — Z79899 Other long term (current) drug therapy: Secondary | ICD-10-CM | POA: Diagnosis not present

## 2021-05-12 DIAGNOSIS — E875 Hyperkalemia: Secondary | ICD-10-CM | POA: Diagnosis not present

## 2021-05-12 DIAGNOSIS — Z01818 Encounter for other preprocedural examination: Secondary | ICD-10-CM | POA: Diagnosis not present

## 2021-05-12 DIAGNOSIS — I739 Peripheral vascular disease, unspecified: Secondary | ICD-10-CM | POA: Diagnosis not present

## 2021-05-13 LAB — LIPID PANEL
Chol/HDL Ratio: 2.5 ratio (ref 0.0–5.0)
Cholesterol, Total: 161 mg/dL (ref 100–199)
HDL: 64 mg/dL (ref >39)
LDL Chol Calc (NIH): 82 mg/dL (ref 0–99)
Triglycerides: 79 mg/dL (ref 0–149)
VLDL Cholesterol Cal: 15 mg/dL (ref 5–40)

## 2021-05-13 LAB — HEPATIC FUNCTION PANEL
ALT: 17 IU/L (ref 0–44)
AST: 25 IU/L (ref 0–40)
Albumin: 5.1 g/dL — ABNORMAL HIGH (ref 3.7–4.7)
Alkaline Phosphatase: 96 IU/L (ref 44–121)
Bilirubin Total: 0.5 mg/dL (ref 0.0–1.2)
Bilirubin, Direct: 0.17 mg/dL (ref 0.00–0.40)
Total Protein: 7.1 g/dL (ref 6.0–8.5)

## 2021-05-18 LAB — BASIC METABOLIC PANEL
BUN/Creatinine Ratio: 16 (ref 10–24)
BUN: 19 mg/dL (ref 8–27)
CO2: 21 mmol/L (ref 20–29)
Calcium: 9.9 mg/dL (ref 8.6–10.2)
Chloride: 95 mmol/L — ABNORMAL LOW (ref 96–106)
Creatinine, Ser: 1.22 mg/dL (ref 0.76–1.27)
Glucose: 148 mg/dL — ABNORMAL HIGH (ref 70–99)
Potassium: 4.9 mmol/L (ref 3.5–5.2)
Sodium: 138 mmol/L (ref 134–144)
eGFR: 62 mL/min/{1.73_m2} (ref >59)

## 2021-05-18 LAB — SPECIMEN STATUS REPORT

## 2021-05-20 ENCOUNTER — Ambulatory Visit: Payer: Self-pay | Admitting: Orthopedic Surgery

## 2021-06-07 ENCOUNTER — Ambulatory Visit: Payer: Self-pay | Admitting: Orthopedic Surgery

## 2021-06-07 NOTE — H&P (Signed)
TOTAL HIP ADMISSION H&P  Patient is admitted for right total hip arthroplasty.  Subjective:  Chief Complaint: right hip pain  HPI: Glenn Collins, 76 y.o. male, has a history of pain and functional disability in the right hip(s) due to arthritis and patient has failed non-surgical conservative treatments for greater than 12 weeks to include NSAID's and/or analgesics, flexibility and strengthening excercises, use of assistive devices, weight reduction as appropriate, and activity modification.  Onset of symptoms was gradual starting >10 years ago with rapidlly worsening course since that time.The patient noted no past surgery on the right hip(s).  Patient currently rates pain in the right hip at 10 out of 10 with activity. Patient has night pain, worsening of pain with activity and weight bearing, trendelenberg gait, pain that interfers with activities of daily living, and pain with passive range of motion. Patient has evidence of subchondral cysts, subchondral sclerosis, periarticular osteophytes, and joint space narrowing by imaging studies. This condition presents safety issues increasing the risk of falls. There is no current active infection.  Patient Active Problem List   Diagnosis Date Noted   Preoperative clearance 05/04/2021   Wellness examination 03/14/2021   Valvular heart disease 12/23/2020   Dyslipidemia 12/23/2020   Greater trochanteric pain syndrome of right lower extremity 12/23/2020   Stenosis of left subclavian artery (HCC) 04/02/2020   Aortic stenosis 04/02/2020   Pacemaker 01/05/2020   Complete heart block (HCC) 09/28/2019   Syncope 09/08/2019   Hyperlipidemia 04/22/2019   Bilateral carotid bruits 12/24/2018   PAD (peripheral artery disease) (HCC) 10/27/2018   Hypertension 10/27/2018   CAD (coronary artery disease) 10/27/2018   NSTEMI (non-ST elevated myocardial infarction) 10/27/2018   Acute back pain 10/27/2018   Past Medical History:  Diagnosis Date   Arthritis     CAD (coronary artery disease) 10/27/2018   Coronary artery disease    Heart murmur Since childhood   Hypertension    PAD (peripheral artery disease) (HCC) 10/27/2018    Past Surgical History:  Procedure Laterality Date   CARDIAC SURGERY     HERNIA REPAIR  2012   PACEMAKER IMPLANT N/A 09/29/2019   Procedure: PACEMAKER IMPLANT;  Surgeon: Marinus Maw, MD;  Location: [facility];  Service: Cardiovascular;  Laterality: N/A;    Current Outpatient Medications  Medication Sig Dispense Refill Last Dose   acetaminophen (TYLENOL) 650 MG CR tablet Take 650 mg by mouth every 8 (eight) hours as needed for pain.      aspirin EC 81 MG tablet Take 81 mg by mouth daily.      atorvastatin (LIPITOR) 80 MG tablet TAKE ONE TABLET BY MOUTH DAILY AT 6PM 90 tablet 0    diphenhydrAMINE (BENADRYL) 25 MG tablet Take 25 mg by mouth at bedtime as needed for sleep or allergies.      metoprolol succinate (TOPROL XL) 25 MG 24 hr tablet Take 1 tablet (25 mg total) by mouth daily. 30 tablet 1    Multiple Vitamin (MULTIVITAMIN WITH MINERALS) TABS tablet Take 1 tablet by mouth daily. (Patient not taking: Reported on 06/02/2021)      nitroGLYCERIN (NITROSTAT) 0.4 MG SL tablet Place 1 tablet (0.4 mg total) under the tongue every 5 (five) minutes x 3 doses as needed for chest pain. 25 tablet 1    ramipril (ALTACE) 10 MG capsule Take 1 capsule (10 mg total) by mouth daily. 90 capsule 3    sodium chloride (OCEAN) 0.65 % SOLN nasal spray Place 1 spray into both nostrils as  needed for congestion.      No current facility-administered medications for this visit.   No Known Allergies  Social History   Tobacco Use   Smoking status: Former    Packs/day: 1.50    Years: 40.00    Pack years: 60.00    Types: Cigarettes    Quit date: 10/15/2000    Years since quitting: 20.6   Smokeless tobacco: Never  Substance Use Topics   Alcohol use: Yes    Alcohol/week: 20.0 standard drinks    Types: 20 Glasses of wine per week     Family History  Problem Relation Age of Onset   Cancer Mother    Hearing loss Mother    CVA Father      Review of Systems  Musculoskeletal:  Positive for arthralgias and gait problem.  All other systems reviewed and are negative.  Objective:  Physical Exam Constitutional:      Appearance: Normal appearance.  HENT:     Head: Normocephalic and atraumatic.     Nose: Nose normal.     Mouth/Throat:     Mouth: Mucous membranes are moist.     Pharynx: Oropharynx is clear.  Eyes:     Extraocular Movements: Extraocular movements intact.     Conjunctiva/sclera: Conjunctivae normal.     Pupils: Pupils are equal, round, and reactive to light.  Cardiovascular:     Rate and Rhythm: Normal rate and regular rhythm.     Pulses: Normal pulses.  Pulmonary:     Effort: Pulmonary effort is normal. No respiratory distress.  Abdominal:     General: Abdomen is flat. There is no distension.     Palpations: Abdomen is soft.  Genitourinary:    Comments: deferred Musculoskeletal:     Cervical back: Normal range of motion and neck supple.     Right hip: Bony tenderness present. Decreased range of motion. Decreased strength.  Skin:    General: Skin is warm and dry.     Capillary Refill: Capillary refill takes less than 2 seconds.  Neurological:     General: No focal deficit present.     Mental Status: He is alert and oriented to person, place, and time.  Psychiatric:        Mood and Affect: Mood normal.        Behavior: Behavior normal.        Thought Content: Thought content normal.        Judgment: Judgment normal.    Vital signs in last 24 hours: @VSRANGES @  Labs:   Estimated body mass index is 28.73 kg/m as calculated from the following:   Height as of 05/04/21: 5\' 11"  (1.803 m).   Weight as of 05/04/21: 93.4 kg.   Imaging Review Plain radiographs demonstrate severe degenerative joint disease of the right hip(s). The bone quality appears to be adequate for age and reported  activity level.      Assessment/Plan:  End stage arthritis, right hip(s)  The patient history, physical examination, clinical judgement of the provider and imaging studies are consistent with end stage degenerative joint disease of the right hip(s) and total hip arthroplasty is deemed medically necessary. The treatment options including medical management, injection therapy, arthroscopy and arthroplasty were discussed at length. The risks and benefits of total hip arthroplasty were presented and reviewed. The risks due to aseptic loosening, infection, stiffness, dislocation/subluxation,  thromboembolic complications and other imponderables were discussed.  The patient acknowledged the explanation, agreed to proceed with the plan and consent  was signed. Patient is being admitted for inpatient treatment for surgery, pain control, PT, OT, prophylactic antibiotics, VTE prophylaxis, progressive ambulation and ADL's and discharge planning.The patient is planning to be discharged  home with HEP.  Home with wife. RW ordered.    Patient's anticipated LOS is less than 2 midnights, meeting these requirements: - Younger than 26 - Lives within 1 hour of care - Has a competent adult at home to recover with post-op recover - NO history of  - Chronic pain requiring opiods  - Diabetes  - Heart failure  - Heart attack  - Stroke  - DVT/VTE  - Cardiac arrhythmia  - Respiratory Failure/COPD  - Renal failure  - Anemia  - Advanced Liver disease

## 2021-06-07 NOTE — H&P (View-Only) (Signed)
TOTAL HIP ADMISSION H&P  Patient is admitted for right total hip arthroplasty.  Subjective:  Chief Complaint: right hip pain  HPI: Glenn Collins, 76 y.o. male, has a history of pain and functional disability in the right hip(s) due to arthritis and patient has failed non-surgical conservative treatments for greater than 12 weeks to include NSAID's and/or analgesics, flexibility and strengthening excercises, use of assistive devices, weight reduction as appropriate, and activity modification.  Onset of symptoms was gradual starting >10 years ago with rapidlly worsening course since that time.The patient noted no past surgery on the right hip(s).  Patient currently rates pain in the right hip at 10 out of 10 with activity. Patient has night pain, worsening of pain with activity and weight bearing, trendelenberg gait, pain that interfers with activities of daily living, and pain with passive range of motion. Patient has evidence of subchondral cysts, subchondral sclerosis, periarticular osteophytes, and joint space narrowing by imaging studies. This condition presents safety issues increasing the risk of falls. There is no current active infection.  Patient Active Problem List   Diagnosis Date Noted   Preoperative clearance 05/04/2021   Wellness examination 03/14/2021   Valvular heart disease 12/23/2020   Dyslipidemia 12/23/2020   Greater trochanteric pain syndrome of right lower extremity 12/23/2020   Stenosis of left subclavian artery (HCC) 04/02/2020   Aortic stenosis 04/02/2020   Pacemaker 01/05/2020   Complete heart block (HCC) 09/28/2019   Syncope 09/08/2019   Hyperlipidemia 04/22/2019   Bilateral carotid bruits 12/24/2018   PAD (peripheral artery disease) (HCC) 10/27/2018   Hypertension 10/27/2018   CAD (coronary artery disease) 10/27/2018   NSTEMI (non-ST elevated myocardial infarction) 10/27/2018   Acute back pain 10/27/2018   Past Medical History:  Diagnosis Date   Arthritis     CAD (coronary artery disease) 10/27/2018   Coronary artery disease    Heart murmur Since childhood   Hypertension    PAD (peripheral artery disease) (HCC) 10/27/2018    Past Surgical History:  Procedure Laterality Date   CARDIAC SURGERY     HERNIA REPAIR  2012   PACEMAKER IMPLANT N/A 09/29/2019   Procedure: PACEMAKER IMPLANT;  Surgeon: Marinus Maw, MD;  Location: [facility];  Service: Cardiovascular;  Laterality: N/A;    Current Outpatient Medications  Medication Sig Dispense Refill Last Dose   acetaminophen (TYLENOL) 650 MG CR tablet Take 650 mg by mouth every 8 (eight) hours as needed for pain.      aspirin EC 81 MG tablet Take 81 mg by mouth daily.      atorvastatin (LIPITOR) 80 MG tablet TAKE ONE TABLET BY MOUTH DAILY AT 6PM 90 tablet 0    diphenhydrAMINE (BENADRYL) 25 MG tablet Take 25 mg by mouth at bedtime as needed for sleep or allergies.      metoprolol succinate (TOPROL XL) 25 MG 24 hr tablet Take 1 tablet (25 mg total) by mouth daily. 30 tablet 1    Multiple Vitamin (MULTIVITAMIN WITH MINERALS) TABS tablet Take 1 tablet by mouth daily. (Patient not taking: Reported on 06/02/2021)      nitroGLYCERIN (NITROSTAT) 0.4 MG SL tablet Place 1 tablet (0.4 mg total) under the tongue every 5 (five) minutes x 3 doses as needed for chest pain. 25 tablet 1    ramipril (ALTACE) 10 MG capsule Take 1 capsule (10 mg total) by mouth daily. 90 capsule 3    sodium chloride (OCEAN) 0.65 % SOLN nasal spray Place 1 spray into both nostrils as  needed for congestion.      No current facility-administered medications for this visit.   No Known Allergies  Social History   Tobacco Use   Smoking status: Former    Packs/day: 1.50    Years: 40.00    Pack years: 60.00    Types: Cigarettes    Quit date: 10/15/2000    Years since quitting: 20.6   Smokeless tobacco: Never  Substance Use Topics   Alcohol use: Yes    Alcohol/week: 20.0 standard drinks    Types: 20 Glasses of wine per week     Family History  Problem Relation Age of Onset   Cancer Mother    Hearing loss Mother    CVA Father      Review of Systems  Musculoskeletal:  Positive for arthralgias and gait problem.  All other systems reviewed and are negative.  Objective:  Physical Exam Constitutional:      Appearance: Normal appearance.  HENT:     Head: Normocephalic and atraumatic.     Nose: Nose normal.     Mouth/Throat:     Mouth: Mucous membranes are moist.     Pharynx: Oropharynx is clear.  Eyes:     Extraocular Movements: Extraocular movements intact.     Conjunctiva/sclera: Conjunctivae normal.     Pupils: Pupils are equal, round, and reactive to light.  Cardiovascular:     Rate and Rhythm: Normal rate and regular rhythm.     Pulses: Normal pulses.  Pulmonary:     Effort: Pulmonary effort is normal. No respiratory distress.  Abdominal:     General: Abdomen is flat. There is no distension.     Palpations: Abdomen is soft.  Genitourinary:    Comments: deferred Musculoskeletal:     Cervical back: Normal range of motion and neck supple.     Right hip: Bony tenderness present. Decreased range of motion. Decreased strength.  Skin:    General: Skin is warm and dry.     Capillary Refill: Capillary refill takes less than 2 seconds.  Neurological:     General: No focal deficit present.     Mental Status: He is alert and oriented to person, place, and time.  Psychiatric:        Mood and Affect: Mood normal.        Behavior: Behavior normal.        Thought Content: Thought content normal.        Judgment: Judgment normal.    Vital signs in last 24 hours: @VSRANGES @  Labs:   Estimated body mass index is 28.73 kg/m as calculated from the following:   Height as of 05/04/21: 5\' 11"  (1.803 m).   Weight as of 05/04/21: 93.4 kg.   Imaging Review Plain radiographs demonstrate severe degenerative joint disease of the right hip(s). The bone quality appears to be adequate for age and reported  activity level.      Assessment/Plan:  End stage arthritis, right hip(s)  The patient history, physical examination, clinical judgement of the provider and imaging studies are consistent with end stage degenerative joint disease of the right hip(s) and total hip arthroplasty is deemed medically necessary. The treatment options including medical management, injection therapy, arthroscopy and arthroplasty were discussed at length. The risks and benefits of total hip arthroplasty were presented and reviewed. The risks due to aseptic loosening, infection, stiffness, dislocation/subluxation,  thromboembolic complications and other imponderables were discussed.  The patient acknowledged the explanation, agreed to proceed with the plan and consent  was signed. Patient is being admitted for inpatient treatment for surgery, pain control, PT, OT, prophylactic antibiotics, VTE prophylaxis, progressive ambulation and ADL's and discharge planning.The patient is planning to be discharged  home with HEP.  Home with wife. RW ordered.    Patient's anticipated LOS is less than 2 midnights, meeting these requirements: - Younger than 26 - Lives within 1 hour of care - Has a competent adult at home to recover with post-op recover - NO history of  - Chronic pain requiring opiods  - Diabetes  - Heart failure  - Heart attack  - Stroke  - DVT/VTE  - Cardiac arrhythmia  - Respiratory Failure/COPD  - Renal failure  - Anemia  - Advanced Liver disease

## 2021-06-07 NOTE — Patient Instructions (Addendum)
DUE TO COVID-19 ONLY ONE VISITOR IS ALLOWED TO COME WITH YOU AND STAY IN THE WAITING ROOM ONLY DURING PRE OP AND PROCEDURE DAY OF SURGERY IF YOU ARE GOING HOME AFTER SURGERY. IF YOU ARE SPENDING THE NIGHT 2 PEOPLE MAY VISIT WITH YOU IN YOUR PRIVATE ROOM AFTER SURGERY UNTIL VISITING  HOURS ARE OVER AT 8:00 PM AND 1  VISITOR  MAY  SPEND THE NIGHT.   YOU NEED TO HAVE A COVID 19 TEST ON__2/27 at 9:15___ THIS TEST MUST BE DONE BEFORE SURGERY,                 [name]     Your procedure is scheduled on: 06/15/21   Report to [facility] Main  Entrance   Report to short stay at 5:45 AM     Call this number if you have problems the morning of surgery [phone]    No food after midnight.    You may have clear liquid until 5:30 AM.    At 5:15 AM drink pre surgery drink.   Nothing by mouth after 5:30 AM.   CLEAR LIQUID DIET   Foods Allowed                                                                     Foods Excluded  Coffee and tea, regular and decaf                             liquids that you cannot  Plain Jell-O any favor except red or purple                                           see through such as: Fruit ices (not with fruit pulp)                                     milk, soups, orange juice  Iced Popsicles                                    All solid food Carbonated beverages, regular and diet                                    Cranberry, grape and apple juices Sports drinks like Gatorade Lightly seasoned clear broth or consume(fat free) Sugar     BRUSH YOUR TEETH MORNING OF SURGERY AND RINSE YOUR MOUTH OUT, NO CHEWING GUM CANDY OR MINTS.     Take these medicines the morning of surgery with A SIP OF WATER: Metoprolol                                You may not have any metal on your body including              piercings  Do not wear jewelry, lotions, powders or deodorant             Men may shave face and neck.   Do not bring valuables to the  hospital. [facility] IS NOT             RESPONSIBLE   FOR VALUABLES.  Contacts, dentures or bridgework may not be worn into surgery.  Leave suitcase in the car. After surgery it may be brought to your room.                  [facility] - Preparing for Surgery Before surgery, you can play an important role.  Because skin is not sterile, your skin needs to be as free of germs as possible.  You can reduce the number of germs on your skin by washing with CHG (chlorahexidine gluconate) soap before surgery.  CHG is an antiseptic cleaner which kills germs and bonds with the skin to continue killing germs even after washing. Please DO NOT use if you have an allergy to CHG or antibacterial soaps.  If your skin becomes reddened/irritated stop using the CHG and inform your nurse when you arrive at Short Stay.  You may shave your face/neck. Please follow these instructions carefully:  1.  Shower with CHG Soap the night before surgery and the  morning of Surgery.  2.  If you choose to wash your hair, wash your hair first as usual with your  normal  shampoo.  3.  After you shampoo, rinse your hair and body thoroughly to remove the  shampoo.                            4.  Use CHG as you would any other liquid soap.  You can apply chg directly  to the skin and wash                       Gently with a scrungie or clean washcloth.  5.  Apply the CHG Soap to your body ONLY FROM THE NECK DOWN.   Do not use on face/ open                           Wound or open sores. Avoid contact with eyes, ears mouth and genitals (private parts).                       Wash face,  Genitals (private parts) with your normal soap.             6.  Wash thoroughly, paying special attention to the area where your surgery  will be performed.  7.  Thoroughly rinse your body with warm water from the neck down.  8.  DO NOT shower/wash with your normal soap after using and rinsing off  the CHG Soap.                9.  Pat yourself dry  with a clean towel.            10.  Wear clean pajamas.            11.  Place clean sheets on your bed the night of your first shower and do not  sleep with pets. Day of Surgery : Do not apply any lotions/deodorants the morning of surgery.  Please wear clean clothes  to the hospital/surgery center.  FAILURE TO FOLLOW THESE INSTRUCTIONS MAY RESULT IN THE CANCELLATION OF YOUR SURGERY PATIENT SIGNATURE_________________________________  NURSE SIGNATURE__________________________________  ________________________________________________________________________   Rogelia Mire  An incentive spirometer is a tool that can help keep your lungs clear and active. This tool measures how well you are filling your lungs with each breath. Taking long deep breaths may help reverse or decrease the chance of developing breathing (pulmonary) problems (especially infection) following: A long period of time when you are unable to move or be active. BEFORE THE PROCEDURE  If the spirometer includes an indicator to show your best effort, your nurse or respiratory therapist will set it to a desired goal. If possible, sit up straight or lean slightly forward. Try not to slouch. Hold the incentive spirometer in an upright position. INSTRUCTIONS FOR USE  Sit on the edge of your bed if possible, or sit up as far as you can in bed or on a chair. Hold the incentive spirometer in an upright position. Breathe out normally. Place the mouthpiece in your mouth and seal your lips tightly around it. Breathe in slowly and as deeply as possible, raising the piston or the ball toward the top of the column. Hold your breath for 3-5 seconds or for as long as possible. Allow the piston or ball to fall to the bottom of the column. Remove the mouthpiece from your mouth and breathe out normally. Rest for a few seconds and repeat Steps 1 through 7 at least 10 times every 1-2 hours when you are awake. Take your time and take a few  normal breaths between deep breaths. The spirometer may include an indicator to show your best effort. Use the indicator as a goal to work toward during each repetition. After each set of 10 deep breaths, practice coughing to be sure your lungs are clear. If you have an incision (the cut made at the time of surgery), support your incision when coughing by placing a pillow or rolled up towels firmly against it. Once you are able to get out of bed, walk around indoors and cough well. You may stop using the incentive spirometer when instructed by your caregiver.  RISKS AND COMPLICATIONS Take your time so you do not get dizzy or light-headed. If you are in pain, you may need to take or ask for pain medication before doing incentive spirometry. It is harder to take a deep breath if you are having pain. AFTER USE Rest and breathe slowly and easily. It can be helpful to keep track of a log of your progress. Your caregiver can provide you with a simple table to help with this. If you are using the spirometer at home, follow these instructions: SEEK MEDICAL CARE IF:  You are having difficultly using the spirometer. You have trouble using the spirometer as often as instructed. Your pain medication is not giving enough relief while using the spirometer. You develop fever of 100.5 F (38.1 C) or higher. SEEK IMMEDIATE MEDICAL CARE IF:  You cough up bloody sputum that had not been present before. You develop fever of 102 F (38.9 C) or greater. You develop worsening pain at or near the incision site. MAKE SURE YOU:  Understand these instructions. Will watch your condition. Will get help right away if you are not doing well or get worse. Document Released: 08/14/2006 Document Revised: 06/26/2011 Document Reviewed: 10/15/2006 [facility] Patient Information 2014 [place], [place].   ________________________________________________________________________

## 2021-06-08 ENCOUNTER — Other Ambulatory Visit: Payer: Self-pay

## 2021-06-08 ENCOUNTER — Encounter (HOSPITAL_COMMUNITY)
Admission: RE | Admit: 2021-06-08 | Discharge: 2021-06-08 | Disposition: A | Discharge status: Home / Self Care | Payer: Medicare PPO | Source: Ambulatory Visit | Attending: Orthopedic Surgery | Admitting: Orthopedic Surgery

## 2021-06-08 ENCOUNTER — Encounter (HOSPITAL_COMMUNITY): Payer: Self-pay

## 2021-06-08 VITALS — BP 145/76 | HR 98 | Temp 98.0°F | Resp 18 | Ht 71.0 in | Wt 204.0 lb

## 2021-06-08 DIAGNOSIS — Z01812 Encounter for preprocedural laboratory examination: Secondary | ICD-10-CM | POA: Diagnosis not present

## 2021-06-08 DIAGNOSIS — Z87891 Personal history of nicotine dependence: Secondary | ICD-10-CM | POA: Insufficient documentation

## 2021-06-08 DIAGNOSIS — I10 Essential (primary) hypertension: Secondary | ICD-10-CM | POA: Diagnosis not present

## 2021-06-08 DIAGNOSIS — I739 Peripheral vascular disease, unspecified: Secondary | ICD-10-CM | POA: Diagnosis not present

## 2021-06-08 DIAGNOSIS — Z01818 Encounter for other preprocedural examination: Secondary | ICD-10-CM

## 2021-06-08 DIAGNOSIS — Z20822 Contact with and (suspected) exposure to covid-19: Secondary | ICD-10-CM | POA: Diagnosis not present

## 2021-06-08 DIAGNOSIS — I2510 Atherosclerotic heart disease of native coronary artery without angina pectoris: Secondary | ICD-10-CM | POA: Insufficient documentation

## 2021-06-08 DIAGNOSIS — Z950 Presence of cardiac pacemaker: Secondary | ICD-10-CM | POA: Insufficient documentation

## 2021-06-08 DIAGNOSIS — M1611 Unilateral primary osteoarthritis, right hip: Secondary | ICD-10-CM | POA: Insufficient documentation

## 2021-06-08 HISTORY — DX: Presence of cardiac pacemaker: Z95.0

## 2021-06-08 LAB — BASIC METABOLIC PANEL
Anion gap: 7 (ref 5–15)
BUN: 17 mg/dL (ref 8–23)
CO2: 25 mmol/L (ref 22–32)
Calcium: 9.2 mg/dL (ref 8.9–10.3)
Chloride: 97 mmol/L — ABNORMAL LOW (ref 98–111)
Creatinine, Ser: 1.13 mg/dL (ref 0.61–1.24)
GFR, Estimated: >60 mL/min (ref >60)
Glucose, Bld: 107 mg/dL — ABNORMAL HIGH (ref 70–99)
Potassium: 5 mmol/L (ref 3.5–5.1)
Sodium: 129 mmol/L — ABNORMAL LOW (ref 135–145)

## 2021-06-08 LAB — CBC
HCT: 35.6 % — ABNORMAL LOW (ref 39.0–52.0)
Hemoglobin: 12.3 g/dL — ABNORMAL LOW (ref 13.0–17.0)
MCH: 32.4 pg (ref 26.0–34.0)
MCHC: 34.6 g/dL (ref 30.0–36.0)
MCV: 93.7 fL (ref 80.0–100.0)
Platelets: 260 10*3/uL (ref 150–400)
RBC: 3.8 MIL/uL — ABNORMAL LOW (ref 4.22–5.81)
RDW: 13.6 % (ref 11.5–15.5)
WBC: 10.6 10*3/uL — ABNORMAL HIGH (ref 4.0–10.5)
nRBC: 0 % (ref 0.0–0.2)

## 2021-06-08 LAB — SURGICAL PCR SCREEN
MRSA, PCR: NEGATIVE
Staphylococcus aureus: NEGATIVE

## 2021-06-08 NOTE — Progress Notes (Addendum)
COVID test- 06/13/21 at 9:15 am   Bowel prep reminder:NA  PCP - Dr.   Celine Mans Cardiologist - Dr. Erlene Quan  Chest x-ray - no EKG - 04/07/21-[facility] Stress Test - 2021-[facility] ECHO - 08/16/20-[facility] Cardiac Cath - 2019 Pacemaker/ICD device last checked:03/29/21 Orders were requested and message was sent to scheduler that he couldn't be cleared for surgery until seen by provider. LOV was 12/2019  Sleep Study - no CPAP -   Fasting Blood Sugar - NA Checks Blood Sugar _____ times a day  Blood Thinner Instructions:ASA/ Dr. Erlene Quan Aspirin Instructions:none. I told Pt to call Dr.but he said he didn't want to stop the ASA Last Dose:  Anesthesia review: yes  Patient denies shortness of breath, fever, cough and chest pain at PAT appointment Pt is using a cane. He reports no SOB. He drinks a bottle of wine per night.  Patient verbalized understanding of instructions that were given to them at the PAT appointment. Patient was also instructed that they will need to review over the PAT instructions again at home before surgery. yes

## 2021-06-09 NOTE — Progress Notes (Signed)
Anesthesia Chart Review   Case: [id number] Date/Time: 06/15/21 0815   Procedure: TOTAL HIP ARTHROPLASTY ANTERIOR APPROACH (Right: Hip)   Anesthesia type: Spinal   Pre-op diagnosis: RIght hip osteoarthritis   Location: [facility] ROOM 07 / [facility]   Surgeons: Samson Frederic, MD       DISCUSSION:76 y.o. former smoker with h/o HTN, CAD, pacemaker in place, mild to moderate AV stenosis (mean gradient 17.0 mmHg, valve area 1.44 cm2), PAD, right hip OA scheduled for above procedure 06/15/21 with Dr. Samson Frederic.   Pt last seen by cardiology 04/07/22. Per OV note, "Mr. Lambright's perioperative risk of a major cardiac event is 6.6% according to the Revised Cardiac Risk Index (RCRI).  Therefore, he is at low risk for perioperative complications.   His functional capacity is good at 5.07 METs according to the Duke Activity Status Index (DASI). Recommendations: According to ACC/AHA guidelines, no further cardiovascular testing needed.  The patient may proceed to surgery at acceptable risk.   Antiplatelet and/or Anticoagulation Recommendations: Due to PAD and CAD ASA should not be held if possible. Can be held for a few days if deemed surgically necessary "  Anticipate pt can proceed with planned procedure barring acute status change.   VS: BP (!) 145/76    Pulse 98    Temp 36.7 C (Oral)    Resp 18    Ht 5\' 11"  (1.803 m)    Wt 92.5 kg    SpO2 97%    BMI 28.45 kg/m   PROVIDERS: de , [place], MD is PCP   Buren Kos, MD is Cardiologist  LABS: Labs reviewed: Acceptable for surgery. (all labs ordered are listed, but only abnormal results are displayed)  Labs Reviewed  CBC - Abnormal; Notable for the following components:      Result Value   WBC 10.6 (*)    RBC 3.80 (*)    Hemoglobin 12.3 (*)    HCT 35.6 (*)    All other components within normal limits  BASIC METABOLIC PANEL - Abnormal; Notable for the following components:   Sodium 129 (*)    Chloride 97 (*)    Glucose, Bld 107 (*)    All  other components within normal limits  SURGICAL PCR SCREEN  TYPE AND SCREEN     IMAGES:   EKG: 04/07/21 Rate 102 bpm  V-paced  CV: Echo 08/16/20  1. Moderate hypertrophy of the basal septum with otherwise mild,  concentric LVH. Septal dyskinesis consistent with left ventricular pacing.  Apical hypokinesis. Left ventricular ejection fraction, by estimation, is  55 to 60%. The left ventricle has  normal function. The left ventricle demonstrates regional wall motion  abnormalities (see scoring diagram/findings for description). There is  moderate asymmetric left ventricular hypertrophy of the basal-septal  segment. Left ventricular diastolic  parameters are consistent with Grade I diastolic dysfunction (impaired  relaxation).   2. Right ventricular systolic function is normal. The right ventricular  size is normal.   3. Left atrial size was severely dilated.   4. The mitral valve is normal in structure. No evidence of mitral valve  regurgitation. No evidence of mitral stenosis.   5. Visually, aortic stenosis appears severe. However, based on mean  gradient and dimensionless index it is mild-moderate. Aortic valve mean  gradient has increased minimally from 15 mmHg 08/2019 to 17 mmHg now. The  aortic valve is calcified. There is  severe calcifcation of the aortic valve. There is severe thickening of the  aortic valve.  Aortic valve regurgitation is not visualized. Mild to  moderate aortic valve stenosis.   6. Aortic dilatation noted. There is mild dilatation of the aortic root,  measuring 37 mm. There is mild dilatation of the ascending aorta,  measuring 38 mm.   7. The inferior vena cava is normal in size with greater than 50%  respiratory variability, suggesting right atrial pressure of 3 mmHg.  Myocardial Perfusion 09/09/2019 IMPRESSION: 1. No reversible ischemia or infarction.   2. Inferior wall hypokinesis.   3. Left ventricular ejection fraction 73%   4. Non invasive  risk stratification*: Low Past Medical History:  Diagnosis Date   Arthritis    CAD (coronary artery disease) 10/27/2018   Coronary artery disease    Heart murmur Since childhood   Hypertension    PAD (peripheral artery disease) (HCC) 10/27/2018   Presence of permanent cardiac pacemaker    Biotronik PPM    Past Surgical History:  Procedure Laterality Date   CARDIAC SURGERY  2019   with stents x2   HERNIA REPAIR  2012   PACEMAKER IMPLANT N/A 09/29/2019   Procedure: PACEMAKER IMPLANT;  Surgeon: Marinus Maw, MD;  Location: [facility];  Service: Cardiovascular;  Laterality: N/A;    MEDICATIONS:  acetaminophen (TYLENOL) 650 MG CR tablet   aspirin EC 81 MG tablet   atorvastatin (LIPITOR) 80 MG tablet   diphenhydrAMINE (BENADRYL) 25 MG tablet   metoprolol succinate (TOPROL XL) 25 MG 24 hr tablet   Multiple Vitamin (MULTIVITAMIN WITH MINERALS) TABS tablet   nitroGLYCERIN (NITROSTAT) 0.4 MG SL tablet   ramipril (ALTACE) 10 MG capsule   sodium chloride (OCEAN) 0.65 % SOLN nasal spray   No current facility-administered medications for this encounter.    Jodell Cipro Ward, PA-C [facility]Surgical Testing [phone]

## 2021-06-09 NOTE — Anesthesia Preprocedure Evaluation (Addendum)
Anesthesia Evaluation  Patient identified by MRN, date of birth, ID band Patient awake    Reviewed: Allergy & Precautions, NPO status , Patient's Chart, lab work & pertinent test results  Airway Mallampati: I       Dental  (+) Upper Dentures, Lower Dentures, Dental Advisory Given   Pulmonary former smoker,    breath sounds clear to auscultation       Cardiovascular Exercise Tolerance: Good METS: 5 - 7 Mets hypertension, Pt. on home beta blockers and Pt. on medications + CAD, + Cardiac Stents and + CABG (S/P 2019)  + pacemaker + Valvular Problems/Murmurs (mils to mod) AS  Rhythm:Regular Rate:Normal     Neuro/Psych negative neurological ROS  negative psych ROS   GI/Hepatic Neg liver ROS,   Endo/Other  negative endocrine ROS  Renal/GU Lab Results      Component                Value               Date                      CREATININE               1.13                06/08/2021                BUN                      17                  06/08/2021                NA                       129 (L)             06/08/2021                K                        5.0                 06/08/2021                CL                       97 (L)              06/08/2021                        Musculoskeletal  (+) Arthritis ,   Abdominal   Peds  Hematology Lab Results      Component                Value               Date                      WBC                      10.6 (H)            06/08/2021                HGB  12.3 (L)            06/08/2021                HCT                      35.6 (L)            06/08/2021                PLT                      260                 06/08/2021              Anesthesia Other Findings NKDA  Reproductive/Obstetrics                           Anesthesia Physical Anesthesia Plan  ASA: 3  Anesthesia Plan: Spinal   Post-op Pain Management:  Regional block* and Minimal or no pain anticipated   Induction:   PONV Risk Score and Plan: 2 and Treatment may vary due to age or medical condition and Ondansetron  Airway Management Planned: Natural Airway and Nasal Cannula  Additional Equipment: None  Intra-op Plan:   Post-operative Plan:   Informed Consent: I have reviewed the patients History and Physical, chart, labs and discussed the procedure including the risks, benefits and alternatives for the proposed anesthesia with the patient or authorized representative who has indicated his/her understanding and acceptance.     Dental advisory given  Plan Discussed with:   Anesthesia Plan Comments: (See PAT note 06/08/21, Jodell Cipro Ward, PA-C  Plan spinal have phenylephrine available)      Anesthesia Quick Evaluation

## 2021-06-13 ENCOUNTER — Encounter (HOSPITAL_COMMUNITY)
Admission: RE | Admit: 2021-06-13 | Discharge: 2021-06-13 | Disposition: A | Discharge status: Home / Self Care | Payer: Medicare PPO | Source: Ambulatory Visit | Attending: Orthopedic Surgery | Admitting: Orthopedic Surgery

## 2021-06-13 ENCOUNTER — Ambulatory Visit: Payer: Self-pay | Admitting: Orthopedic Surgery

## 2021-06-13 ENCOUNTER — Other Ambulatory Visit: Payer: Self-pay

## 2021-06-13 DIAGNOSIS — Z01818 Encounter for other preprocedural examination: Secondary | ICD-10-CM

## 2021-06-13 DIAGNOSIS — Z20822 Contact with and (suspected) exposure to covid-19: Secondary | ICD-10-CM | POA: Insufficient documentation

## 2021-06-13 DIAGNOSIS — Z01812 Encounter for preprocedural laboratory examination: Secondary | ICD-10-CM | POA: Diagnosis not present

## 2021-06-14 ENCOUNTER — Ambulatory Visit (HOSPITAL_BASED_OUTPATIENT_CLINIC_OR_DEPARTMENT_OTHER): Payer: Medicare PPO | Admitting: Family Medicine

## 2021-06-14 LAB — SARS CORONAVIRUS 2 (TAT 6-24 HRS): SARS Coronavirus 2: NEGATIVE

## 2021-06-14 NOTE — Progress Notes (Signed)
Dr Mal Amabile with anesthesia reviewed pts chart secondary to cardiac history. Ok with pts cardiac clearance.

## 2021-06-15 ENCOUNTER — Other Ambulatory Visit: Payer: Self-pay

## 2021-06-15 ENCOUNTER — Ambulatory Visit (HOSPITAL_COMMUNITY): Payer: Medicare PPO

## 2021-06-15 ENCOUNTER — Observation Stay (HOSPITAL_COMMUNITY): Payer: Medicare PPO

## 2021-06-15 ENCOUNTER — Ambulatory Visit (HOSPITAL_COMMUNITY): Payer: Medicare PPO | Admitting: Physician Assistant

## 2021-06-15 ENCOUNTER — Observation Stay (HOSPITAL_COMMUNITY)
Admission: RE | Admit: 2021-06-15 | Discharge: 2021-06-17 | Disposition: A | Discharge status: Home / Self Care | Payer: Medicare PPO | Source: Ambulatory Visit | Attending: Orthopedic Surgery | Admitting: Orthopedic Surgery

## 2021-06-15 ENCOUNTER — Encounter (HOSPITAL_COMMUNITY): Payer: Self-pay | Admitting: Orthopedic Surgery

## 2021-06-15 ENCOUNTER — Encounter (HOSPITAL_COMMUNITY)
Admission: RE | Disposition: A | Discharge status: Home / Self Care | Payer: Self-pay | Source: Ambulatory Visit | Attending: Orthopedic Surgery

## 2021-06-15 ENCOUNTER — Ambulatory Visit (HOSPITAL_BASED_OUTPATIENT_CLINIC_OR_DEPARTMENT_OTHER): Payer: Medicare PPO | Admitting: Anesthesiology

## 2021-06-15 DIAGNOSIS — Z471 Aftercare following joint replacement surgery: Secondary | ICD-10-CM | POA: Diagnosis not present

## 2021-06-15 DIAGNOSIS — M1611 Unilateral primary osteoarthritis, right hip: Secondary | ICD-10-CM

## 2021-06-15 DIAGNOSIS — Z09 Encounter for follow-up examination after completed treatment for conditions other than malignant neoplasm: Secondary | ICD-10-CM

## 2021-06-15 DIAGNOSIS — Z79899 Other long term (current) drug therapy: Secondary | ICD-10-CM | POA: Insufficient documentation

## 2021-06-15 DIAGNOSIS — Z955 Presence of coronary angioplasty implant and graft: Secondary | ICD-10-CM | POA: Insufficient documentation

## 2021-06-15 DIAGNOSIS — Z87891 Personal history of nicotine dependence: Secondary | ICD-10-CM | POA: Insufficient documentation

## 2021-06-15 DIAGNOSIS — I350 Nonrheumatic aortic (valve) stenosis: Secondary | ICD-10-CM

## 2021-06-15 DIAGNOSIS — I2510 Atherosclerotic heart disease of native coronary artery without angina pectoris: Secondary | ICD-10-CM | POA: Insufficient documentation

## 2021-06-15 DIAGNOSIS — I10 Essential (primary) hypertension: Secondary | ICD-10-CM | POA: Diagnosis not present

## 2021-06-15 DIAGNOSIS — Z950 Presence of cardiac pacemaker: Secondary | ICD-10-CM | POA: Insufficient documentation

## 2021-06-15 DIAGNOSIS — Z7982 Long term (current) use of aspirin: Secondary | ICD-10-CM | POA: Diagnosis not present

## 2021-06-15 DIAGNOSIS — Z419 Encounter for procedure for purposes other than remedying health state, unspecified: Secondary | ICD-10-CM

## 2021-06-15 DIAGNOSIS — Z96641 Presence of right artificial hip joint: Secondary | ICD-10-CM | POA: Diagnosis not present

## 2021-06-15 HISTORY — PX: TOTAL HIP ARTHROPLASTY: SHX124

## 2021-06-15 LAB — TYPE AND SCREEN
ABO/RH(D): A POS
Antibody Screen: NEGATIVE

## 2021-06-15 LAB — ABO/RH: ABO/RH(D): A POS

## 2021-06-15 SURGERY — ARTHROPLASTY, HIP, TOTAL, ANTERIOR APPROACH
Anesthesia: Spinal | Site: Hip | Laterality: Right

## 2021-06-15 MED ORDER — DIPHENHYDRAMINE HCL 12.5 MG/5ML PO ELIX
12.5000 mg | ORAL_SOLUTION | ORAL | Status: DC | PRN
  Administered 2021-06-16: 25 mg via ORAL
  Filled 2021-06-15: qty 10

## 2021-06-15 MED ORDER — SODIUM CHLORIDE 0.9 % IV SOLN: INTRAVENOUS | Status: DC

## 2021-06-15 MED ORDER — KETOROLAC TROMETHAMINE 30 MG/ML IJ SOLN
INTRAMUSCULAR | Status: AC
  Filled 2021-06-15: qty 1

## 2021-06-15 MED ORDER — METHOCARBAMOL 500 MG PO TABS
500.0000 mg | ORAL_TABLET | Freq: Four times a day (QID) | ORAL | Status: DC | PRN
  Administered 2021-06-15: 500 mg via ORAL
  Filled 2021-06-15 (×2): qty 1

## 2021-06-15 MED ORDER — PROPOFOL 10 MG/ML IV BOLUS
INTRAVENOUS | Status: DC | PRN
  Administered 2021-06-15: 20 mg via INTRAVENOUS
  Administered 2021-06-15 (×2): 10 mg via INTRAVENOUS
  Administered 2021-06-15: 20 mg via INTRAVENOUS
  Administered 2021-06-15: 10 mg via INTRAVENOUS

## 2021-06-15 MED ORDER — PROPOFOL 1000 MG/100ML IV EMUL
INTRAVENOUS | Status: AC
  Filled 2021-06-15: qty 100

## 2021-06-15 MED ORDER — PHENYLEPHRINE HCL (PRESSORS) 10 MG/ML IV SOLN
INTRAVENOUS | Status: AC
  Filled 2021-06-15: qty 1

## 2021-06-15 MED ORDER — ATORVASTATIN CALCIUM 40 MG PO TABS
80.0000 mg | ORAL_TABLET | Freq: Every day | ORAL | Status: DC
  Administered 2021-06-16 – 2021-06-17 (×2): 80 mg via ORAL
  Filled 2021-06-15 (×2): qty 2

## 2021-06-15 MED ORDER — ONDANSETRON HCL 4 MG/2ML IJ SOLN
INTRAMUSCULAR | Status: DC | PRN
  Administered 2021-06-15: 4 mg via INTRAVENOUS

## 2021-06-15 MED ORDER — ONDANSETRON HCL 4 MG PO TABS: 4.0000 mg | ORAL_TABLET | Freq: Four times a day (QID) | ORAL | Status: DC | PRN

## 2021-06-15 MED ORDER — SENNA 8.6 MG PO TABS
1.0000 | ORAL_TABLET | Freq: Two times a day (BID) | ORAL | Status: DC
  Administered 2021-06-15 – 2021-06-17 (×4): 8.6 mg via ORAL
  Filled 2021-06-15 (×4): qty 1

## 2021-06-15 MED ORDER — CHLORHEXIDINE GLUCONATE 0.12 % MT SOLN
15.0000 mL | Freq: Once | OROMUCOSAL | Status: AC
  Administered 2021-06-15: 15 mL via OROMUCOSAL

## 2021-06-15 MED ORDER — PHENYLEPHRINE HCL-NACL 20-0.9 MG/250ML-% IV SOLN
INTRAVENOUS | Status: DC | PRN
  Administered 2021-06-15: 25 ug/min via INTRAVENOUS

## 2021-06-15 MED ORDER — CEFAZOLIN SODIUM-DEXTROSE 2-4 GM/100ML-% IV SOLN
2.0000 g | INTRAVENOUS | Status: AC
  Administered 2021-06-15: 2 g via INTRAVENOUS
  Filled 2021-06-15: qty 100

## 2021-06-15 MED ORDER — KETOROLAC TROMETHAMINE 30 MG/ML IJ SOLN
INTRAMUSCULAR | Status: DC | PRN
  Administered 2021-06-15: 30 mg

## 2021-06-15 MED ORDER — FENTANYL CITRATE (PF) 100 MCG/2ML IJ SOLN
INTRAMUSCULAR | Status: AC
  Filled 2021-06-15: qty 2

## 2021-06-15 MED ORDER — SODIUM CHLORIDE 0.9 % IR SOLN
Status: DC | PRN
  Administered 2021-06-15 (×3): 1000 mL

## 2021-06-15 MED ORDER — MENTHOL 3 MG MT LOZG
1.0000 | LOZENGE | OROMUCOSAL | Status: DC | PRN
  Filled 2021-06-15: qty 9

## 2021-06-15 MED ORDER — LIDOCAINE HCL (CARDIAC) PF 100 MG/5ML IV SOSY
PREFILLED_SYRINGE | INTRAVENOUS | Status: DC | PRN
  Administered 2021-06-15: 40 mg via INTRAVENOUS

## 2021-06-15 MED ORDER — BUPIVACAINE-EPINEPHRINE 0.25% -1:200000 IJ SOLN
INTRAMUSCULAR | Status: DC | PRN
  Administered 2021-06-15: 30 mL

## 2021-06-15 MED ORDER — PHENYLEPHRINE 40 MCG/ML (10ML) SYRINGE FOR IV PUSH (FOR BLOOD PRESSURE SUPPORT)
PREFILLED_SYRINGE | INTRAVENOUS | Status: DC | PRN
  Administered 2021-06-15: 120 ug via INTRAVENOUS

## 2021-06-15 MED ORDER — PROPOFOL 10 MG/ML IV BOLUS
INTRAVENOUS | Status: AC
  Filled 2021-06-15: qty 20

## 2021-06-15 MED ORDER — ALBUMIN HUMAN 5 % IV SOLN
INTRAVENOUS | Status: AC
  Filled 2021-06-15: qty 250

## 2021-06-15 MED ORDER — ALBUMIN HUMAN 5 % IV SOLN
12.5000 g | Freq: Once | INTRAVENOUS | Status: AC
Start: 2021-06-15 — End: 2021-06-15
  Administered 2021-06-15: 11:00:00 12.5 g via INTRAVENOUS

## 2021-06-15 MED ORDER — ASPIRIN 81 MG PO CHEW
81.0000 mg | CHEWABLE_TABLET | Freq: Two times a day (BID) | ORAL | Status: DC
  Administered 2021-06-15 – 2021-06-17 (×4): 81 mg via ORAL
  Filled 2021-06-15 (×4): qty 1

## 2021-06-15 MED ORDER — HYDROCODONE-ACETAMINOPHEN 5-325 MG PO TABS
1.0000 | ORAL_TABLET | ORAL | Status: DC | PRN
  Administered 2021-06-15: 1 via ORAL
  Administered 2021-06-17: 2 via ORAL
  Filled 2021-06-15: qty 2
  Filled 2021-06-15: qty 1

## 2021-06-15 MED ORDER — BUPIVACAINE-EPINEPHRINE 0.25% -1:200000 IJ SOLN
INTRAMUSCULAR | Status: AC
  Filled 2021-06-15: qty 1

## 2021-06-15 MED ORDER — KETOROLAC TROMETHAMINE 15 MG/ML IJ SOLN
7.5000 mg | Freq: Four times a day (QID) | INTRAMUSCULAR | Status: AC
  Administered 2021-06-15 – 2021-06-16 (×3): 7.5 mg via INTRAVENOUS
  Filled 2021-06-15 (×4): qty 1

## 2021-06-15 MED ORDER — MORPHINE SULFATE (PF) 2 MG/ML IV SOLN: 0.5000 mg | INTRAVENOUS | Status: DC | PRN

## 2021-06-15 MED ORDER — SODIUM CHLORIDE (PF) 0.9 % IJ SOLN
INTRAMUSCULAR | Status: AC
  Filled 2021-06-15: qty 50

## 2021-06-15 MED ORDER — METOCLOPRAMIDE HCL 5 MG PO TABS: 5.0000 mg | ORAL_TABLET | Freq: Three times a day (TID) | ORAL | Status: DC | PRN

## 2021-06-15 MED ORDER — METHOCARBAMOL 500 MG IVPB - SIMPLE MED
500.0000 mg | Freq: Four times a day (QID) | INTRAVENOUS | Status: DC | PRN
  Filled 2021-06-15: qty 50

## 2021-06-15 MED ORDER — NITROGLYCERIN 0.4 MG SL SUBL: 0.4000 mg | SUBLINGUAL_TABLET | SUBLINGUAL | Status: DC | PRN

## 2021-06-15 MED ORDER — LACTATED RINGERS IV SOLN: INTRAVENOUS | Status: DC

## 2021-06-15 MED ORDER — SALINE SPRAY 0.65 % NA SOLN
1.0000 | NASAL | Status: DC | PRN
  Filled 2021-06-15: qty 44

## 2021-06-15 MED ORDER — DOCUSATE SODIUM 100 MG PO CAPS
100.0000 mg | ORAL_CAPSULE | Freq: Two times a day (BID) | ORAL | Status: DC
  Administered 2021-06-15 – 2021-06-17 (×4): 100 mg via ORAL
  Filled 2021-06-15 (×4): qty 1

## 2021-06-15 MED ORDER — ISOPROPYL ALCOHOL 70 % SOLN
Status: DC | PRN
Start: 2021-06-15 — End: 2021-06-15
  Administered 2021-06-15: 1 via TOPICAL

## 2021-06-15 MED ORDER — PHENOL 1.4 % MT LIQD
1.0000 | OROMUCOSAL | Status: DC | PRN
  Administered 2021-06-16: 1 via OROMUCOSAL
  Filled 2021-06-15: qty 177

## 2021-06-15 MED ORDER — POVIDONE-IODINE 10 % EX SWAB: 2.0000 "application " | Freq: Once | CUTANEOUS | Status: DC

## 2021-06-15 MED ORDER — BUPIVACAINE-EPINEPHRINE (PF) 0.25% -1:200000 IJ SOLN
INTRAMUSCULAR | Status: AC
  Filled 2021-06-15: qty 30

## 2021-06-15 MED ORDER — DEXAMETHASONE SODIUM PHOSPHATE 10 MG/ML IJ SOLN
INTRAMUSCULAR | Status: DC | PRN
  Administered 2021-06-15: 8 mg via INTRAVENOUS

## 2021-06-15 MED ORDER — HYDROCODONE-ACETAMINOPHEN 7.5-325 MG PO TABS
1.0000 | ORAL_TABLET | ORAL | Status: DC | PRN
  Administered 2021-06-16: 1 via ORAL
  Filled 2021-06-15: qty 1

## 2021-06-15 MED ORDER — METOCLOPRAMIDE HCL 5 MG/ML IJ SOLN: 5.0000 mg | Freq: Three times a day (TID) | INTRAMUSCULAR | Status: DC | PRN

## 2021-06-15 MED ORDER — ALUM & MAG HYDROXIDE-SIMETH 200-200-20 MG/5ML PO SUSP: 30.0000 mL | ORAL | Status: DC | PRN

## 2021-06-15 MED ORDER — SODIUM CHLORIDE (PF) 0.9 % IJ SOLN
INTRAMUSCULAR | Status: DC | PRN
  Administered 2021-06-15: 30 mL

## 2021-06-15 MED ORDER — ONDANSETRON HCL 4 MG/2ML IJ SOLN: 4.0000 mg | Freq: Four times a day (QID) | INTRAMUSCULAR | Status: DC | PRN

## 2021-06-15 MED ORDER — FENTANYL CITRATE (PF) 100 MCG/2ML IJ SOLN
INTRAMUSCULAR | Status: DC | PRN
Start: 2021-06-15 — End: 2021-06-15
  Administered 2021-06-15: 100 ug via INTRAVENOUS

## 2021-06-15 MED ORDER — CEFAZOLIN SODIUM-DEXTROSE 2-4 GM/100ML-% IV SOLN
2.0000 g | Freq: Four times a day (QID) | INTRAVENOUS | Status: AC
  Administered 2021-06-15 (×2): 2 g via INTRAVENOUS
  Filled 2021-06-15 (×2): qty 100

## 2021-06-15 MED ORDER — ONDANSETRON HCL 4 MG/2ML IJ SOLN: 4.0000 mg | Freq: Once | INTRAMUSCULAR | Status: DC | PRN

## 2021-06-15 MED ORDER — ACETAMINOPHEN 500 MG PO TABS
1000.0000 mg | ORAL_TABLET | Freq: Once | ORAL | Status: AC
  Administered 2021-06-15: 1000 mg via ORAL
  Filled 2021-06-15: qty 2

## 2021-06-15 MED ORDER — WATER FOR IRRIGATION, STERILE IR SOLN
Status: DC | PRN
  Administered 2021-06-15: 1000 mL

## 2021-06-15 MED ORDER — PROPOFOL 500 MG/50ML IV EMUL
INTRAVENOUS | Status: DC | PRN
  Administered 2021-06-15: 80 ug/kg/min via INTRAVENOUS

## 2021-06-15 MED ORDER — ACETAMINOPHEN 10 MG/ML IV SOLN: 1000.0000 mg | Freq: Once | INTRAVENOUS | Status: DC | PRN

## 2021-06-15 MED ORDER — METOPROLOL SUCCINATE ER 25 MG PO TB24
25.0000 mg | ORAL_TABLET | Freq: Every day | ORAL | Status: DC
  Administered 2021-06-17: 25 mg via ORAL
  Filled 2021-06-15 (×2): qty 1

## 2021-06-15 MED ORDER — ORAL CARE MOUTH RINSE: 15.0000 mL | Freq: Once | OROMUCOSAL | Status: AC

## 2021-06-15 MED ORDER — FENTANYL CITRATE PF 50 MCG/ML IJ SOSY: 25.0000 ug | PREFILLED_SYRINGE | INTRAMUSCULAR | Status: DC | PRN

## 2021-06-15 MED ORDER — LIDOCAINE HCL (PF) 2 % IJ SOLN
INTRAMUSCULAR | Status: AC
  Filled 2021-06-15: qty 5

## 2021-06-15 MED ORDER — POVIDONE-IODINE 10 % EX SWAB
2.0000 "application " | Freq: Once | CUTANEOUS | Status: AC
  Administered 2021-06-15: 2 via TOPICAL

## 2021-06-15 MED ORDER — ACETAMINOPHEN 325 MG PO TABS: 325.0000 mg | ORAL_TABLET | Freq: Four times a day (QID) | ORAL | Status: DC | PRN

## 2021-06-15 MED ORDER — POLYETHYLENE GLYCOL 3350 17 G PO PACK: 17.0000 g | PACK | Freq: Every day | ORAL | Status: DC | PRN

## 2021-06-15 MED ORDER — TRANEXAMIC ACID-NACL 1000-0.7 MG/100ML-% IV SOLN
1000.0000 mg | INTRAVENOUS | Status: AC
  Administered 2021-06-15: 1000 mg via INTRAVENOUS
  Filled 2021-06-15: qty 100

## 2021-06-15 SURGICAL SUPPLY — 57 items
BAG COUNTER SPONGE SURGICOUNT (BAG) IMPLANT
BAG ZIPLOCK 12X15 (MISCELLANEOUS) IMPLANT
CHLORAPREP W/TINT 26 (MISCELLANEOUS) ×2 IMPLANT
COVER PERINEAL POST (MISCELLANEOUS) ×2 IMPLANT
COVER SURGICAL LIGHT HANDLE (MISCELLANEOUS) ×2 IMPLANT
DERMABOND ADVANCED (GAUZE/BANDAGES/DRESSINGS) ×2
DERMABOND ADVANCED .7 DNX12 (GAUZE/BANDAGES/DRESSINGS) ×2 IMPLANT
DRAPE IMP U-DRAPE 54X76 (DRAPES) ×2 IMPLANT
DRAPE SHEET LG 3/4 BI-LAMINATE (DRAPES) ×6 IMPLANT
DRAPE STERI IOBAN 125X83 (DRAPES) ×2 IMPLANT
DRAPE U-SHAPE 47X51 STRL (DRAPES) ×4 IMPLANT
DRSG AQUACEL AG ADV 3.5X10 (GAUZE/BANDAGES/DRESSINGS) ×2 IMPLANT
ELECT BLADE TIP CTD 4 INCH (ELECTRODE) ×1 IMPLANT
ELECT REM PT RETURN 15FT ADLT (MISCELLANEOUS) ×2 IMPLANT
GAUZE SPONGE 4X4 12PLY STRL (GAUZE/BANDAGES/DRESSINGS) ×2 IMPLANT
GLOVE SRG 8 PF TXTR STRL LF DI (GLOVE) ×1 IMPLANT
GLOVE SURG ENC MOIS LTX SZ8.5 (GLOVE) ×4 IMPLANT
GLOVE SURG ENC TEXT LTX SZ7.5 (GLOVE) ×4 IMPLANT
GLOVE SURG UNDER POLY LF SZ8 (GLOVE) ×1
GLOVE SURG UNDER POLY LF SZ8.5 (GLOVE) ×2 IMPLANT
GOWN SPEC L3 XXLG W/TWL (GOWN DISPOSABLE) ×2 IMPLANT
GOWN STRL REUS W/TWL XL LVL3 (GOWN DISPOSABLE) ×2 IMPLANT
HANDPIECE INTERPULSE COAX TIP (DISPOSABLE) ×1
HEAD CERAMIC BIOLOX 36 T1 STD (Head) ×1 IMPLANT
HOLDER FOLEY CATH W/STRAP (MISCELLANEOUS) ×2 IMPLANT
HOOD PEEL AWAY FLYTE STAYCOOL (MISCELLANEOUS) ×6 IMPLANT
KIT TURNOVER KIT A (KITS) IMPLANT
LINER ACETAB OFF G7 5 36 +5 (Liner) ×1 IMPLANT
MANIFOLD NEPTUNE II (INSTRUMENTS) ×2 IMPLANT
MARKER SKIN DUAL TIP RULER LAB (MISCELLANEOUS) ×2 IMPLANT
NDL SAFETY ECLIPSE 18X1.5 (NEEDLE) ×1 IMPLANT
NDL SPNL 18GX3.5 QUINCKE PK (NEEDLE) ×1 IMPLANT
NEEDLE HYPO 18GX1.5 SHARP (NEEDLE) ×1
NEEDLE SPNL 18GX3.5 QUINCKE PK (NEEDLE) ×2 IMPLANT
PACK ANTERIOR HIP CUSTOM (KITS) ×2 IMPLANT
PENCIL SMOKE EVACUATOR (MISCELLANEOUS) IMPLANT
RETRACTOR YANK SUCT EIGR SABER (INSTRUMENTS) ×1 IMPLANT
SAW OSC TIP CART 19.5X105X1.3 (SAW) ×2 IMPLANT
SEALER BIPOLAR AQUA 6.0 (INSTRUMENTS) ×2 IMPLANT
SET HNDPC FAN SPRY TIP SCT (DISPOSABLE) ×1 IMPLANT
SHELL ACET G7 4H 58 SZG HIP (Shell) ×1 IMPLANT
SOLUTION PRONTOSAN WOUND 350ML (IRRIGATION / IRRIGATOR) ×2 IMPLANT
SPIKE FLUID TRANSFER (MISCELLANEOUS) ×2 IMPLANT
SPONGE T-LAP 18X18 ~~LOC~~+RFID (SPONGE) ×5 IMPLANT
STAPLER INSORB 30 2030 C-SECTI (MISCELLANEOUS) IMPLANT
STEM FEM CMTLS 15X115 133D (Stem) ×1 IMPLANT
SUT MNCRL AB 3-0 PS2 18 (SUTURE) ×2 IMPLANT
SUT MON AB 2-0 CT1 36 (SUTURE) ×2 IMPLANT
SUT STRATAFIX PDO 1 14 VIOLET (SUTURE) ×1
SUT STRATFX PDO 1 14 VIOLET (SUTURE) ×1
SUT VIC AB 2-0 CT1 27 (SUTURE)
SUT VIC AB 2-0 CT1 TAPERPNT 27 (SUTURE) IMPLANT
SUTURE STRATFX PDO 1 14 VIOLET (SUTURE) ×1 IMPLANT
SYR 3ML LL SCALE MARK (SYRINGE) ×2 IMPLANT
TRAY FOLEY MTR SLVR 16FR STAT (SET/KITS/TRAYS/PACK) ×1 IMPLANT
TUBE SUCTION HIGH CAP CLEAR NV (SUCTIONS) ×2 IMPLANT
WATER STERILE IRR 1000ML POUR (IV SOLUTION) ×2 IMPLANT

## 2021-06-15 NOTE — Discharge Instructions (Signed)
? ?Dr. Marie Chow ?Joint Replacement Specialist ?[facility] ?[address] ?[place], [place] [address] ?[phone] ? ? ?TOTAL HIP REPLACEMENT POSTOPERATIVE DIRECTIONS ? ? ? ?Hip Rehabilitation, Guidelines Following Surgery  ? ?WEIGHT BEARING ?Weight bearing as tolerated with assist device (walker, cane, etc) as directed, use it as long as suggested by your surgeon or therapist, typically at least 4-6 weeks. ? ?The results of a hip operation are greatly improved after range of motion and muscle strengthening exercises. Follow all safety measures which are given to protect your hip. If any of these exercises cause increased pain or swelling in your joint, decrease the amount until you are comfortable again. Then slowly increase the exercises. Call your caregiver if you have problems or questions.  ? ?HOME CARE INSTRUCTIONS  ?Most of the following instructions are designed to prevent the dislocation of your new hip.  ?Remove items at home which could result in a fall. This includes throw rugs or furniture in walking pathways.  ?Continue medications as instructed at time of discharge. ?You may have some home medications which will be placed on hold until you complete the course of blood thinner medication. ?You may start showering once you are discharged home. Do not remove your dressing. ?Do not put on socks or shoes without following the instructions of your caregivers.   ?Sit on chairs with arms. Use the chair arms to help push yourself up when arising.  ?Arrange for the use of a toilet seat elevator so you are not sitting low.  ?Walk with walker as instructed.  ?You may resume a sexual relationship in one month or when given the OK by your caregiver.  ?Use walker as long as suggested by your caregivers.  ?You may put full weight on your legs and walk as much as is comfortable. ?Avoid periods of inactivity such as sitting longer than an hour when not asleep. This helps prevent blood  clots.  ?You may return to work once you are cleared by your surgeon.  ?Do not drive a car for 6 weeks or until released by your surgeon.  ?Do not drive while taking narcotics.  ?Wear elastic stockings for two weeks following surgery during the day but you may remove then at night.  ?Make sure you keep all of your appointments after your operation with all of your doctors and caregivers. You should call the office at the above phone number and make an appointment for approximately two weeks after the date of your surgery. ?Please pick up a stool softener and laxative for home use as long as you are requiring pain medications. ?ICE to the affected hip every three hours for 30 minutes at a time and then as needed for pain and swelling. Continue to use ice on the hip for pain and swelling from surgery. You may notice swelling that will progress down to the foot and ankle.  This is normal after surgery.  Elevate the leg when you are not up walking on it.   ?It is important for you to complete the blood thinner medication as prescribed by your doctor. ?Continue to use the breathing machine which will help keep your temperature down.  It is common for your temperature to cycle up and down following surgery, especially at night when you are not up moving around and exerting yourself.  The breathing machine keeps your lungs expanded and your temperature down. ? ?RANGE OF MOTION AND STRENGTHENING EXERCISES  ?These exercises are designed to help you   keep full movement of your hip joint. Follow your caregiver's or physical therapist's instructions. Perform all exercises about fifteen times, three times per day or as directed. Exercise both hips, even if you have had only one joint replacement. These exercises can be done on a training (exercise) mat, on the floor, on a table or on a bed. Use whatever works the best and is most comfortable for you. Use music or television while you are exercising so that the exercises are a  pleasant break in your day. This will make your life better with the exercises acting as a break in routine you can look forward to.  ?Lying on your back, slowly slide your foot toward your buttocks, raising your knee up off the floor. Then slowly slide your foot back down until your leg is straight again.  ?Lying on your back spread your legs as far apart as you can without causing discomfort.  ?Lying on your side, raise your upper leg and foot straight up from the floor as far as is comfortable. Slowly lower the leg and repeat.  ?Lying on your back, tighten up the muscle in the front of your thigh (quadriceps muscles). You can do this by keeping your leg straight and trying to raise your heel off the floor. This helps strengthen the largest muscle supporting your knee.  ?Lying on your back, tighten up the muscles of your buttocks both with the legs straight and with the knee bent at a comfortable angle while keeping your heel on the floor.  ? ?SKILLED REHAB INSTRUCTIONS: ?If the patient is transferred to a skilled rehab facility following release from the hospital, a list of the current medications will be sent to the facility for the patient to continue.  When discharged from the skilled rehab facility, please have the facility set up the patient's Home Health Physical Therapy prior to being released. Also, the skilled facility will be responsible for providing the patient with their medications at time of release from the facility to include their pain medication and their blood thinner medication. If the patient is still at the rehab facility at time of the two week follow up appointment, the skilled rehab facility will also need to assist the patient in arranging follow up appointment in our office and any transportation needs. ? ?POST-OPERATIVE OPIOID TAPER INSTRUCTIONS: ?It is important to wean off of your opioid medication as soon as possible. If you do not need pain medication after your surgery it is ok  to stop day one. ?Opioids include: ?Codeine, Hydrocodone(Norco, Vicodin), Oxycodone(Percocet, oxycontin) and hydromorphone amongst others.  ?Long term and even short term use of opiods can cause: ?Increased pain response ?Dependence ?Constipation ?Depression ?Respiratory depression ?And more.  ?Withdrawal symptoms can include ?Flu like symptoms ?Nausea, vomiting ?And more ?Techniques to manage these symptoms ?Hydrate well ?Eat regular healthy meals ?Stay active ?Use relaxation techniques(deep breathing, meditating, yoga) ?Do Not substitute Alcohol to help with tapering ?If you have been on opioids for less than two weeks and do not have pain than it is ok to stop all together.  ?Plan to wean off of opioids ?This plan should start within one week post op of your joint replacement. ?Maintain the same interval or time between taking each dose and first decrease the dose.  ?Cut the total daily intake of opioids by one tablet each day ?Next start to increase the time between doses. ?The last dose that should be eliminated is the evening dose.  ? ? ?MAKE   SURE YOU:  ?Understand these instructions.  ?Will watch your condition.  ?Will get help right away if you are not doing well or get worse. ? ?Pick up stool softner and laxative for home use following surgery while on pain medications. ?Do not remove your dressing. ?The dressing is waterproof--it is OK to take showers. ?Continue to use ice for pain and swelling after surgery. ?Do not use any lotions or creams on the incision until instructed by your surgeon. ?Total Hip Protocol. ? ?

## 2021-06-15 NOTE — Evaluation (Signed)
Physical Therapy Evaluation ?Patient Details ?Name: Glenn Collins ?MRN: [id number] ?DOB: 1945-05-27 ?Today's Date: 06/15/2021 ? ?History of Present Illness ? 76 yo male s/p R DA THA on 06/15/21 PMH: NSTEMI, CAD, HTN, pacemaker  ?Clinical Impression ? Pt is s/p THA resulting in the deficits listed below (see PT Problem List).  ?Pt able to stand and transfer to recliner, assisted descent to chair by PT d/t pt with increasing lightheadedness and brief difficulty following commands, near LOC with BP 74/60 after fully reclined in chair. HR 100, SpO2= 97% on RA. RN notified ? ? Pt will benefit from skilled PT to increase their independence and safety with mobility to allow discharge to the venue listed below.  ?   ?   ? ?Recommendations for follow up therapy are one component of a multi-disciplinary discharge planning process, led by the attending physician.  Recommendations may be updated based on patient status, additional functional criteria and insurance authorization. ? ?Follow Up Recommendations Follow physician's recommendations for discharge plan and follow up therapies (HEP) ? ?  ?Assistance Recommended at Discharge Intermittent Supervision/Assistance  ?Patient can return home with the following ? A little help with walking and/or transfers;Help with stairs or ramp for entrance;Assist for transportation;Assistance with cooking/housework ? ?  ?Equipment Recommendations Rolling walker (2 wheels)  ?Recommendations for Other Services ?    ?  ?Functional Status Assessment Patient has had a recent decline in their functional status and demonstrates the ability to make significant improvements in function in a reasonable and predictable amount of time.  ? ?  ?Precautions / Restrictions Precautions ?Precautions: Fall ?Restrictions ?Weight Bearing Restrictions: No ?Other Position/Activity Restrictions: WBAT  ? ?  ? ?Mobility ? Bed Mobility ?Overal bed mobility: Needs Assistance ?Bed Mobility: Supine to Sit ?  ?  ?Supine to  sit: Min assist ?  ?  ?General bed mobility comments: assist with R LE ?  ? ?Transfers ?Overall transfer level: Needs assistance ?Equipment used: Rolling walker (2 wheels) ?Transfers: Sit to/from Stand, Bed to chair/wheelchair/BSC ?Sit to Stand: Min assist, Mod assist ?  ?Step pivot transfers: Min assist ?  ?  ?  ?General transfer comment: cues for hand placement-pt letting go of walker, had  incr lightheadedness and became more unsteady, PT assisted descent to chair; min assist to balance for stand step pivot to RW ?  ? ?Ambulation/Gait ?  ?  ?  ?  ?  ?  ?  ?General Gait Details: deferred d/t lightheadedness ? ?Stairs ?  ?  ?  ?  ?  ? ?Wheelchair Mobility ?  ? ?Modified Rankin (Stroke Patients Only) ?  ? ?  ? ?Balance Overall balance assessment: Needs assistance ?  ?Sitting balance-Leahy Scale: Fair ?  ?  ?Standing balance support: Reliant on assistive device for balance, During functional activity ?Standing balance-Leahy Scale: Poor ?Standing balance comment: wide BOS, reliant on external assist for static standing ?  ?  ?  ?  ?  ?  ?  ?  ?  ?  ?  ?   ? ? ? ?Pertinent Vitals/Pain Pain Assessment ?Pain Assessment: 0-10 ?Pain Score: 3  ?Pain Location: right hip ?Pain Descriptors / Indicators: Aching, Sore ?Pain Intervention(s): Limited activity within patient's tolerance, Monitored during session, Premedicated before session, Repositioned  ? ? ?Home Living Family/patient expects to be discharged to:: Private residence ?Living Arrangements: Spouse/significant other ?Available Help at Discharge: Family;Available 24 hours/day ?  ?Home Access: Stairs to enter ?Entrance Stairs-Rails: Right;Left;Can reach both ?Entrance Stairs-Number of Steps: 2 ?  ?  Home Layout: Multi-level;Able to live on main level with bedroom/bathroom ?Home Equipment: Rollator (4 wheels) ?   ?  ?Prior Function Prior Level of Function : Independent/Modified Independent ?  ?  ?  ?  ?  ?  ?Mobility Comments: rollator or cane ?  ?  ? ? ?Hand Dominance  ?    ? ?  ?Extremity/Trunk Assessment  ? Upper Extremity Assessment ?Upper Extremity Assessment: Overall WFL for tasks assessed ?  ? ?Lower Extremity Assessment ?Lower Extremity Assessment: RLE deficits/detail ?RLE Deficits / Details: ankle WFL, knee and hip grossly 2+/5, limited by post op pain ?  ? ?   ?Communication  ? Communication: No difficulties  ?Cognition Arousal/Alertness: Awake/alert ?Behavior During Therapy: [facility] for tasks assessed/performed ?Overall Cognitive Status: Within Functional Limits for tasks assessed ?  ?  ?  ?  ?  ?  ?  ?  ?  ?  ?  ?  ?  ?  ?  ?  ?  ?  ?  ? ?  ?General Comments   ? ?  ?Exercises Total Joint Exercises ?Ankle Circles/Pumps: AROM, Both, 10 reps  ? ?Assessment/Plan  ?  ?PT Assessment Patient needs continued PT services  ?PT Problem List Decreased mobility;Decreased strength;Decreased activity tolerance;Pain;Decreased knowledge of use of DME;Decreased balance ? ?   ?  ?PT Treatment Interventions DME instruction;Therapeutic exercise;Gait training;Stair training;Functional mobility training;Therapeutic activities;Patient/family education   ? ?PT Goals (Current goals can be found in the Care Plan section)  ?Acute Rehab PT Goals ?Patient Stated Goal: have less hip pain ?PT Goal Formulation: With patient ?Time For Goal Achievement: 06/22/21 ?Potential to Achieve Goals: Good ? ?  ?Frequency 7X/week ?  ? ? ?Co-evaluation   ?  ?  ?  ?  ? ? ?  ?AM-PAC PT "6 Clicks" Mobility  ?Outcome Measure Help needed turning from your back to your side while in a flat bed without using bedrails?: A Little ?Help needed moving from lying on your back to sitting on the side of a flat bed without using bedrails?: A Little ?Help needed moving to and from a bed to a chair (including a wheelchair)?: A Little ?Help needed standing up from a chair using your arms (e.g., wheelchair or bedside chair)?: Total ?Help needed to walk in hospital room?: Total ?Help needed climbing 3-5 steps with a railing? : Total ?6 Click  Score: 12 ? ?  ?End of Session Equipment Utilized During Treatment: Gait belt ?Activity Tolerance: Patient tolerated treatment well ?Patient left: with call bell/phone within reach;in chair;with chair alarm set;with family/visitor present;with nursing/sitter in room ?Nurse Communication: Mobility status ?PT Visit Diagnosis: Other abnormalities of gait and mobility (R26.89);Difficulty in walking, not elsewhere classified (R26.2) ?  ? ?Time: 1093-2355 ?PT Time Calculation (min) (ACUTE ONLY): 26 min ? ? ?Charges:   PT Evaluation ?$PT Eval Low Complexity: 1 Low ?PT Treatments ?$Therapeutic Activity: 8-22 mins ?  ?   ? ? ?Delice Bison, PT ? ?Acute Rehab Dept [facility]) [phone] ?Pager [phone] ? ?06/15/2021 ? ? ?Tiney Zipper ?06/15/2021, 5:28 PM ? ?

## 2021-06-15 NOTE — Anesthesia Postprocedure Evaluation (Signed)
Anesthesia Post Note ? ?Patient: Glenn Collins ? ?Procedure(s) Performed: TOTAL HIP ARTHROPLASTY ANTERIOR APPROACH (Right: Hip) ? ?  ? ?Patient location during evaluation: Nursing Unit ?Anesthesia Type: Spinal ?Level of consciousness: oriented and awake and alert ?Pain management: pain level controlled ?Vital Signs Assessment: post-procedure vital signs reviewed and stable ?Respiratory status: spontaneous breathing and respiratory function stable ?Cardiovascular status: blood pressure returned to baseline and stable ?Postop Assessment: no headache, no backache, no apparent nausea or vomiting and patient able to bend at knees ?Anesthetic complications: no ? ? ?No notable events documented. ? ?Last Vitals:  ?Vitals:  ? 06/15/21 1300 06/15/21 1318  ?BP: 127/62 131/88  ?Pulse: 66 77  ?Resp: 20   ?Temp: 36.5 ?C 36.7 ?C  ?SpO2: 100% 100%  ?  ?Last Pain:  ?Vitals:  ? 06/15/21 1318  ?TempSrc: Oral  ?PainSc: 0-No pain  ? ? ?  ?  ?  ?  ?  ?  ? ?Trevor Iha ? ? ? ? ?

## 2021-06-15 NOTE — Transfer of Care (Signed)
Immediate Anesthesia Transfer of Care Note ? ?Patient: Glenn Collins ? ?Procedure(s) Performed: TOTAL HIP ARTHROPLASTY ANTERIOR APPROACH (Right: Hip) ? ?Patient Location: PACU ? ?Anesthesia Type:Spinal ? ?Level of Consciousness: awake, alert , oriented, drowsy and patient cooperative ? ?Airway & Oxygen Therapy: Patient Spontanous Breathing and Patient connected to face mask oxygen ? ?Post-op Assessment: Report given to RN, Post -op Vital signs reviewed and stable and Post -op Vital signs reviewed and unstable, Anesthesiologist notified--MDA called by CRNA to make aware of low BP; pt mentating appropriately; MDA consulted to bedside in PACU; per MDA, CRNA started Albumin; PACU RN informed and resumed care. ? ?Post vital signs: Reviewed and stable ? ?Last Vitals:  ?Vitals Value Taken Time  ?BP 88/51 06/15/21 1048  ?Temp    ?Pulse 69 06/15/21 1052  ?Resp 17 06/15/21 1052  ?SpO2 100 % 06/15/21 1052  ?Vitals shown include unvalidated device data. ? ?Last Pain:  ?Vitals:  ? 06/15/21 0649  ?TempSrc: Oral  ?PainSc:   ?   ? ?  ? ?Complications: No notable events documented. ?

## 2021-06-15 NOTE — Interval H&P Note (Signed)
History and Physical Interval Note: ? ?06/15/2021 ?7:53 AM ? ?Glenn Collins  has presented today for surgery, with the diagnosis of RIght hip osteoarthritis.  The various methods of treatment have been discussed with the patient and family. After consideration of risks, benefits and other options for treatment, the patient has consented to  Procedure(s): ?TOTAL HIP ARTHROPLASTY ANTERIOR APPROACH (Right) as a surgical intervention.  The patient's history has been reviewed, patient examined, no change in status, stable for surgery.  I have reviewed the patient's chart and labs.  Questions were answered to the patient's satisfaction.   ? ? ?[name] ? ? ?

## 2021-06-15 NOTE — Care Plan (Signed)
Ortho Bundle Case Management Note ? ?Patient Details  ?Name: Glenn Collins ?MRN: [id number] ?Date of Birth: 12-15-1945 ? ?                ?R THA on 06-15-21 DCP: Home with wife DME: RW ordered through [employer] PT: HEP ? ? ?DME Arranged:  Walker rolling ?DME Agency:  [employer] ? ?HH Arranged:    ?HH Agency:    ? ?Additional Comments: ?Please contact me with any questions of if this plan should need to change. ? ?Collene Schlichter  [phone] ?06/15/2021, 8:08 AM ?  ?

## 2021-06-15 NOTE — Op Note (Signed)
OPERATIVE REPORT ? ?SURGEON: Rod Can, MD  ? ?ASSISTANT: Nehemiah Massed, PA-C. ? ?PREOPERATIVE DIAGNOSIS: Right hip arthritis.  ? ?POSTOPERATIVE DIAGNOSIS: Right hip arthritis.  ? ?PROCEDURE: Right total hip arthroplasty, anterior approach.  ? ?IMPLANTS: Biomet Taperloc Complete Microplasty stem, size 15x149m, high offset. ?Biomet G7 OsseoTi Cup, size 58 mm. ?Biomet Vivacit-E liner, size 36 mm, G, +5 neutral. ?Biomet Biolox ceramic head ball, size 36 + 0 mm. ? ?ANESTHESIA:  MAC and Spinal ? ?ESTIMATED BLOOD LOSS:-450 mL   ? ?ANTIBIOTICS: 2 g Ancef. ? ?DRAINS: None. ? ?COMPLICATIONS: None. ?  ?CONDITION: PACU - hemodynamically stable.  ? ?BRIEF CLINICAL NOTE: Glenn Burbanois a 76y.o. male with a long-standing history of Right hip arthritis. After failing conservative management, the patient was indicated for total hip arthroplasty. The risks, benefits, and alternatives to the procedure were explained, and the patient elected to proceed. ? ?PROCEDURE IN DETAIL: Surgical site was marked by myself in the pre-op holding area. Once inside the operating room, spinal anesthesia was obtained, and a foley catheter was inserted. The patient was then positioned on the Hana table.  All bony prominences were well padded.  The hip was prepped and draped in the normal sterile surgical fashion.  A time-out was called verifying side and site of surgery. The patient received IV antibiotics within 60 minutes of beginning the procedure. ?  ?Bikini incision was made, and superficial dissection was performed lateral to the ASIS. The direct anterior approach to the hip was performed through the Hueter interval.  Lateral femoral circumflex vessels were treated with the Auqumantys. The anterior capsule was exposed and an inverted T capsulotomy was made. The femoral neck cut was made to the level of the templated cut.  A corkscrew was placed into the head and the head was removed.  The femoral head was found to have eburnated bone.  The head was passed to the back table and was measured. Pubofemoral ligament was released off of the calcar, taking care to stay on bone. Superior capsule was released from the greater trochanter, taking care to stay lateral to the posterior border of the femoral neck in order to preserve the short external rotators. ?  ?Acetabular exposure was achieved, and the pulvinar and labrum were excised. Sequential reaming of the acetabulum was then performed up to a size 57 mm reamer. A 58 mm cup was then opened and impacted into place at approximately 40 degrees of abduction and 20 degrees of anteversion. The final polyethylene liner was impacted into place and acetabular osteophytes were removed.  ?  ?I then gained femoral exposure taking care to protect the abductors and greater trochanter.  This was performed using standard external rotation, extension, and adduction.  A cookie cutter was used to enter the femoral canal, and then the femoral canal finder was placed.  Sequential broaching was performed up to a size 15.  Calcar planer was used on the femoral neck remnant.  I placed a high offset neck and a trial head ball.  The hip was reduced.  Leg lengths and offset were checked fluoroscopically.  The hip was dislocated and trial components were removed.  The final implants were placed, and the hip was reduced.  Fluoroscopy was used to confirm component position and leg lengths.  At 90 degrees of external rotation and full extension, the hip was stable to an anterior directed force. ?  ?The wound was copiously irrigated with Prontosan solution and normal saline using pule lavage.  Marcaine solution  was injected into the periarticular soft tissue.  The wound was closed in layers using #1 Vicryl and V-Loc for the fascia, 2-0 Vicryl for the subcutaneous fat, 2-0 Monocryl for the deep dermal layer, 3-0 running Monocryl subcuticular stitch, and Dermabond for the skin.  Once the glue was fully dried, an Aquacell Ag dressing  was applied.  The patient was transported to the recovery room in stable condition.  Sponge, needle, and instrument counts were correct at the end of the case x2.  The patient tolerated the procedure well and there were no known complications. ? ?Please note that a surgical assistant was a medical necessity for this procedure to perform it in a safe and expeditious manner. Assistant was necessary to provide appropriate retraction of vital neurovascular structures, to prevent femoral fracture, and to allow for anatomic placement of the prosthesis.  ?

## 2021-06-15 NOTE — Anesthesia Procedure Notes (Signed)
Spinal ? ?Patient location during procedure: OR ?Start time: 06/15/2021 8:26 AM ?End time: 06/15/2021 8:30 AM ?Reason for block: surgical anesthesia ?Staffing ?Performed: anesthesiologist  ?Anesthesiologist: Trevor Iha, MD ?Preanesthetic Checklist ?Completed: patient identified, IV checked, site marked, risks and benefits discussed, surgical consent, monitors and equipment checked, pre-op evaluation and timeout performed ?Spinal Block ?Patient position: sitting ?Prep: DuraPrep and site prepped and draped ?Patient monitoring: heart rate, cardiac monitor, continuous pulse ox and blood pressure ?Approach: midline ?Location: L3-4 ?Injection technique: single-shot ?Needle ?Needle type: Sprotte  ?Needle gauge: 24 G ?Needle length: 9 cm ?Needle insertion depth: 6 cm ?Assessment ?Sensory level: T4 ?Events: CSF return ?Additional Notes ? 1 Attempt (s). Pt tolerated procedure well. ? ? ? ?

## 2021-06-16 ENCOUNTER — Encounter (HOSPITAL_COMMUNITY): Payer: Self-pay | Admitting: Orthopedic Surgery

## 2021-06-16 DIAGNOSIS — I10 Essential (primary) hypertension: Secondary | ICD-10-CM | POA: Diagnosis not present

## 2021-06-16 DIAGNOSIS — Z87891 Personal history of nicotine dependence: Secondary | ICD-10-CM | POA: Diagnosis not present

## 2021-06-16 DIAGNOSIS — I2510 Atherosclerotic heart disease of native coronary artery without angina pectoris: Secondary | ICD-10-CM | POA: Diagnosis not present

## 2021-06-16 DIAGNOSIS — Z79899 Other long term (current) drug therapy: Secondary | ICD-10-CM | POA: Diagnosis not present

## 2021-06-16 DIAGNOSIS — Z7982 Long term (current) use of aspirin: Secondary | ICD-10-CM | POA: Diagnosis not present

## 2021-06-16 DIAGNOSIS — Z955 Presence of coronary angioplasty implant and graft: Secondary | ICD-10-CM | POA: Diagnosis not present

## 2021-06-16 DIAGNOSIS — M1611 Unilateral primary osteoarthritis, right hip: Secondary | ICD-10-CM | POA: Diagnosis not present

## 2021-06-16 DIAGNOSIS — Z950 Presence of cardiac pacemaker: Secondary | ICD-10-CM | POA: Diagnosis not present

## 2021-06-16 LAB — BASIC METABOLIC PANEL
Anion gap: 6 (ref 5–15)
BUN: 28 mg/dL — ABNORMAL HIGH (ref 8–23)
CO2: 23 mmol/L (ref 22–32)
Calcium: 8.3 mg/dL — ABNORMAL LOW (ref 8.9–10.3)
Chloride: 97 mmol/L — ABNORMAL LOW (ref 98–111)
Creatinine, Ser: 1.02 mg/dL (ref 0.61–1.24)
GFR, Estimated: >60 mL/min (ref >60)
Glucose, Bld: 135 mg/dL — ABNORMAL HIGH (ref 70–99)
Potassium: 4.7 mmol/L (ref 3.5–5.1)
Sodium: 126 mmol/L — ABNORMAL LOW (ref 135–145)

## 2021-06-16 LAB — CBC
HCT: 23.8 % — ABNORMAL LOW (ref 39.0–52.0)
Hemoglobin: 8.1 g/dL — ABNORMAL LOW (ref 13.0–17.0)
MCH: 31.9 pg (ref 26.0–34.0)
MCHC: 34 g/dL (ref 30.0–36.0)
MCV: 93.7 fL (ref 80.0–100.0)
Platelets: 182 10*3/uL (ref 150–400)
RBC: 2.54 MIL/uL — ABNORMAL LOW (ref 4.22–5.81)
RDW: 13.3 % (ref 11.5–15.5)
WBC: 11.5 10*3/uL — ABNORMAL HIGH (ref 4.0–10.5)
nRBC: 0 % (ref 0.0–0.2)

## 2021-06-16 MED ORDER — DOCUSATE SODIUM 100 MG PO CAPS: 100.0000 mg | ORAL_CAPSULE | Freq: Two times a day (BID) | ORAL | 0 refills | Status: DC

## 2021-06-16 MED ORDER — POLYETHYLENE GLYCOL 3350 17 G PO PACK: 17.0000 g | PACK | Freq: Every day | ORAL | 0 refills | Status: DC | PRN

## 2021-06-16 MED ORDER — METHOCARBAMOL 500 MG PO TABS: 500.0000 mg | ORAL_TABLET | Freq: Four times a day (QID) | ORAL | 0 refills | Status: DC | PRN

## 2021-06-16 MED ORDER — SENNA 8.6 MG PO TABS: 2.0000 | ORAL_TABLET | Freq: Every day | ORAL | 0 refills | Status: DC

## 2021-06-16 MED ORDER — HYDROCODONE-ACETAMINOPHEN 5-325 MG PO TABS: 1.0000 | ORAL_TABLET | ORAL | 0 refills | Status: DC | PRN

## 2021-06-16 MED ORDER — ONDANSETRON HCL 4 MG PO TABS: 4.0000 mg | ORAL_TABLET | Freq: Four times a day (QID) | ORAL | 0 refills | Status: DC | PRN

## 2021-06-16 MED ORDER — ASPIRIN 81 MG PO CHEW: 81.0000 mg | CHEWABLE_TABLET | Freq: Two times a day (BID) | ORAL | 0 refills | Status: AC

## 2021-06-16 NOTE — Progress Notes (Signed)
Physical Therapy Treatment ?Patient Details ?Name: Glenn Collins ?MRN: [id number] ?DOB: Sep 13, 1945 ?Today's Date: 06/16/2021 ? ? ?History of Present Illness 76 yo male s/p R DA THA on 06/15/21 PMH: NSTEMI, CAD, HTN, pacemaker ? ?  ?PT Comments  ? ? Pt performed LE exercises and ambulated in hallway.  Pt denies dizziness however reports fatigue and feeling winded after ambulation (although this resolved quickly).  Pt and spouse asking many questions which were answered within scope of practice.  Pt would benefit from another acute PT session and needs to practice steps prior to d/c home. ?   ?Recommendations for follow up therapy are one component of a multi-disciplinary discharge planning process, led by the attending physician.  Recommendations may be updated based on patient status, additional functional criteria and insurance authorization. ? ?Follow Up Recommendations ? Follow physician's recommendations for discharge plan and follow up therapies plan is for HEP ?  ?  ?Assistance Recommended at Discharge Intermittent Supervision/Assistance  ?Patient can return home with the following A little help with walking and/or transfers;Help with stairs or ramp for entrance;Assist for transportation;Assistance with cooking/housework ?  ?Equipment Recommendations ? Rolling walker (2 wheels)  ?  ?Recommendations for Other Services   ? ? ?  ?Precautions / Restrictions Precautions ?Precautions: Fall ?Restrictions ?Other Position/Activity Restrictions: WBAT  ?  ? ?Mobility ? Bed Mobility ?Overal bed mobility: Needs Assistance ?Bed Mobility: Supine to Sit ?  ?  ?Supine to sit: Min guard, HOB elevated ?  ?  ?General bed mobility comments: pt in recliner ?  ? ?Transfers ?Overall transfer level: Needs assistance ?Equipment used: Rolling walker (2 wheels) ?Transfers: Sit to/from Stand ?Sit to Stand: Min guard ?  ?  ?  ?  ?  ?General transfer comment: verbal cues for hand placement and weight shifting ?   ? ?Ambulation/Gait ?Ambulation/Gait assistance: Min guard ?Gait Distance (Feet): 90 Feet ?Assistive device: Rolling walker (2 wheels) ?Gait Pattern/deviations: Step-to pattern, Wide base of support ?Gait velocity: decr ?  ?  ?General Gait Details: cues for allowing more knee/hip flexion, cues for step length, posture, and RW positioning especially with turning; pt denies any dizziness ? ? ?Stairs ?  ?  ?  ?  ?  ? ? ?Wheelchair Mobility ?  ? ?Modified Rankin (Stroke Patients Only) ?  ? ? ?  ?Balance   ?  ?  ?  ?  ?  ?  ?  ?  ?  ?  ?  ?  ?  ?  ?  ?  ?  ?  ?  ? ?  ?Cognition Arousal/Alertness: Awake/alert ?Behavior During Therapy: [facility] for tasks assessed/performed ?Overall Cognitive Status: Within Functional Limits for tasks assessed ?  ?  ?  ?  ?  ?  ?  ?  ?  ?  ?  ?  ?  ?  ?  ?  ?  ?  ?  ? ?  ?Exercises Total Joint Exercises ?Ankle Circles/Pumps: AROM, Both, 10 reps ?Quad Sets: AROM, Both, 10 reps ?Heel Slides: AAROM, Right, 10 reps ?Hip ABduction/ADduction: AAROM, Right, 10 reps ?Long Arc Quad: AROM, Right, Seated, 10 reps ? ?  ?General Comments   ?  ?  ? ?Pertinent Vitals/Pain Pain Assessment ?Pain Assessment: 0-10 ?Pain Score: 3  ?Pain Location: right hip ?Pain Descriptors / Indicators: Burning ?Pain Intervention(s): Repositioned, Monitored during session  ? ? ?Home Living   ?  ?  ?  ?  ?  ?  ?  ?  ?  ?   ?  ?  Prior Function    ?  ?  ?   ? ?PT Goals (current goals can now be found in the care plan section) Progress towards PT goals: Progressing toward goals ? ?  ?Frequency ? ? ? 7X/week ? ? ? ?  ?PT Plan Current plan remains appropriate  ? ? ?Co-evaluation   ?  ?  ?  ?  ? ?  ?AM-PAC PT "6 Clicks" Mobility   ?Outcome Measure ? Help needed turning from your back to your side while in a flat bed without using bedrails?: A Little ?Help needed moving from lying on your back to sitting on the side of a flat bed without using bedrails?: A Little ?Help needed moving to and from a bed to a chair (including a wheelchair)?: A  Little ?Help needed standing up from a chair using your arms (e.g., wheelchair or bedside chair)?: A Little ?Help needed to walk in hospital room?: A Little ?Help needed climbing 3-5 steps with a railing? : A Lot ?6 Click Score: 17 ? ?  ?End of Session Equipment Utilized During Treatment: Gait belt ?Activity Tolerance: Patient tolerated treatment well ?Patient left: with call bell/phone within reach;in chair;with chair alarm set;with family/visitor present ?Nurse Communication: Mobility status ?PT Visit Diagnosis: Other abnormalities of gait and mobility (R26.89);Difficulty in walking, not elsewhere classified (R26.2) ?  ? ? ?Time: 0600-4599 ?PT Time Calculation (min) (ACUTE ONLY): 24 min ? ?Charges:  $Gait Training: 8-22 mins ?$Therapeutic Exercise: 8-22 mins          ?          ?Thomasene Mohair PT, DPT ?Acute Rehabilitation Services ?Pager: [phone] ?Office: [phone] ? ? ? ?Kati L Payson ?06/16/2021, 2:15 PM ? ?

## 2021-06-16 NOTE — Progress Notes (Addendum)
? ? ?  Subjective: ? ?Patient reports pain as mild to moderate.  Denies N/V/CP/SOB. No c/o. ? ?Objective:  ? ?VITALS:   ?Vitals:  ? 06/15/21 1819 06/15/21 2037 06/16/21 0045 06/16/21 0445  ?BP: 95/73 91/72 109/75 120/66  ?Pulse: 95 94 75 78  ?Resp:  _0 ?Temp:  98.6 ?F (37 ?C) 98.4 ?F (36.9 ?C) 98.2 ?F (36.8 ?C)  ?TempSrc:  Oral Oral Oral  ?SpO2: 100% 100% 100% 100%  ?Weight:      ?Height:      ? ? ?NAD ?ABD soft ?Sensation intact distally ?Intact pulses distally ?Dorsiflexion/Plantar flexion intact ?Incision: dressing C/D/I ?Compartment soft ? ? ?Lab Results  ?Component Value Date  ? WBC 11.5 (H) 06/16/2021  ? HGB 8.1 (L) 06/16/2021  ? HCT 23.8 (L) 06/16/2021  ? MCV 93.7 06/16/2021  ? PLT 182 06/16/2021  ? ?BMET ?   ?Component Value Date/Time  ? NA 126 (L) 06/16/2021 0339  ? NA 138 05/12/2021 0934  ? K 4.7 06/16/2021 0339  ? CL 97 (L) 06/16/2021 0339  ? CO2 23 06/16/2021 0339  ? GLUCOSE 135 (H) 06/16/2021 0339  ? BUN 28 (H) 06/16/2021 0339  ? BUN 19 05/12/2021 0934  ? CREATININE 1.02 06/16/2021 0339  ? CALCIUM 8.3 (L) 06/16/2021 0339  ? EGFR 62 05/12/2021 0934  ? GFRNONAA >60 06/16/2021 1517  ? ? ? ?Assessment/Plan: ?1 Day Post-Op  ? ?Principal Problem: ?  Osteoarthritis of right hip ? ? ?WBAT with walker ?DVT ppx: Aspirin, SCDs, TEDS ?PO pain control ?PT/OT ?ABLA: asymptomatic, monitor ?Dispo: D/C home w HEP after clears PT ? ? ?Hilton Cork Shandale Malak ?06/16/2021, 7:16 AM ? ? ?Rod Can, MD ?[phone] ?[facility] [facility] [employer]  [place] ?[address], [place], [place] [address] ?Phone: [phone] ?[email or web address] ?Facebook  Engineer, structural  ?  ? ? ?

## 2021-06-16 NOTE — Progress Notes (Signed)
Physical Therapy Treatment ?Patient Details ?Name: Glenn Collins ?MRN: [id number] ?DOB: Jan 04, 1946 ?Today's Date: 06/16/2021 ? ? ?History of Present Illness 76 yo male s/p R DA THA on 06/15/21 PMH: NSTEMI, CAD, HTN, pacemaker ? ?  ?PT Comments  ? ? Pt assisted with ambulating in hallway short distance.  Pt denies dizziness however requiring increased cues for gait training (was performing circumduction prior to surgery per pt).   ?   ?Recommendations for follow up therapy are one component of a multi-disciplinary discharge planning process, led by the attending physician.  Recommendations may be updated based on patient status, additional functional criteria and insurance authorization. ? ?Follow Up Recommendations ? Follow physician's recommendations for discharge plan and follow up therapies ?  ?  ?Assistance Recommended at Discharge Intermittent Supervision/Assistance  ?Patient can return home with the following A little help with walking and/or transfers;Help with stairs or ramp for entrance;Assist for transportation;Assistance with cooking/housework ?  ?Equipment Recommendations ? Rolling walker (2 wheels)  ?  ?Recommendations for Other Services   ? ? ?  ?Precautions / Restrictions Precautions ?Precautions: Fall ?Restrictions ?Other Position/Activity Restrictions: WBAT  ?  ? ?Mobility ? Bed Mobility ?Overal bed mobility: Needs Assistance ?Bed Mobility: Supine to Sit ?  ?  ?Supine to sit: Min guard, HOB elevated ?  ?  ?General bed mobility comments: increased time and effort, cues for self assist ?  ? ?Transfers ?Overall transfer level: Needs assistance ?Equipment used: Rolling walker (2 wheels) ?Transfers: Sit to/from Stand ?Sit to Stand: Min assist ?  ?  ?  ?  ?  ?General transfer comment: verbal cues for hand placement and weight shifting, assist to rise and steady ?  ? ?Ambulation/Gait ?Ambulation/Gait assistance: Min guard, Min assist ?Gait Distance (Feet): 60 Feet ?Assistive device: Rolling walker (2  wheels) ?Gait Pattern/deviations: Step-to pattern, Wide base of support ?Gait velocity: decr ?  ?  ?General Gait Details: cues for decreasing right circumduction and allowing more knee/hip flexion, cues for step length and RW positioning especially with turning; pt denies any dizziness today ? ? ?Stairs ?  ?  ?  ?  ?  ? ? ?Wheelchair Mobility ?  ? ?Modified Rankin (Stroke Patients Only) ?  ? ? ?  ?Balance   ?  ?  ?  ?  ?  ?  ?  ?  ?  ?  ?  ?  ?  ?  ?  ?  ?  ?  ?  ? ?  ?Cognition Arousal/Alertness: Awake/alert ?Behavior During Therapy: [facility] for tasks assessed/performed ?Overall Cognitive Status: Within Functional Limits for tasks assessed ?  ?  ?  ?  ?  ?  ?  ?  ?  ?  ?  ?  ?  ?  ?  ?  ?  ?  ?  ? ?  ?Exercises   ? ?  ?General Comments   ?  ?  ? ?Pertinent Vitals/Pain Pain Assessment ?Pain Assessment: 0-10 ?Pain Score: 2  ?Pain Location: right hip ?Pain Descriptors / Indicators: Aching, Sore ?Pain Intervention(s): Repositioned, Monitored during session  ? ? ?Home Living   ?  ?  ?  ?  ?  ?  ?  ?  ?  ?   ?  ?Prior Function    ?  ?  ?   ? ?PT Goals (current goals can now be found in the care plan section) Progress towards PT goals: Progressing toward goals ? ?  ?Frequency ? ? ? 7X/week ? ? ? ?  ?  PT Plan Current plan remains appropriate  ? ? ?Co-evaluation   ?  ?  ?  ?  ? ?  ?AM-PAC PT "6 Clicks" Mobility   ?Outcome Measure ? Help needed turning from your back to your side while in a flat bed without using bedrails?: A Little ?Help needed moving from lying on your back to sitting on the side of a flat bed without using bedrails?: A Little ?Help needed moving to and from a bed to a chair (including a wheelchair)?: A Little ?Help needed standing up from a chair using your arms (e.g., wheelchair or bedside chair)?: A Little ?Help needed to walk in hospital room?: A Little ?Help needed climbing 3-5 steps with a railing? : A Lot ?6 Click Score: 17 ? ?  ?End of Session Equipment Utilized During Treatment: Gait belt ?Activity  Tolerance: Patient tolerated treatment well ?Patient left: with call bell/phone within reach;in chair;with chair alarm set ?Nurse Communication: Mobility status ?PT Visit Diagnosis: Other abnormalities of gait and mobility (R26.89);Difficulty in walking, not elsewhere classified (R26.2) ?  ? ? ?Time: 1015-1030 ?PT Time Calculation (min) (ACUTE ONLY): 15 min ? ?Charges:  $Gait Training: 8-22 mins          ?          ?Thomasene Mohair PT, DPT ?Acute Rehabilitation Services ?Pager: [phone] ?Office: [phone] ? ? ? ?Kati L Payson ?06/16/2021, 10:41 AM ? ?

## 2021-06-16 NOTE — TOC Transition Note (Signed)
Transition of Care (TOC) - CM/SW Discharge Note ? ?Patient Details  ?Name: Glenn Collins ?MRN: [id number] ?Date of Birth: 08/25/1945 ? ?Transition of Care (TOC) CM/SW Contact:  ?Ewing Schlein, LCSW ?Phone Number: ?06/16/2021, 1:47 PM ? ?Clinical Narrative: Patient is expected to discharge home after working with PT. CSW spoke with wife to confirm discharge plan. Patient will go home with a home exercise program (HEP). Patient will need a rolling walker, which was delivered to the room by [employer]. TOC signing off. ? ?Final next level of care: Home/Self Care ?Barriers to Discharge: No Barriers Identified ? ?Patient Goals and CMS Choice ?Patient states their goals for this hospitalization and ongoing recovery are:: Discharge home with HEP ?CMS Medicare.gov Compare Post Acute Care list provided to:: Patient ?Choice offered to / list presented to : Patient ? ?Discharge Plan and Services         ?DME Arranged: Walker rolling ?DME Agency: [employer] ?Representative spoke with at DME Agency: Prearranged in orthopedist's office ? ?Readmission Risk Interventions ?No flowsheet data found. ? ?

## 2021-06-16 NOTE — Discharge Summary (Signed)
Physician Discharge Summary  Patient ID: Glenn Collins MRN: [id number] DOB/AGE: [birth year] 76 y.o.  Admit date: 06/15/2021 Discharge date: 06/17/2021  Admission Diagnoses:  Osteoarthritis of right hip  Discharge Diagnoses:  Principal Problem:   Osteoarthritis of right hip   Past Medical History:  Diagnosis Date   Arthritis    CAD (coronary artery disease) 10/27/2018   Coronary artery disease    Heart murmur Since childhood   Hypertension    PAD (peripheral artery disease) (HCC) 10/27/2018   Presence of permanent cardiac pacemaker    Biotronik PPM    Surgeries: Procedure(s): TOTAL HIP ARTHROPLASTY ANTERIOR APPROACH on 06/15/2021   Consultants (if any):   Discharged Condition: Improved  Hospital Course: Glenn Collins is an 76 y.o. male who was admitted 06/15/2021 with a diagnosis of Osteoarthritis of right hip and went to the operating room on 06/15/2021 and underwent the above named procedures.    He was given perioperative antibiotics:  Anti-infectives (From admission, onward)    Start     Dose/Rate Route Frequency Ordered Stop   06/15/21 1400  ceFAZolin (ANCEF) IVPB 2g/100 mL premix        2 g 200 mL/hr over 30 Minutes Intravenous Every 6 hours 06/15/21 1312 06/15/21 2047   06/15/21 0630  ceFAZolin (ANCEF) IVPB 2g/100 mL premix        2 g 200 mL/hr over 30 Minutes Intravenous On call to O.R. 06/15/21 [id number] 06/15/21 0904       He was given sequential compression devices, early ambulation, and ASA for DVT prophylaxis.  He benefited maximally from the hospital stay and there were no complications.    Recent vital signs:  Vitals:   06/16/21 2051 06/17/21 0553  BP: 134/72 123/63  Pulse: 95 89  Resp: 17 18  Temp: 99.2 F (37.3 C) 99.1 F (37.3 C)  SpO2: 98% 96%    Recent laboratory studies:  Lab Results  Component Value Date   HGB 7.4 (L) 06/17/2021   HGB 8.1 (L) 06/16/2021   HGB 12.3 (L) 06/08/2021   Lab Results  Component Value Date   WBC 8.1 06/17/2021    PLT 169 06/17/2021   Lab Results  Component Value Date   INR 1.0 05/04/2021   Lab Results  Component Value Date   NA 126 (L) 06/16/2021   K 4.7 06/16/2021   CL 97 (L) 06/16/2021   CO2 23 06/16/2021   BUN 28 (H) 06/16/2021   CREATININE 1.02 06/16/2021   GLUCOSE 135 (H) 06/16/2021     Allergies as of 06/17/2021   No Known Allergies      Medication List     STOP taking these medications    acetaminophen 650 MG CR tablet Commonly known as: TYLENOL   aspirin EC 81 MG tablet Replaced by: aspirin 81 MG chewable tablet       TAKE these medications    aspirin 81 MG chewable tablet Chew 1 tablet (81 mg total) by mouth 2 (two) times daily with a meal. Replaces: aspirin EC 81 MG tablet   atorvastatin 80 MG tablet Commonly known as: LIPITOR TAKE ONE TABLET BY MOUTH DAILY AT 6PM   diphenhydrAMINE 25 MG tablet Commonly known as: BENADRYL Take 25 mg by mouth at bedtime as needed for sleep or allergies.   docusate sodium 100 MG capsule Commonly known as: COLACE Take 1 capsule (100 mg total) by mouth 2 (two) times daily.   HYDROcodone-acetaminophen 5-325 MG tablet Commonly known as: NORCO/VICODIN Take 1 tablet by mouth  every 4 (four) hours as needed for moderate pain (pain score 4-6).   methocarbamol 500 MG tablet Commonly known as: ROBAXIN Take 1 tablet (500 mg total) by mouth every 6 (six) hours as needed for muscle spasms.   metoprolol succinate 25 MG 24 hr tablet Commonly known as: Toprol XL Take 1 tablet (25 mg total) by mouth daily.   multivitamin with minerals Tabs tablet Take 1 tablet by mouth daily.   nitroGLYCERIN 0.4 MG SL tablet Commonly known as: NITROSTAT Place 1 tablet (0.4 mg total) under the tongue every 5 (five) minutes x 3 doses as needed for chest pain.   ondansetron 4 MG tablet Commonly known as: ZOFRAN Take 1 tablet (4 mg total) by mouth every 6 (six) hours as needed for nausea.   polyethylene glycol 17 g packet Commonly known as:  MIRALAX / GLYCOLAX Take 17 g by mouth daily as needed for mild constipation.   ramipril 10 MG capsule Commonly known as: ALTACE Take 1 capsule (10 mg total) by mouth daily.   senna 8.6 MG Tabs tablet Commonly known as: SENOKOT Take 2 tablets (17.2 mg total) by mouth at bedtime.   sodium chloride 0.65 % Soln nasal spray Commonly known as: OCEAN Place 1 spray into both nostrils as needed for congestion.          WEIGHT BEARING   Weight bearing as tolerated with assist device (walker, cane, etc) as directed, use it as long as suggested by your surgeon or therapist, typically at least 4-6 weeks.   EXERCISES  Results after joint replacement surgery are often greatly improved when you follow the exercise, range of motion and muscle strengthening exercises prescribed by your doctor. Safety measures are also important to protect the joint from further injury. Any time any of these exercises cause you to have increased pain or swelling, decrease what you are doing until you are comfortable again and then slowly increase them. If you have problems or questions, call your caregiver or physical therapist for advice.   Rehabilitation is important following a joint replacement. After just a few days of immobilization, the muscles of the leg can become weakened and shrink (atrophy).  These exercises are designed to build up the tone and strength of the thigh and leg muscles and to improve motion. Often times heat used for twenty to thirty minutes before working out will loosen up your tissues and help with improving the range of motion but do not use heat for the first two weeks following surgery (sometimes heat can increase post-operative swelling).   These exercises can be done on a training (exercise) mat, on the floor, on a table or on a bed. Use whatever works the best and is most comfortable for you.    Use music or television while you are exercising so that the exercises are a pleasant break  in your day. This will make your life better with the exercises acting as a break in your routine that you can look forward to.   Perform all exercises about fifteen times, three times per day or as directed.  You should exercise both the operative leg and the other leg as well.  Exercises include:   Quad Sets - Tighten up the muscle on the front of the thigh (Quad) and hold for 5-10 seconds.   Straight Leg Raises - With your knee straight (if you were given a brace, keep it on), lift the leg to 60 degrees, hold for 3 seconds, and slowly  lower the leg.  Perform this exercise against resistance later as your leg gets stronger.  Leg Slides: Lying on your back, slowly slide your foot toward your buttocks, bending your knee up off the floor (only go as far as is comfortable). Then slowly slide your foot back down until your leg is flat on the floor again.  Angel Wings: Lying on your back spread your legs to the side as far apart as you can without causing discomfort.  Hamstring Strength:  Lying on your back, push your heel against the floor with your leg straight by tightening up the muscles of your buttocks.  Repeat, but this time bend your knee to a comfortable angle, and push your heel against the floor.  You may put a pillow under the heel to make it more comfortable if necessary.   A rehabilitation program following joint replacement surgery can speed recovery and prevent re-injury in the future due to weakened muscles. Contact your doctor or a physical therapist for more information on knee rehabilitation.    CONSTIPATION  Constipation is defined medically as fewer than three stools per week and severe constipation as less than one stool per week.  Even if you have a regular bowel pattern at home, your normal regimen is likely to be disrupted due to multiple reasons following surgery.  Combination of anesthesia, postoperative narcotics, change in appetite and fluid intake all can affect your bowels.    YOU MUST use at least one of the following options; they are listed in order of increasing strength to get the job done.  They are all available over the counter, and you may need to use some, POSSIBLY even all of these options:    Drink plenty of fluids (prune juice may be helpful) and high fiber foods Colace 100 mg by mouth twice a day  Senokot for constipation as directed and as needed Dulcolax (bisacodyl), take with full glass of water  Miralax (polyethylene glycol) once or twice a day as needed.  If you have tried all these things and are unable to have a bowel movement in the first 3-4 days after surgery call either your surgeon or your primary doctor.    If you experience loose stools or diarrhea, hold the medications until you stool forms back up.  If your symptoms do not get better within 1 week or if they get worse, check with your doctor.  If you experience "the worst abdominal pain ever" or develop nausea or vomiting, please contact the office immediately for further recommendations for treatment.   ITCHING:  If you experience itching with your medications, try taking only a single pain pill, or even half a pain pill at a time.  You can also use Benadryl over the counter for itching or also to help with sleep.   TED HOSE STOCKINGS:  Use stockings on both legs until for at least 2 weeks or as directed by physician office. They may be removed at night for sleeping.  MEDICATIONS:  See your medication summary on the After Visit Summary that nursing will review with you.  You may have some home medications which will be placed on hold until you complete the course of blood thinner medication.  It is important for you to complete the blood thinner medication as prescribed.  PRECAUTIONS:  If you experience chest pain or shortness of breath - call 911 immediately for transfer to the hospital emergency department.   If you develop a fever greater that 101  F, purulent drainage from wound,  increased redness or drainage from wound, foul odor from the wound/dressing, or calf pain - CONTACT YOUR SURGEON.                                                   FOLLOW-UP APPOINTMENTS:  If you do not already have a post-op appointment, please call the office for an appointment to be seen by your surgeon.  Guidelines for how soon to be seen are listed in your After Visit Summary, but are typically between 1-4 weeks after surgery.  OTHER INSTRUCTIONS:   Knee Replacement:  Do not place pillow under knee, focus on keeping the knee straight while resting. CPM instructions: 0-90 degrees, 2 hours in the morning, 2 hours in the afternoon, and 2 hours in the evening. Place foam block, curve side up under heel at all times except when in CPM or when walking.  DO NOT modify, tear, cut, or change the foam block in any way.   MAKE SURE YOU:  Understand these instructions.  Get help right away if you are not doing well or get worse.    Thank you for letting us be a part of your medical care team.  It is a privilege we respect greatly.  We hope these instructions will help you stay on track for a fast and full recovery!   Diagnostic Studies: DG Pelvis Portable  Result Date: 06/15/2021 CLINICAL DATA:  76 year old male status post right hip arthroplasty. EXAM: PORTABLE PELVIS 1-2 VIEWS COMPARISON:  Intraoperative images 0854 hours today. CT Abdomen and Pelvis 01/06/2019. FINDINGS: Portable AP supine view at 1122 hours. Right bipolar hip arthroplasty hardware appears intact, with normal alignment on this AP view. No unexpected osseous changes. Regional postoperative soft tissue gas. Chronically advanced iliofemoral calcified atherosclerosis. Left femoral head normally located. Pelvis appears intact. IMPRESSION: 1. Right bipolar hip arthroplasty with no adverse features. 2. Chronic severe iliofemoral calcified atherosclerosis. Electronically Signed   By: Odessa Fleming M.D.   On: 06/15/2021 11:30   DG C-Arm 1-60  Min-No Report  Result Date: 06/15/2021 Fluoroscopy was utilized by the requesting physician.  No radiographic interpretation.   DG C-Arm 1-60 Min-No Report  Result Date: 06/15/2021 Fluoroscopy was utilized by the requesting physician.  No radiographic interpretation.   DG HIP UNILAT WITH PELVIS 1V RIGHT  Result Date: 06/15/2021 CLINICAL DATA:  Right hip replacement. EXAM: DG HIP (WITH OR WITHOUT PELVIS) 1V RIGHT COMPARISON:  Right hip x-rays dated February 04, 2021. FLUOROSCOPY TIME:  Radiation Exposure Index (as provided by the fluoroscopic device): 1.87 mGy Kerma C-arm fluoroscopic images were obtained intraoperatively and submitted for post operative interpretation. FINDINGS: Two intraoperative fluoroscopic images demonstrate interval right total hip arthroplasty. Components are well aligned. No acute osseous abnormality. IMPRESSION: 1. Intraoperative fluoroscopic guidance for right total hip arthroplasty. Electronically Signed   By: Obie Dredge M.D.   On: 06/15/2021 10:25    Disposition: Discharge disposition: 01-Home or Self Care       Discharge Instructions     Call MD / Call 911   Complete by: As directed    If you experience chest pain or shortness of breath, CALL 911 and be transported to the hospital emergency room.  If you develope a fever above 101 F, pus (white drainage) or increased drainage or redness at the wound, or  calf pain, call your surgeon's office.   Constipation Prevention   Complete by: As directed    Drink plenty of fluids.  Prune juice may be helpful.  You may use a stool softener, such as Colace (over the counter) 100 mg twice a day.  Use MiraLax (over the counter) for constipation as needed.   Diet - low sodium heart healthy   Complete by: As directed    Discharge instructions   Complete by: As directed    Elevate toes above nose. Ice hip for 20 minutes every 2 hours. Every hour while awake, get up and ambulate a short distance in the home.  Do not resume  your Ramipril (ALTACE) until your blood pressure is greater than 130 systolic.   Driving restrictions   Complete by: As directed    No driving for 6 weeks   Increase activity slowly as tolerated   Complete by: As directed    Lifting restrictions   Complete by: As directed    No lifting for 6 weeks   Post-operative opioid taper instructions:   Complete by: As directed    POST-OPERATIVE OPIOID TAPER INSTRUCTIONS: It is important to wean off of your opioid medication as soon as possible. If you do not need pain medication after your surgery it is ok to stop day one. Opioids include: Codeine, Hydrocodone(Norco, Vicodin), Oxycodone(Percocet, oxycontin) and hydromorphone amongst others.  Long term and even short term use of opiods can cause: Increased pain response Dependence Constipation Depression Respiratory depression And more.  Withdrawal symptoms can include Flu like symptoms Nausea, vomiting And more Techniques to manage these symptoms Hydrate well Eat regular healthy meals Stay active Use relaxation techniques(deep breathing, meditating, yoga) Do Not substitute Alcohol to help with tapering If you have been on opioids for less than two weeks and do not have pain than it is ok to stop all together.  Plan to wean off of opioids This plan should start within one week post op of your joint replacement. Maintain the same interval or time between taking each dose and first decrease the dose.  Cut the total daily intake of opioids by one tablet each day Next start to increase the time between doses. The last dose that should be eliminated is the evening dose.      TED hose   Complete by: As directed    Use stockings (TED hose) for 2 weeks on both leg(s).  You may remove them at night for sleeping.        Follow-up Information     Samson Frederic, MD. Go on 06/28/2021.   Specialty: Orthopedic Surgery Why: You are scheduled for first post op appointment on Tuesday March  14th at 11:00am. Contact information: [address] [place] [address] [place] [address] [phone]                  Signed: Iline Oven Dashawn Golda 06/17/2021, 7:04 AM

## 2021-06-16 NOTE — Plan of Care (Signed)
Plan of care reviewed and discussed with the patient. 

## 2021-06-16 NOTE — Plan of Care (Signed)
  Problem: Education: Goal: Knowledge of General Education information will improve Description Including pain rating scale, medication(s)/side effects and non-pharmacologic comfort measures Outcome: Progressing   

## 2021-06-17 ENCOUNTER — Ambulatory Visit: Payer: Medicare PPO | Admitting: Cardiovascular Disease

## 2021-06-17 DIAGNOSIS — Z79899 Other long term (current) drug therapy: Secondary | ICD-10-CM | POA: Diagnosis not present

## 2021-06-17 DIAGNOSIS — Z955 Presence of coronary angioplasty implant and graft: Secondary | ICD-10-CM | POA: Diagnosis not present

## 2021-06-17 DIAGNOSIS — I10 Essential (primary) hypertension: Secondary | ICD-10-CM | POA: Diagnosis not present

## 2021-06-17 DIAGNOSIS — Z950 Presence of cardiac pacemaker: Secondary | ICD-10-CM | POA: Diagnosis not present

## 2021-06-17 DIAGNOSIS — Z87891 Personal history of nicotine dependence: Secondary | ICD-10-CM | POA: Diagnosis not present

## 2021-06-17 DIAGNOSIS — M1611 Unilateral primary osteoarthritis, right hip: Secondary | ICD-10-CM | POA: Diagnosis not present

## 2021-06-17 DIAGNOSIS — Z7982 Long term (current) use of aspirin: Secondary | ICD-10-CM | POA: Diagnosis not present

## 2021-06-17 DIAGNOSIS — I2510 Atherosclerotic heart disease of native coronary artery without angina pectoris: Secondary | ICD-10-CM | POA: Diagnosis not present

## 2021-06-17 LAB — CBC
HCT: 21.4 % — ABNORMAL LOW (ref 39.0–52.0)
Hemoglobin: 7.4 g/dL — ABNORMAL LOW (ref 13.0–17.0)
MCH: 32.5 pg (ref 26.0–34.0)
MCHC: 34.6 g/dL (ref 30.0–36.0)
MCV: 93.9 fL (ref 80.0–100.0)
Platelets: 169 10*3/uL (ref 150–400)
RBC: 2.28 MIL/uL — ABNORMAL LOW (ref 4.22–5.81)
RDW: 13.5 % (ref 11.5–15.5)
WBC: 8.1 10*3/uL (ref 4.0–10.5)
nRBC: 0 % (ref 0.0–0.2)

## 2021-06-17 NOTE — Progress Notes (Signed)
? ? ?  Subjective: ? ?Patient reports pain as mild to moderate.  Denies N/V/CP/SOB. No c/o. Did not pass PT yesterday. ? ?Objective:  ? ?VITALS:   ?Vitals:  ? 06/16/21 1037 06/16/21 1412 06/16/21 2051 06/17/21 0553  ?BP: 116/64 116/64 134/72 123/63  ?Pulse: 91 86 95 89  ?Resp: $Remov'18 18 17 18  'vbGMut$ ?Temp: 97.8 ?F (36.6 ?C) 98.1 ?F (36.7 ?C) 99.2 ?F (37.3 ?C) 99.1 ?F (37.3 ?C)  ?TempSrc: Oral     ?SpO2: 99% 99% 98% 96%  ?Weight:      ?Height:      ? ? ?NAD ?ABD soft ?Sensation intact distally ?Intact pulses distally ?Dorsiflexion/Plantar flexion intact ?Incision: dressing C/D/I ?Compartment soft ? ? ?Lab Results  ?Component Value Date  ? WBC 8.1 06/17/2021  ? HGB 7.4 (L) 06/17/2021  ? HCT 21.4 (L) 06/17/2021  ? MCV 93.9 06/17/2021  ? PLT 169 06/17/2021  ? ?BMET ?   ?Component Value Date/Time  ? NA 126 (L) 06/16/2021 0339  ? NA 138 05/12/2021 0934  ? K 4.7 06/16/2021 0339  ? CL 97 (L) 06/16/2021 0339  ? CO2 23 06/16/2021 0339  ? GLUCOSE 135 (H) 06/16/2021 0339  ? BUN 28 (H) 06/16/2021 0339  ? BUN 19 05/12/2021 0934  ? CREATININE 1.02 06/16/2021 0339  ? CALCIUM 8.3 (L) 06/16/2021 0339  ? EGFR 62 05/12/2021 0934  ? GFRNONAA >60 06/16/2021 4388  ? ? ? ?Assessment/Plan: ?2 Days Post-Op  ? ?Principal Problem: ?  Osteoarthritis of right hip ? ? ?WBAT with walker ?DVT ppx: Aspirin, SCDs, TEDS ?PO pain control ?PT/OT ?ABLA: asymptomatic, monitor ?Dispo: D/C home w HEP after clears PT ? ? ?H[place] ?06/17/2021, 7:36 AM ? ? ?Rod Can, MD ?[phone] ?[facility] [employer]  [facility] ?[address], [place], [place] [address] ?Phone: [phone] ?[email or web address] ?Facebook  Engineer, structural  ?  ? ? ?

## 2021-06-17 NOTE — Plan of Care (Signed)
Pt ready to DC home with wife. 

## 2021-06-17 NOTE — Progress Notes (Signed)
Physical Therapy Treatment ?Patient Details ?Name: Glenn Collins ?MRN: [id number] ?DOB: 12/16/1945 ?Today's Date: 06/17/2021 ? ? ?History of Present Illness 76 yo male s/p R DA THA on 06/15/21 PMH: NSTEMI, CAD, HTN, pacemaker ? ?  ?PT Comments  ? ? Pt is POD # 2 and is progressing well.  Pt reports burning in thigh that makes gait difficult but was still able to ambulate 100' safely with RW.  Pt demonstrates safe gait & transfers in order to return home from PT perspective once discharged by MD.  While in hospital, will continue to benefit from PT for skilled therapy to advance mobility and exercises.   ?   ?Recommendations for follow up therapy are one component of a multi-disciplinary discharge planning process, led by the attending physician.  Recommendations may be updated based on patient status, additional functional criteria and insurance authorization. ? ?Follow Up Recommendations ? Follow physician's recommendations for discharge plan and follow up therapies ?  ?  ?Assistance Recommended at Discharge Intermittent Supervision/Assistance  ?Patient can return home with the following A little help with walking and/or transfers;Help with stairs or ramp for entrance;Assist for transportation;Assistance with cooking/housework ?  ?Equipment Recommendations ? Rolling walker (2 wheels)  ?  ?Recommendations for Other Services   ? ? ?  ?Precautions / Restrictions Precautions ?Precautions: Fall ?Restrictions ?Weight Bearing Restrictions: No ?Other Position/Activity Restrictions: WBAT  ?  ? ?Mobility ? Bed Mobility ?  ?  ?  ?  ?  ?  ?  ?General bed mobility comments: pt in recliner ?  ? ?Transfers ?Overall transfer level: Needs assistance ?Equipment used: Rolling walker (2 wheels) ?Transfers: Sit to/from Stand ?Sit to Stand: Supervision ?  ?  ?  ?  ?  ?General transfer comment: Sit to stand x 4 during session; min cues for hand placement ?  ? ?Ambulation/Gait ?Ambulation/Gait assistance: Min guard, Supervision ?Gait Distance  (Feet): 100 Feet (100', 20', 30') ?Assistive device: Rolling walker (2 wheels) ?Gait Pattern/deviations: Step-to pattern, Wide base of support, Shuffle ?Gait velocity: decr ?  ?  ?General Gait Details: Cues for posture and RW positiong; set pt's RW height for home; ambulated 100' then 2 shorter bouts; fatigued easily but denied dizziness ? ? ?Stairs ?Stairs: Yes ?Stairs assistance: Min guard ?Stair Management: Two rails, Step to pattern, Forwards ?Number of Stairs: 3 ?General stair comments: Cued/educated on sequencing and guarding (wife present); performed easily ? ? ?Wheelchair Mobility ?  ? ?Modified Rankin (Stroke Patients Only) ?  ? ? ?  ?Balance Overall balance assessment: Needs assistance ?Sitting-balance support: No upper extremity supported ?Sitting balance-Leahy Scale: Good ?  ?  ?Standing balance support: During functional activity, Bilateral upper extremity supported ?Standing balance-Leahy Scale: Good ?Standing balance comment: RW to ambulate but could do toileting ADLs/wash hands/static stand without support ?  ?  ?  ?  ?  ?  ?  ?  ?  ?  ?  ?  ? ?  ?Cognition Arousal/Alertness: Awake/alert ?Behavior During Therapy: [facility] for tasks assessed/performed ?Overall Cognitive Status: Within Functional Limits for tasks assessed ?  ?  ?  ?  ?  ?  ?  ?  ?  ?  ?  ?  ?  ?  ?  ?  ?  ?  ?  ? ?  ?Exercises Total Joint Exercises ?Ankle Circles/Pumps: AROM, Both, 10 reps ?Quad Sets: AROM, Both, 10 reps (cues for hold) ?Heel Slides: AAROM, Right, 10 reps (Educated on gait belt for AAROM) ?Hip ABduction/ADduction: AAROM, Right,  10 reps (Educated on gait belt for AAROM) ?Long Arc Quad: AROM, Right, Seated, 10 reps ?Marching in Standing: AROM, Right, 10 reps, Standing ?Other Exercises ?Other Exercises: Standing H-S curl and hip abduction x 10 on R with RW ? ?  ?General Comments General comments (skin integrity, edema, etc.): Wife - deborah and Friend?/Family -Almyra Free present ?Educated on safe ice use, no pivots, car transfers,  sleep positions (pillows for comfort), bathroom transfers, how to get up if fall, HEP, and TED hose during day. Also, encouraged walking every 1-2 hours during day. Educated on HEP with focus on mobility the first weeks. Discussed doing exercises within pain control and if pain increasing could decreased ROM, reps, and stop exercises as needed. . ?  ?  ? ?Pertinent Vitals/Pain Pain Assessment ?Pain Assessment: 0-10 ?Pain Score: 5  ?Pain Location: right hip ?Pain Descriptors / Indicators: Burning ?Pain Intervention(s): Limited activity within patient's tolerance, Monitored during session, Repositioned, Ice applied  ? ? ?Home Living   ?  ?  ?  ?  ?  ?  ?  ?  ?  ?   ?  ?Prior Function    ?  ?  ?   ? ?PT Goals (current goals can now be found in the care plan section) Progress towards PT goals: Progressing toward goals ? ?  ?Frequency ? ? ? 7X/week ? ? ? ?  ?PT Plan Current plan remains appropriate  ? ? ?Co-evaluation   ?  ?  ?  ?  ? ?  ?AM-PAC PT "6 Clicks" Mobility   ?Outcome Measure ? Help needed turning from your back to your side while in a flat bed without using bedrails?: A Little ?Help needed moving from lying on your back to sitting on the side of a flat bed without using bedrails?: A Little ?Help needed moving to and from a bed to a chair (including a wheelchair)?: A Little ?Help needed standing up from a chair using your arms (e.g., wheelchair or bedside chair)?: A Little ?Help needed to walk in hospital room?: A Little ?Help needed climbing 3-5 steps with a railing? : A Little ?6 Click Score: 18 ? ?  ?End of Session Equipment Utilized During Treatment: Gait belt ?Activity Tolerance: Patient tolerated treatment well ?Patient left: in chair;with call bell/phone within reach;with family/visitor present ?Nurse Communication: Mobility status (clear for d/c) ?PT Visit Diagnosis: Other abnormalities of gait and mobility (R26.89);Difficulty in walking, not elsewhere classified (R26.2) ?  ? ? ?Time: 0355-9741 ?PT Time  Calculation (min) (ACUTE ONLY): 43 min ? ?Charges:  $Gait Training: 8-22 mins ?$Therapeutic Exercise: 8-22 mins ?$Therapeutic Activity: 8-22 mins          ?          ? ?Anise Salvo, PT ?Acute Rehab Services ?Pager [phone] ?[facility] [phone] ? ? ? ?Billey Chang Selyna Klahn ?06/17/2021, 11:42 AM ? ?

## 2021-06-22 ENCOUNTER — Other Ambulatory Visit: Payer: Self-pay

## 2021-06-22 MED ORDER — METOPROLOL SUCCINATE ER 25 MG PO TB24: 25.0000 mg | ORAL_TABLET | Freq: Every day | ORAL | 1 refills | Status: DC

## 2021-06-28 ENCOUNTER — Ambulatory Visit (INDEPENDENT_AMBULATORY_CARE_PROVIDER_SITE_OTHER): Payer: Medicare PPO

## 2021-06-28 DIAGNOSIS — Z4732 Aftercare following explantation of hip joint prosthesis: Secondary | ICD-10-CM | POA: Diagnosis not present

## 2021-06-28 DIAGNOSIS — I442 Atrioventricular block, complete: Secondary | ICD-10-CM

## 2021-06-28 DIAGNOSIS — Z471 Aftercare following joint replacement surgery: Secondary | ICD-10-CM | POA: Diagnosis not present

## 2021-06-28 LAB — CUP PACEART REMOTE DEVICE CHECK
Date Time Interrogation Session: 20230314091039
Implantable Lead Implant Date: 20210614
Implantable Lead Implant Date: 20210614
Implantable Lead Location: 753859
Implantable Lead Location: 753860
Implantable Lead Model: 377171
Implantable Lead Model: 377171
Implantable Lead Serial Number: 7000244971
Implantable Lead Serial Number: 7000271886
Implantable Pulse Generator Implant Date: 20210614
Pulse Gen Model: 407145
Pulse Gen Serial Number: 69870190

## 2021-07-04 ENCOUNTER — Other Ambulatory Visit: Payer: Self-pay

## 2021-07-04 MED ORDER — ATORVASTATIN CALCIUM 80 MG PO TABS: ORAL_TABLET | ORAL | 0 refills | Status: DC

## 2021-07-08 ENCOUNTER — Other Ambulatory Visit: Payer: Self-pay

## 2021-07-08 ENCOUNTER — Encounter (HOSPITAL_BASED_OUTPATIENT_CLINIC_OR_DEPARTMENT_OTHER): Payer: Self-pay | Admitting: Family Medicine

## 2021-07-08 ENCOUNTER — Other Ambulatory Visit (HOSPITAL_BASED_OUTPATIENT_CLINIC_OR_DEPARTMENT_OTHER): Payer: Self-pay

## 2021-07-08 ENCOUNTER — Ambulatory Visit (HOSPITAL_BASED_OUTPATIENT_CLINIC_OR_DEPARTMENT_OTHER)
Admission: RE | Admit: 2021-07-08 | Discharge: 2021-07-08 | Disposition: A | Discharge status: Home / Self Care | Payer: Medicare PPO | Source: Ambulatory Visit | Attending: Family Medicine | Admitting: Family Medicine

## 2021-07-08 ENCOUNTER — Ambulatory Visit (HOSPITAL_BASED_OUTPATIENT_CLINIC_OR_DEPARTMENT_OTHER): Payer: Medicare PPO | Admitting: Family Medicine

## 2021-07-08 VITALS — HR 78 | Temp 98.6°F | Ht 71.0 in | Wt 203.5 lb

## 2021-07-08 DIAGNOSIS — D649 Anemia, unspecified: Secondary | ICD-10-CM | POA: Diagnosis not present

## 2021-07-08 DIAGNOSIS — I82401 Acute embolism and thrombosis of unspecified deep veins of right lower extremity: Secondary | ICD-10-CM | POA: Diagnosis not present

## 2021-07-08 DIAGNOSIS — I82451 Acute embolism and thrombosis of right peroneal vein: Secondary | ICD-10-CM

## 2021-07-08 DIAGNOSIS — R600 Localized edema: Secondary | ICD-10-CM

## 2021-07-08 MED ORDER — RIVAROXABAN (XARELTO) VTE STARTER PACK (15 & 20 MG)
ORAL_TABLET | ORAL | 0 refills | Status: DC
  Filled 2021-07-08: qty 51, 30d supply, fill #0

## 2021-07-08 NOTE — Patient Instructions (Signed)

## 2021-07-08 NOTE — Progress Notes (Addendum)
? ? ?  Procedures performed today:   ? ?None. ? ?Independent interpretation of notes and tests performed by another provider:  ? ?None. ? ?Brief History, Exam, Impression, and Recommendations:   ? ?Pulse 78   Temp 98.6 ?F (37 ?C)   Ht 5\' 11"  (1.803 m)   Wt 203 lb 8 oz (92.3 kg)   SpO2 97%   BMI 28.38 kg/m?  ? ?Lower extremity edema ?Patient is a 76 year old male presenting for follow-up.  Patient had recent right hip arthroplasty.  Generally, he has been doing well since the procedure with improved pain control, improved gait.  He is currently ambulating with a walker, has had initial follow-up with orthopedic surgeon and was told that he is doing well and that surgical site is healing as expected.  He was provided with home exercises by orthopedic surgeon, no specific physical therapy or rehab arranged. ?He has been having some right lower extremity swelling with some lower leg pain as well.  He denies any issues with shortness of breath or activity intolerance presently. ?On exam, patient does have 1+ pitting edema in right lower extremity along tibia and distally.  There is no edema in the left lower extremity.  He is wearing compression stockings currently.  He does have some tenderness to palpation along posterior gastrocnemius.  He does not have any tachycardia. ?Given unilateral lower extremity edema as well as tenderness to palpation over gastroc, discussed concern for potential DVT.  It is also possible that swelling and edema are simply related to recent surgery, however would recommend proceeding with ultrasound imaging to rule out DVT.  Patient will proceed to have imaging done in the imaging center downstairs here at [facility].  If DVT is present, will manage accordingly.  If ultrasound is reassuring, then likely would continue with home exercise program, use of compression stockings, intermittent elevation of lower extremities. ?Plan for follow-up in about 2 to 3 months or sooner as  needed ? ?Patient plans to reach out to his orthopedic surgeons office to discuss possibility of initiating physical therapy.  He may look to have PT completed here at [facility] ? ? ?___________________________________________ ?Chantella Creech de [place], MD, ABFM, CAQSM ?Primary Care and Sports Medicine ?[place] ? ?US revealed DVT. Spoke with patient regarding result and treatment. Xarelto sent to [place] as patient was there at time of speaking with him. We will plan for follow-up in about 1 month to monitor. Will also have patient come to the office next week to have CBC completed due to anemia at time of hip surgery. ?

## 2021-07-08 NOTE — Assessment & Plan Note (Signed)
Patient is a 76 year old male presenting for follow-up.  Patient had recent right hip arthroplasty.  Generally, he has been doing well since the procedure with improved pain control, improved gait.  He is currently ambulating with a walker, has had initial follow-up with orthopedic surgeon and was told that he is doing well and that surgical site is healing as expected.  He was provided with home exercises by orthopedic surgeon, no specific physical therapy or rehab arranged. ?He has been having some right lower extremity swelling with some lower leg pain as well.  He denies any issues with shortness of breath or activity intolerance presently. ?On exam, patient does have 1+ pitting edema in right lower extremity along tibia and distally.  There is no edema in the left lower extremity.  He is wearing compression stockings currently.  He does have some tenderness to palpation along posterior gastrocnemius.  He does not have any tachycardia. ?Given unilateral lower extremity edema as well as tenderness to palpation over gastroc, discussed concern for potential DVT.  It is also possible that swelling and edema are simply related to recent surgery, however would recommend proceeding with ultrasound imaging to rule out DVT.  Patient will proceed to have imaging done in the imaging center downstairs here at [facility].  If DVT is present, will manage accordingly.  If ultrasound is reassuring, then likely would continue with home exercise program, use of compression stockings, intermittent elevation of lower extremities. ?Plan for follow-up in about 2 to 3 months or sooner as needed ?

## 2021-07-08 NOTE — Addendum Note (Signed)
Addended by: DE [place], Kinley Dozier J on: 07/08/2021 01:32 PM ? ? Modules accepted: Orders ? ?

## 2021-07-11 ENCOUNTER — Telehealth (HOSPITAL_BASED_OUTPATIENT_CLINIC_OR_DEPARTMENT_OTHER): Payer: Self-pay | Admitting: Family Medicine

## 2021-07-11 NOTE — Telephone Encounter (Signed)
Received a fax from after hours on 3/24 after the office had closed regarding Radiology called to report a stat lab result. Lab number left was [phone] ?Please advise. ?

## 2021-07-11 NOTE — Telephone Encounter (Signed)
Called pt regarding the message provider wanted to see pt back for labs and f/u appt ?"Patient diagnosed with DVT.  If we can please schedule him for a sooner follow-up in about 1 month.  Also, I placed an order for a CBC, if he can please schedule a nurse visit during the week of March 27 to have this completed.  He had some anemia at the time of his hip replacement and would want to follow-up on this lab. " ?

## 2021-07-12 NOTE — Progress Notes (Signed)
Remote pacemaker transmission.   

## 2021-07-20 ENCOUNTER — Ambulatory Visit (HOSPITAL_BASED_OUTPATIENT_CLINIC_OR_DEPARTMENT_OTHER): Payer: Medicare PPO

## 2021-07-20 DIAGNOSIS — E875 Hyperkalemia: Secondary | ICD-10-CM

## 2021-07-20 DIAGNOSIS — Z01818 Encounter for other preprocedural examination: Secondary | ICD-10-CM

## 2021-07-20 DIAGNOSIS — D649 Anemia, unspecified: Secondary | ICD-10-CM

## 2021-07-20 LAB — BASIC METABOLIC PANEL
BUN/Creatinine Ratio: 15 (ref 10–24)
BUN: 13 mg/dL (ref 8–27)
CO2: 22 mmol/L (ref 20–29)
Calcium: 9.5 mg/dL (ref 8.6–10.2)
Chloride: 97 mmol/L (ref 96–106)
Creatinine, Ser: 0.86 mg/dL (ref 0.76–1.27)
Glucose: 137 mg/dL — ABNORMAL HIGH (ref 70–99)
Potassium: 4.4 mmol/L (ref 3.5–5.2)
Sodium: 138 mmol/L (ref 134–144)
eGFR: 90 mL/min/{1.73_m2} (ref >59)

## 2021-07-20 LAB — CBC WITH DIFFERENTIAL/PLATELET
Basophils Absolute: 0 10*3/uL (ref 0.0–0.2)
Basos: 1 %
EOS (ABSOLUTE): 0.2 10*3/uL (ref 0.0–0.4)
Eos: 2 %
Hematocrit: 34.8 % — ABNORMAL LOW (ref 37.5–51.0)
Hemoglobin: 11.2 g/dL — ABNORMAL LOW (ref 13.0–17.7)
Immature Grans (Abs): 0 10*3/uL (ref 0.0–0.1)
Immature Granulocytes: 1 %
Lymphocytes Absolute: 0.8 10*3/uL (ref 0.7–3.1)
Lymphs: 11 %
MCH: 30.5 pg (ref 26.6–33.0)
MCHC: 32.2 g/dL (ref 31.5–35.7)
MCV: 95 fL (ref 79–97)
Monocytes Absolute: 1.2 10*3/uL — ABNORMAL HIGH (ref 0.1–0.9)
Monocytes: 16 %
Neutrophils Absolute: 5 10*3/uL (ref 1.4–7.0)
Neutrophils: 69 %
Platelets: 243 10*3/uL (ref 150–450)
RBC: 3.67 x10E6/uL — ABNORMAL LOW (ref 4.14–5.80)
RDW: 13.3 % (ref 11.6–15.4)
WBC: 7.3 10*3/uL (ref 3.4–10.8)

## 2021-08-01 ENCOUNTER — Ambulatory Visit: Payer: Medicare PPO | Admitting: Internal Medicine

## 2021-08-01 ENCOUNTER — Encounter: Payer: Self-pay | Admitting: Internal Medicine

## 2021-08-01 VITALS — BP 120/74 | HR 79 | Ht 71.0 in | Wt 202.0 lb

## 2021-08-01 DIAGNOSIS — I442 Atrioventricular block, complete: Secondary | ICD-10-CM | POA: Diagnosis not present

## 2021-08-01 DIAGNOSIS — Z950 Presence of cardiac pacemaker: Secondary | ICD-10-CM

## 2021-08-01 NOTE — Patient Instructions (Signed)
Medication Instructions:  ?Your physician recommends that you continue on your current medications as directed. Please refer to the Current Medication list given to you today. ? ?Labwork: ?None ordered. ? ?Testing/Procedures: ?None ordered. ? ?Follow-Up: ?Your physician wants you to follow-up in: one year with Lewayne Bunting, MD or one of the following Advanced Practice Providers on your designated Care Team:   ?Francis Dowse, PA-C ?Casimiro Needle "Mardelle Matte" Tovey, PA-C ? ?Remote monitoring is used to monitor your Pacemaker from home. This monitoring reduces the number of office visits required to check your device to one time per year. It allows [place] to keep an eye on the functioning of your device to ensure it is working properly. You are scheduled for a device check from home on 09/27/2021. You may send your transmission at any time that day. If you have a wireless device, the transmission will be sent automatically. After your physician reviews your transmission, you will receive a postcard with your next transmission date. ? ?Any Other Special Instructions Will Be Listed Below (If Applicable). ? ?If you need a refill on your cardiac medications before your next appointment, please call your pharmacy.  ? ?Important Information About Sugar ? ? ? ? ? ? ? ?

## 2021-08-01 NOTE — Progress Notes (Signed)
? ? ? ? ?HPI ?Glenn Collins returns today for followup. He is a pleasant 76 yo man with high grade heart block, now CHB, syncope, and PAD. He has done well in the interim. If he gets in a hurry he will get lightheaded. No chest pain or sob. No edema. He has undergone hip replacement and is slowly progressing. His hip pain is better. ? ?No Known Allergies ? ? ?Current Outpatient Medications  ?Medication Sig Dispense Refill  ? aspirin 81 MG chewable tablet in the morning and at bedtime.    ? atorvastatin (LIPITOR) 80 MG tablet TAKE ONE TABLET BY MOUTH DAILY AT 6PM 90 tablet 0  ? diphenhydrAMINE (BENADRYL) 25 MG tablet Take 25 mg by mouth at bedtime as needed for sleep or allergies.    ? metoprolol succinate (TOPROL XL) 25 MG 24 hr tablet Take 1 tablet (25 mg total) by mouth daily. 90 tablet 1  ? Multiple Vitamin (MULTIVITAMIN WITH MINERALS) TABS tablet Take 1 tablet by mouth daily.    ? nitroGLYCERIN (NITROSTAT) 0.4 MG SL tablet Place 1 tablet (0.4 mg total) under the tongue every 5 (five) minutes x 3 doses as needed for chest pain. 25 tablet 1  ? ondansetron (ZOFRAN) 4 MG tablet Take 1 tablet (4 mg total) by mouth every 6 (six) hours as needed for nausea. 20 tablet 0  ? ramipril (ALTACE) 10 MG capsule Take 1 capsule (10 mg total) by mouth daily. 90 capsule 3  ? RIVAROXABAN (XARELTO) VTE STARTER PACK (15 & 20 MG) Follow package directions: Take one 15mg  tablet by mouth twice a day. On day 22, switch to one 20mg  tablet once a day. Take with food. 51 each 0  ? sodium chloride (OCEAN) 0.65 % SOLN nasal spray Place 1 spray into both nostrils as needed for congestion.    ? ?No current facility-administered medications for this visit.  ? ? ? ?Past Medical History:  ?Diagnosis Date  ? Arthritis   ? CAD (coronary artery disease) 10/27/2018  ? Coronary artery disease   ? Heart murmur Since childhood  ? Hypertension   ? PAD (peripheral artery disease) (HCC) 10/27/2018  ? Presence of permanent cardiac pacemaker   ? Biotronik PPM   ? ? ?ROS: ? ? All systems reviewed and negative except as noted in the HPI. ? ? ?Past Surgical History:  ?Procedure Laterality Date  ? CARDIAC SURGERY  2019  ? with stents x2  ? HERNIA REPAIR  2012  ? PACEMAKER IMPLANT N/A 09/29/2019  ? Procedure: PACEMAKER IMPLANT;  Surgeon: 2013, MD;  Location: [facility] INVASIVE CV LAB;  Service: Cardiovascular;  Laterality: N/A;  ? TOTAL HIP ARTHROPLASTY Right 06/15/2021  ? Procedure: TOTAL HIP ARTHROPLASTY ANTERIOR APPROACH;  Surgeon: [facility], MD;  Location: [facility] ORS;  Service: Orthopedics;  Laterality: Right;  ? ? ? ?Family History  ?Problem Relation Age of Onset  ? Cancer Mother   ? Hearing loss Mother   ? CVA Father   ? ? ? ?Social History  ? ?Socioeconomic History  ? Marital status: Married  ?  Spouse name: Not on file  ? Number of children: Not on file  ? Years of education: Not on file  ? Highest education level: Not on file  ?Occupational History  ? Not on file  ?Tobacco Use  ? Smoking status: Former  ?  Packs/day: 1.50  ?  Years: 40.00  ?  Pack years: 60.00  ?  Types: Cigarettes  ?  Quit date:  10/15/2000  ?  Years since quitting: 20.8  ? Smokeless tobacco: Never  ?Vaping Use  ? Vaping Use: Never used  ?Substance and Sexual Activity  ? Alcohol use: Yes  ?  Alcohol/week: 20.0 standard drinks  ?  Types: 20 Glasses of wine per week  ? Drug use: Never  ? Sexual activity: Not Currently  ?  Birth control/protection: Abstinence  ?Other Topics Concern  ? Not on file  ?Social History Narrative  ? Not on file  ? ?Social Determinants of Health  ? ?Financial Resource Strain: Not on file  ?Food Insecurity: Not on file  ?Transportation Needs: Not on file  ?Physical Activity: Not on file  ?Stress: Not on file  ?Social Connections: Not on file  ?Intimate Partner Violence: Not on file  ? ? ? ?BP 120/74   Pulse 79   Ht 5\' 11"  (1.803 m)   Wt 202 lb (91.6 kg)   SpO2 99%   BMI 28.17 kg/m?  ? ?Physical Exam: ? ?Well appearing NAD ?HEENT: Unremarkable ?Neck:  No JVD, no  thyromegally ?Lymphatics:  No adenopathy ?Back:  No CVA tenderness ?Lungs:  Clear with no wheezes ?HEART:  Regular rate rhythm, no murmurs, no rubs, no clicks ?Abd:  soft, positive bowel sounds, no organomegally, no rebound, no guarding ?Ext:  2 plus pulses, no edema, no cyanosis, no clubbing ?Skin:  No rashes no nodules ?Neuro:  CN II through XII intact, motor grossly intact ? ?DEVICE  ?Normal device function.  See PaceArt for details.  ? ?Assess/Plan:  ?1. CHB - he is asymptomatic, s/p PPM insertion. ?2. PPM - his Biotonik DDD PM is working normally. He has his device reprogrammed to tighten his AV delay. ?3. PAD - he denies claudication. ?  ? Nicanor Mendolia,MD ?

## 2021-08-03 ENCOUNTER — Encounter: Payer: Self-pay | Admitting: Cardiovascular Disease

## 2021-08-03 ENCOUNTER — Ambulatory Visit: Payer: Medicare PPO | Admitting: Cardiovascular Disease

## 2021-08-03 VITALS — BP 120/60 | HR 86 | Ht 71.0 in | Wt 205.0 lb

## 2021-08-03 DIAGNOSIS — R0989 Other specified symptoms and signs involving the circulatory and respiratory systems: Secondary | ICD-10-CM | POA: Diagnosis not present

## 2021-08-03 DIAGNOSIS — I350 Nonrheumatic aortic (valve) stenosis: Secondary | ICD-10-CM | POA: Diagnosis not present

## 2021-08-03 DIAGNOSIS — I10 Essential (primary) hypertension: Secondary | ICD-10-CM | POA: Diagnosis not present

## 2021-08-03 DIAGNOSIS — I739 Peripheral vascular disease, unspecified: Secondary | ICD-10-CM

## 2021-08-03 DIAGNOSIS — E782 Mixed hyperlipidemia: Secondary | ICD-10-CM

## 2021-08-03 DIAGNOSIS — I442 Atrioventricular block, complete: Secondary | ICD-10-CM

## 2021-08-03 DIAGNOSIS — I2510 Atherosclerotic heart disease of native coronary artery without angina pectoris: Secondary | ICD-10-CM | POA: Diagnosis not present

## 2021-08-03 NOTE — Assessment & Plan Note (Signed)
History of essential hypertension blood pressure measured today at 120/60.  He is on metoprolol and ramipril. ?

## 2021-08-03 NOTE — Progress Notes (Signed)
? ? ? ?08/03/2021 ?Glenn Collins   ?[birth year]  ?[id number] ? ?Primary Physician de [place], Raymond J, MD ?Primary Cardiologist: Runell Gess MD Nicholes Calamity, [place] ? ?HPI:  Glenn Collins is a 76 y.o.  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    I last saw him in the office 04/02/2020.  He does have a history of CAD status post RCA and LAD stenting in 2007 with 3 intervention in 2017 of the RCA for in-stent restenosis.  History of hypertension and hyperlipidemia.  She never had a heart attack or stroke.  He denies chest pain or shortness of breath.  He smoked remotely but stopped 20 years ago.  He has a history of claudication which is fairly symmetric and lifestyle limiting especially walking up an incline.  He was evaluated in [place] and told he had "blockages". ?  ?I did begin him on Pletal which has resulted in significant improvement in his claudication symptoms.  He had carotid Dopplers that showed no evidence of ICA stenosis. ?  ?He apparently had several syncopal episodes and was noted to be bradycardic.  His beta-blocker was held but he continued to have bradycardia related syncope and ultimately underwent dual-chamber permanent transvenous pacemaker insertion by Dr. Ladona Ridgel 1 week ago.  His Pletal was discontinued at that time as the plus his beta-blocker.   ?  ?Since I saw him in the office a year and a half ago he continues to do well.  He just had right total hip replacement on March 1 by Dr. Linna Caprice which he is recuperating from.  He does have mild claudication in his calves bilaterally that occurs after 15 or 20 minutes of walking but this is not lifestyle limiting.  Does have known SFA and popliteal disease.  He denies chest pain or shortness of breath. ? ?Current Meds  ?Medication Sig   ? aspirin 81 MG chewable tablet in the morning and at bedtime.  ? atorvastatin (LIPITOR) 80 MG tablet TAKE ONE TABLET BY MOUTH DAILY AT 6PM  ? diphenhydrAMINE (BENADRYL) 25 MG tablet Take 25 mg by mouth at bedtime as needed for sleep or allergies.  ? metoprolol succinate (TOPROL XL) 25 MG 24 hr tablet Take 1 tablet (25 mg total) by mouth daily.  ? Multiple Vitamin (MULTIVITAMIN WITH MINERALS) TABS tablet Take 1 tablet by mouth daily.  ? nitroGLYCERIN (NITROSTAT) 0.4 MG SL tablet Place 1 tablet (0.4 mg total) under the tongue every 5 (five) minutes x 3 doses as needed for chest pain.  ? ondansetron (ZOFRAN) 4 MG tablet Take 1 tablet (4 mg total) by mouth every 6 (six) hours as needed for nausea.  ? ramipril (ALTACE) 10 MG capsule Take 1 capsule (10 mg total) by mouth daily.  ? RIVAROXABAN (XARELTO) VTE STARTER PACK (15 & 20 MG) Follow package directions: Take one 15mg  tablet by mouth twice a day. On day 22, switch to one 20mg  tablet once a day. Take with food.  ? sodium chloride (OCEAN) 0.65 % SOLN nasal spray Place 1 spray into both nostrils as needed for congestion.  ?  ? ?No Known Allergies ? ?Social History  ? ?Socioeconomic History  ? Marital status: Married  ?  Spouse name: Not on file  ? Number of children: Not on file  ? Years of education: Not  on file  ? Highest education level: Not on file  ?Occupational History  ? Not on file  ?Tobacco Use  ? Smoking status: Former  ?  Packs/day: 1.50  ?  Years: 40.00  ?  Pack years: 60.00  ?  Types: Cigarettes  ?  Quit date: 10/15/2000  ?  Years since quitting: 20.8  ? Smokeless tobacco: Never  ?Vaping Use  ? Vaping Use: Never used  ?Substance and Sexual Activity  ? Alcohol use: Yes  ?  Alcohol/week: 20.0 standard drinks  ?  Types: 20 Glasses of wine per week  ? Drug use: Never  ? Sexual activity: Not Currently  ?  Birth control/protection: Abstinence  ?Other Topics Concern  ? Not on file  ?Social History Narrative  ? Not on file  ? ?Social Determinants of Health   ? ?Financial Resource Strain: Not on file  ?Food Insecurity: Not on file  ?Transportation Needs: Not on file  ?Physical Activity: Not on file  ?Stress: Not on file  ?Social Connections: Not on file  ?Intimate Partner Violence: Not on file  ?  ? ?Review of Systems: ?General: negative for chills, fever, night sweats or weight changes.  ?Cardiovascular: negative for chest pain, dyspnea on exertion, edema, orthopnea, palpitations, paroxysmal nocturnal dyspnea or shortness of breath ?Dermatological: negative for rash ?Respiratory: negative for cough or wheezing ?Urologic: negative for hematuria ?Abdominal: negative for nausea, vomiting, diarrhea, bright red blood per rectum, melena, or hematemesis ?Neurologic: negative for visual changes, syncope, or dizziness ?All other systems reviewed and are otherwise negative except as noted above. ? ? ? ?Blood pressure 120/60, pulse 86, height 5\' 11"  (1.803 m), weight 205 lb (93 kg).  ?General appearance: alert and no distress ?Neck: no adenopathy, no JVD, supple, symmetrical, trachea midline, thyroid not enlarged, symmetric, no tenderness/mass/nodules, and bilateral carotid bruits and subclavian bruits ?Lungs: clear to auscultation bilaterally ?Heart: 2/6 outflow tract murmur consistent with aortic stenosis ?Extremities: extremities normal, atraumatic, no cyanosis or edema ?Pulses: Diminished pedal pulses ?Skin: Skin color, texture, turgor normal. No rashes or lesions ?Neurologic: Grossly normal ? ?EKG atrial sensed, ventricular paced rhythm at 86.  I personally reviewed this EKG. ? ?ASSESSMENT AND PLAN:  ? ?PAD (peripheral artery disease) (HCC) ?History of peripheral arterial disease with mild claudication after 15 to 20 minutes of walking in his calves.  He was on Pletal for a while but no longer is.  He had Doppler studies performed in our office 01/07/2019 revealing an occluded right SFA and high-grade left popliteal artery disease.  At this point, given his relative lack of  symptoms I do not feel inclined to further pursue this. ? ?Hypertension ?History of essential hypertension blood pressure measured today at 120/60.  He is on metoprolol and ramipril. ? ?CAD (coronary artery disease) ?History of CAD status post remote stenting of his RCA and LAD in 2007 as well as 2017 in [place].  He has had no intervention since.  He denies chest pain or shortness of breath. ? ?Bilateral carotid bruits ?History of bilateral carotid bruits with carotid Doppler performed 12/28/2020 showing no evidence of ICA stenosis.  He does have turbulent flow in his left subclavian with symmetric upper extremity blood pressures. ? ?Hyperlipidemia ?History of hyperlipidemia on high-dose statin therapy with lipid profile performed 05/12/2021 revealing total cholesterol 161, LDL 82 and HDL 64. ? ?Complete heart block (HCC) ?History of syncope was complete heart block status post permanent transvenous pacemaker implanted by Dr. 05/14/2021 who follows this as an  outpatient.  After pacing his episodes of syncope resolved. ? ?Aortic stenosis ?History of mild to moderate aortic stenosis by 2D echo performed 08/16/2020 with an increase in his mean gradient from 15 to 17 mmHg.  His aortic valve area was calculated at 1.44 cm?.  This will be followed followed on an annual basis. ? ? ? ? ?Runell GessJonathan J. Shandon Burlingame MD FACP,FACC,FAHA, FSCAI ?08/03/2021 ?10:12 AM ?

## 2021-08-03 NOTE — Assessment & Plan Note (Signed)
History of bilateral carotid bruits with carotid Doppler performed 12/28/2020 showing no evidence of ICA stenosis.  He does have turbulent flow in his left subclavian with symmetric upper extremity blood pressures. ?

## 2021-08-03 NOTE — Assessment & Plan Note (Signed)
History of peripheral arterial disease with mild claudication after 15 to 20 minutes of walking in his calves.  He was on Pletal for a while but no longer is.  He had Doppler studies performed in our office 01/07/2019 revealing an occluded right SFA and high-grade left popliteal artery disease.  At this point, given his relative lack of symptoms I do not feel inclined to further pursue this. ?

## 2021-08-03 NOTE — Assessment & Plan Note (Signed)
History of mild to moderate aortic stenosis by 2D echo performed 08/16/2020 with an increase in his mean gradient from 15 to 17 mmHg.  His aortic valve area was calculated at 1.44 cm?.  This will be followed followed on an annual basis. ?

## 2021-08-03 NOTE — Assessment & Plan Note (Signed)
History of CAD status post remote stenting of his RCA and LAD in 2007 as well as 2017 in [place].  He has had no intervention since.  He denies chest pain or shortness of breath. ?

## 2021-08-03 NOTE — Assessment & Plan Note (Signed)
History of hyperlipidemia on high-dose statin therapy with lipid profile performed 05/12/2021 revealing total cholesterol 161, LDL 82 and HDL 64. ?

## 2021-08-03 NOTE — Patient Instructions (Signed)
Medication Instructions:  ?Your physician recommends that you continue on your current medications as directed. Please refer to the Current Medication list given to you today. ? ?*If you need a refill on your cardiac medications before your next appointment, please call your pharmacy* ? ? ?Testing/Procedures: ?Your physician has requested that you have an echocardiogram. Echocardiography is a painless test that uses sound waves to create images of your heart. It provides your doctor with information about the size and shape of your heart and how well your heart?s chambers and valves are working. This procedure takes approximately one hour. There are no restrictions for this procedure. To do in May. This procedure will be done at [address] ? ?Your physician has requested that you have a carotid duplex. This test is an ultrasound of the carotid arteries in your neck. It looks at blood flow through these arteries that supply the brain with blood. Allow one hour for this exam. There are no restrictions or special instructions. To do in Sept. This procedure will be done at [address] ? ?Your physician has requested that you have a lower extremity arterial duplex. This test is an ultrasound of the arteries in the legs. It looks at arterial blood flow in the legs. Allow one hour for Lower Arterial scans. There are no restrictions or special instructions. To do in April 2024. This procedure will be done at [address] [facility] [address] [address] ? ?Your physician has requested that you have an ankle brachial index (ABI). During this test an ultrasound and blood pressure cuff are used to evaluate the arteries that supply the arms and legs with blood. Allow thirty minutes for this exam. There are no restrictions or special instructions. To do in April 2024. This procedure will be done at [address] [facility] ? ? ? ?Follow-Up: ?At [facility], you and your health needs are our priority.  As part  of our continuing mission to provide you with exceptional heart care, we have created designated Provider Care Teams.  These Care Teams include your primary Cardiologist (physician) and Advanced Practice Providers (APPs -  Physician Assistants and Nurse Practitioners) who all work together to provide you with the care you need, when you need it. ? ?We recommend signing up for the patient portal called "MyChart".  Sign up information is provided on this After Visit Summary.  MyChart is used to connect with patients for Virtual Visits (Telemedicine).  Patients are able to view lab/test results, encounter notes, upcoming appointments, etc.  Non-urgent messages can be sent to your provider as well.   ?To learn more about what you can do with MyChart, go to [email or web address].   ? ?Your next appointment:   ?12 month(s) ? ?The format for your next appointment:   ?In Person ? ?Provider:   ?Nanetta Batty, MD  ?

## 2021-08-03 NOTE — Assessment & Plan Note (Signed)
History of syncope was complete heart block status post permanent transvenous pacemaker implanted by Dr. Lovena Le who follows this as an outpatient.  After pacing his episodes of syncope resolved. ?

## 2021-08-05 ENCOUNTER — Other Ambulatory Visit (HOSPITAL_BASED_OUTPATIENT_CLINIC_OR_DEPARTMENT_OTHER): Payer: Self-pay | Admitting: Family Medicine

## 2021-08-05 ENCOUNTER — Other Ambulatory Visit (HOSPITAL_BASED_OUTPATIENT_CLINIC_OR_DEPARTMENT_OTHER): Payer: Self-pay

## 2021-08-05 DIAGNOSIS — I82451 Acute embolism and thrombosis of right peroneal vein: Secondary | ICD-10-CM

## 2021-08-05 MED ORDER — RIVAROXABAN 20 MG PO TABS
20.0000 mg | ORAL_TABLET | Freq: Every day | ORAL | 1 refills | Status: DC
  Filled 2021-08-05: qty 30, 30d supply, fill #0
  Filled 2021-09-05: qty 30, 30d supply, fill #1

## 2021-08-10 DIAGNOSIS — Z4789 Encounter for other orthopedic aftercare: Secondary | ICD-10-CM | POA: Diagnosis not present

## 2021-08-10 DIAGNOSIS — Z471 Aftercare following joint replacement surgery: Secondary | ICD-10-CM | POA: Diagnosis not present

## 2021-08-10 DIAGNOSIS — Z96641 Presence of right artificial hip joint: Secondary | ICD-10-CM | POA: Diagnosis not present

## 2021-08-11 ENCOUNTER — Ambulatory Visit (HOSPITAL_BASED_OUTPATIENT_CLINIC_OR_DEPARTMENT_OTHER): Payer: Medicare PPO | Admitting: Family Medicine

## 2021-08-11 ENCOUNTER — Encounter (HOSPITAL_BASED_OUTPATIENT_CLINIC_OR_DEPARTMENT_OTHER): Payer: Self-pay | Admitting: Family Medicine

## 2021-08-11 VITALS — BP 142/72 | HR 84 | Ht 71.0 in | Wt 203.4 lb

## 2021-08-11 DIAGNOSIS — I82409 Acute embolism and thrombosis of unspecified deep veins of unspecified lower extremity: Secondary | ICD-10-CM | POA: Insufficient documentation

## 2021-08-11 DIAGNOSIS — I82461 Acute embolism and thrombosis of right calf muscular vein: Secondary | ICD-10-CM

## 2021-08-11 NOTE — Progress Notes (Signed)
? ? ?  Procedures performed today:   ? ?None. ? ?Independent interpretation of notes and tests performed by another provider:  ? ?None. ? ?Brief History, Exam, Impression, and Recommendations:   ? ?BP (!) 142/72   Pulse 84   Ht 5\' 11"  (1.803 m)   Wt 203 lb 6.4 oz (92.3 kg)   SpO2 100%   BMI 28.37 kg/m?  ? ?DVT (deep venous thrombosis) (HCC) ?Patient is presenting for follow-up of recently diagnosed DVT.  He has been doing well with Xarelto.  He had been recommended to be taking baby aspirin twice daily by his orthopedic surgeon, however indicates that his surgeon has now told him he does not need to continue with this.  I also recommend that he discontinue the baby aspirin given that he is on Xarelto currently for treatment of his DVT ?He feels that right lower extremity swelling and discomfort have improved, although still has some each present. ?He denies any issues with bleeding, no bleeding gums, no nosebleeds, no blood in urine or stool.  Will have some increased bruising but feels that this is mild ?On exam, patient does have some trace pitting edema in right lower extremity, mild tenderness over calf ?Discussed that for provoked DVT, treatment will be 3 months of anticoagulation with Xarelto ?We will have him discontinue use baby aspirin ?Plan for follow-up in 2 months to monitor progress with Xarelto and clinical response or sooner as needed ? ? ?___________________________________________ ?Kayle Passarelli de , MD, ABFM, CAQSM ?Primary Care and Sports Medicine ? [facility] ?

## 2021-08-11 NOTE — Assessment & Plan Note (Signed)
Patient is presenting for follow-up of recently diagnosed DVT.  He has been doing well with Xarelto.  He had been recommended to be taking baby aspirin twice daily by his orthopedic surgeon, however indicates that his surgeon has now told him he does not need to continue with this.  I also recommend that he discontinue the baby aspirin given that he is on Xarelto currently for treatment of his DVT ?He feels that right lower extremity swelling and discomfort have improved, although still has some each present. ?He denies any issues with bleeding, no bleeding gums, no nosebleeds, no blood in urine or stool.  Will have some increased bruising but feels that this is mild ?On exam, patient does have some trace pitting edema in right lower extremity, mild tenderness over calf ?Discussed that for provoked DVT, treatment will be 3 months of anticoagulation with Xarelto ?We will have him discontinue use baby aspirin ?Plan for follow-up in 2 months to monitor progress with Xarelto and clinical response or sooner as needed ?

## 2021-08-19 ENCOUNTER — Ambulatory Visit: Payer: Medicare PPO | Attending: Internal Medicine

## 2021-08-19 DIAGNOSIS — Z23 Encounter for immunization: Secondary | ICD-10-CM

## 2021-08-22 ENCOUNTER — Other Ambulatory Visit (HOSPITAL_BASED_OUTPATIENT_CLINIC_OR_DEPARTMENT_OTHER): Payer: Self-pay

## 2021-08-22 MED ORDER — MODERNA COVID-19 BIVAL BOOSTER 50 MCG/0.5ML IM SUSP
INTRAMUSCULAR | 0 refills | Status: DC
Start: 2021-08-22 — End: 2022-08-29
  Filled 2021-08-22: qty 0.5, 1d supply, fill #0

## 2021-08-22 NOTE — Progress Notes (Signed)
? ?  Covid-19 Vaccination [facility] ? ?Name:  Glenn Collins    ?MRN: [id number] ?DOB: March 24, 1946 ? ?08/22/2021 ? ?[name] was observed post Covid-19 immunization for 15 minutes without incident. He was provided with Vaccine Information Sheet and instruction to access the V-Safe system.  ? ?[name] was instructed to call 911 with any severe reactions post vaccine: ?Difficulty breathing  ?Swelling of face and throat  ?A fast heartbeat  ?A bad rash all over body  ?Dizziness and weakness  ? ?Immunizations Administered   ? ? Name Date Dose VIS Date Route  ? Moderna Covid-19 vaccine Bivalent Booster 08/19/2021 11:56 AM 0.5 mL 11/27/2020 Intramuscular  ? Manufacturer: Moderna  ? Lot: [id number]  ? NDC: 80777-282-99  ? ?  ? ? ?

## 2021-08-26 ENCOUNTER — Ambulatory Visit (INDEPENDENT_AMBULATORY_CARE_PROVIDER_SITE_OTHER): Payer: Medicare PPO

## 2021-08-26 ENCOUNTER — Encounter (HOSPITAL_BASED_OUTPATIENT_CLINIC_OR_DEPARTMENT_OTHER): Payer: Self-pay

## 2021-08-26 DIAGNOSIS — Z0000 Encounter for general adult medical examination without abnormal findings: Secondary | ICD-10-CM | POA: Diagnosis not present

## 2021-08-26 NOTE — Progress Notes (Signed)
? ?Subjective:  ? Glenn Collins is a 76 y.o. male who presents for an Initial Medicare Annual Wellness Visit. ?I connected with  Glenn Collins on 08/26/21 by a video and audio enabled telemedicine application and verified that I am speaking with the correct person using two identifiers. ? ?Patient Location: Home ? ?Provider Location: Home Office ? ?I discussed the limitations of evaluation and management by telemedicine. The patient expressed understanding and agreed to proceed.  ?   ?Objective:  ?  ?Today's Vitals  ? ?There is no height or weight on file to calculate BMI. ? ? ?  06/15/2021  ?  1:18 PM 06/08/2021  ?  2:12 PM 12/29/2020  ?  2:29 PM 10/06/2019  ?  7:17 PM 09/28/2019  ? 10:15 PM 09/22/2019  ? 12:56 AM 09/08/2019  ?  8:06 PM  ?Advanced Directives  ?Does Patient Have a Medical Advance Directive? No No No No No No No  ?Would patient like information on creating a medical advance directive? No - Patient declined No - Patient declined  No - Patient declined No - Patient declined No - Patient declined No - Patient declined  ? ? ?Current Medications (verified) ?Outpatient Encounter Medications as of 08/26/2021  ?Medication Sig  ? atorvastatin (LIPITOR) 80 MG tablet TAKE ONE TABLET BY MOUTH DAILY AT 6PM  ? COVID-19 mRNA bivalent vaccine, Moderna, (MODERNA COVID-19 BIVAL BOOSTER) 50 MCG/0.5ML injection Inject into the muscle.  ? diphenhydrAMINE (BENADRYL) 25 MG tablet Take 25 mg by mouth at bedtime as needed for sleep or allergies.  ? metoprolol succinate (TOPROL XL) 25 MG 24 hr tablet Take 1 tablet (25 mg total) by mouth daily.  ? ramipril (ALTACE) 10 MG capsule Take 1 capsule (10 mg total) by mouth daily.  ? rivaroxaban (XARELTO) 20 MG TABS tablet Take 1 tablet (20 mg total) by mouth daily with supper.  ? sodium chloride (OCEAN) 0.65 % SOLN nasal spray Place 1 spray into both nostrils as needed for congestion.  ? Multiple Vitamin (MULTIVITAMIN WITH MINERALS) TABS tablet Take 1 tablet by mouth daily. (Patient not  taking: Reported on 08/26/2021)  ? nitroGLYCERIN (NITROSTAT) 0.4 MG SL tablet Place 1 tablet (0.4 mg total) under the tongue every 5 (five) minutes x 3 doses as needed for chest pain. (Patient not taking: Reported on 08/26/2021)  ? ondansetron (ZOFRAN) 4 MG tablet Take 1 tablet (4 mg total) by mouth every 6 (six) hours as needed for nausea. (Patient not taking: Reported on 08/26/2021)  ? ?No facility-administered encounter medications on file as of 08/26/2021.  ? ? ?Allergies (verified) ?Patient has no known allergies.  ? ?History: ?Past Medical History:  ?Diagnosis Date  ? Arthritis   ? CAD (coronary artery disease) 10/27/2018  ? Coronary artery disease   ? Heart murmur Since childhood  ? Hypertension   ? PAD (peripheral artery disease) ([place]) 10/27/2018  ? Presence of permanent cardiac pacemaker   ? Biotronik PPM  ? ?Past Surgical History:  ?Procedure Laterality Date  ? CARDIAC SURGERY  2019  ? with stents x2  ? HERNIA REPAIR  2012  ? PACEMAKER IMPLANT N/A 09/29/2019  ? Procedure: PACEMAKER IMPLANT;  Surgeon: Evans Lance, MD;  Location: [facility];  Service: Cardiovascular;  Laterality: N/A;  ? TOTAL HIP ARTHROPLASTY Right 06/15/2021  ? Procedure: TOTAL HIP ARTHROPLASTY ANTERIOR APPROACH;  Surgeon: Rod Can, MD;  Location: [facility];  Service: Orthopedics;  Laterality: Right;  ? ?Family History  ?Problem Relation Age of Onset  ?  Cancer Mother   ? Hearing loss Mother   ? CVA Father   ? ?Social History  ? ?Socioeconomic History  ? Marital status: Married  ?  Spouse name: Not on file  ? Number of children: Not on file  ? Years of education: Not on file  ? Highest education level: Not on file  ?Occupational History  ? Not on file  ?Tobacco Use  ? Smoking status: Former  ?  Packs/day: 1.50  ?  Years: 40.00  ?  Pack years: 60.00  ?  Types: Cigarettes  ?  Quit date: 10/15/2000  ?  Years since quitting: 20.8  ? Smokeless tobacco: Never  ?Vaping Use  ? Vaping Use: Never used  ?Substance and Sexual Activity  ?  Alcohol use: Yes  ?  Alcohol/week: 20.0 standard drinks  ?  Types: 20 Glasses of wine per week  ? Drug use: Never  ? Sexual activity: Not Currently  ?  Birth control/protection: Abstinence  ?Other Topics Concern  ? Not on file  ?Social History Narrative  ? Not on file  ? ?Social Determinants of Health  ? ?Financial Resource Strain: Low Risk   ? Difficulty of Paying Living Expenses: Not hard at all  ?Food Insecurity: No Food Insecurity  ? Worried About Charity fundraiser in the Last Year: Never true  ? Ran Out of Food in the Last Year: Never true  ?Transportation Needs: No Transportation Needs  ? Lack of Transportation (Medical): No  ? Lack of Transportation (Non-Medical): No  ?Physical Activity: Insufficiently Active  ? Days of Exercise per Week: 3 days  ? Minutes of Exercise per Session: 20 min  ?Stress: No Stress Concern Present  ? Feeling of Stress : Not at all  ?Social Connections: Socially Isolated  ? Frequency of Communication with Friends and Family: Never  ? Frequency of Social Gatherings with Friends and Family: Never  ? Attends Religious Services: Never  ? Active Member of Clubs or Organizations: No  ? Attends Archivist Meetings: Never  ? Marital Status: Married  ? ? ?Tobacco Counseling ?Counseling given: Yes ? ? ?Clinical Intake: ? ?Pre-visit preparation completed: Yes ? ?Pain : No/denies pain ? ?  ? ?Diabetes: No ? ?How often do you need to have someone help you when you read instructions, pamphlets, or other written materials from your doctor or pharmacy?: 1 - Never ?What is the last grade level you completed in school?: PHD ? ?Diabetic?no  ? ?Interpreter Needed?: No ? ?  ? ? ?Activities of Daily Living ? ?  08/26/2021  ? 12:57 PM 08/11/2021  ?  1:10 PM  ?In your present state of health, do you have any difficulty performing the following activities:  ?Hearing? 0 0  ?Vision? 0 1  ?Difficulty concentrating or making decisions? 0 0  ?Walking or climbing stairs? 1 0  ?Dressing or bathing? 0 0   ?Doing errands, shopping? 0 0  ? ? ?Patient Care Team: ?[place], Blondell Reveal, MD as PCP - General (Family Medicine) ?Lorretta Harp, MD as PCP - Cardiology (Cardiology) ? ?Indicate any recent Medical Services you may have received from other than [facility] providers in the past year (date may be approximate). ? ?   ?Assessment:  ? This is a routine wellness examination for Ameet. ? ?Hearing/Vision screen ?No results found. ? ?Dietary issues and exercise activities discussed: ?  ? ? Goals Addressed   ?None ?  ?Depression Screen ? ?  08/26/2021  ?  12:53 PM 08/11/2021  ?  1:10 PM 07/08/2021  ? 10:39 AM 12/23/2020  ? 11:32 AM  ?PHQ 2/9 Scores  ?PHQ - 2 Score 0 0 1 0  ?Exception Documentation  Medical reason Medical reason   ?  ?Fall Risk ? ?  08/26/2021  ? 12:57 PM 08/11/2021  ?  1:10 PM 07/08/2021  ? 10:38 AM  ?Fall Risk   ?Falls in the past year? 0 0 0  ?Number falls in past yr: 0 0 0  ?Injury with Fall? 0 0 0  ?Risk for fall due to : No Fall Risks No Fall Risks No Fall Risks  ?Follow up Falls evaluation completed Falls evaluation completed   ? ? ?FALL RISK PREVENTION PERTAINING TO THE HOME: ? ?Any stairs in or around the home? Yes  ?If so, are there any without handrails? No  ?Home free of loose throw rugs in walkways, pet beds, electrical cords, etc? Yes  ?Adequate lighting in your home to reduce risk of falls? Yes  ? ?ASSISTIVE DEVICES UTILIZED TO PREVENT FALLS: ? ?Life alert? No  ?Use of a cane, walker or w/c? Yes  ?Grab bars in the bathroom? No  ?Shower chair or bench in shower? Yes  ?Elevated toilet seat or a handicapped toilet? Yes  ? ? ?Cognitive Function: ?  ?  ? ?  08/26/2021  ? 12:58 PM  ?6CIT Screen  ?What Year? 0 points  ?What time? 0 points  ?Count back from 20 0 points  ?Months in reverse 0 points  ?Repeat phrase 2 points  ? ? ?Immunizations ?Immunization History  ?Administered Date(s) Administered  ? Fluad Quad(high Dose 65+) 02/10/2021  ? Moderna Covid-19 Vaccine Bivalent Booster 68yrs & up 02/10/2021,  08/19/2021  ? Moderna SARS-COV2 Booster Vaccination 10/05/2020  ? PFIZER(Purple Top)SARS-COV-2 Vaccination 05/25/2019, 06/19/2019, 01/31/2020  ? Pneumococcal Conjugate-13 03/14/2021  ? Tdap 08/28/2015  ? ? ?TDAP status:

## 2021-08-26 NOTE — Patient Instructions (Signed)
Health Maintenance, Male Adopting a healthy lifestyle and getting preventive care are important in promoting health and wellness. Ask your health care provider about: The right schedule for you to have regular tests and exams. Things you can do on your own to prevent diseases and keep yourself healthy. What should I know about diet, weight, and exercise? Eat a healthy diet  Eat a diet that includes plenty of vegetables, fruits, low-fat dairy products, and lean protein. Do not eat a lot of foods that are high in solid fats, added sugars, or sodium. Maintain a healthy weight Body mass index (BMI) is a measurement that can be used to identify possible weight problems. It estimates body fat based on height and weight. Your health care provider can help determine your BMI and help you achieve or maintain a healthy weight. Get regular exercise Get regular exercise. This is one of the most important things you can do for your health. Most adults should: Exercise for at least 150 minutes each week. The exercise should increase your heart rate and make you sweat (moderate-intensity exercise). Do strengthening exercises at least twice a week. This is in addition to the moderate-intensity exercise. Spend less time sitting. Even light physical activity can be beneficial. Watch cholesterol and blood lipids Have your blood tested for lipids and cholesterol at 76 years of age, then have this test every 5 years. You may need to have your cholesterol levels checked more often if: Your lipid or cholesterol levels are high. You are older than 76 years of age. You are at high risk for heart disease. What should I know about cancer screening? Many types of cancers can be detected early and may often be prevented. Depending on your health history and family history, you may need to have cancer screening at various ages. This may include screening for: Colorectal cancer. Prostate cancer. Skin cancer. Lung  cancer. What should I know about heart disease, diabetes, and high blood pressure? Blood pressure and heart disease High blood pressure causes heart disease and increases the risk of stroke. This is more likely to develop in people who have high blood pressure readings or are overweight. Talk with your health care provider about your target blood pressure readings. Have your blood pressure checked: Every 3-5 years if you are 18-39 years of age. Every year if you are 40 years old or older. If you are between the ages of 65 and 75 and are a current or former smoker, ask your health care provider if you should have a one-time screening for abdominal aortic aneurysm (AAA). Diabetes Have regular diabetes screenings. This checks your fasting blood sugar level. Have the screening done: Once every three years after age 45 if you are at a normal weight and have a low risk for diabetes. More often and at a younger age if you are overweight or have a high risk for diabetes. What should I know about preventing infection? Hepatitis B If you have a higher risk for hepatitis B, you should be screened for this virus. Talk with your health care provider to find out if you are at risk for hepatitis B infection. Hepatitis C Blood testing is recommended for: Everyone born from 1945 through 1965. Anyone with known risk factors for hepatitis C. Sexually transmitted infections (STIs) You should be screened each year for STIs, including gonorrhea and chlamydia, if: You are sexually active and are younger than 76 years of age. You are older than 76 years of age and your   health care provider tells you that you are at risk for this type of infection. Your sexual activity has changed since you were last screened, and you are at increased risk for chlamydia or gonorrhea. Ask your health care provider if you are at risk. Ask your health care provider about whether you are at high risk for HIV. Your health care provider  may recommend a prescription medicine to help prevent HIV infection. If you choose to take medicine to prevent HIV, you should first get tested for HIV. You should then be tested every 3 months for as long as you are taking the medicine. Follow these instructions at home: Alcohol use Do not drink alcohol if your health care provider tells you not to drink. If you drink alcohol: Limit how much you have to 0-2 drinks a day. Know how much alcohol is in your drink. In the [place] one drink equals one 12 oz bottle of beer (355 mL), one 5 oz glass of wine (148 mL), or one 1 oz glass of hard liquor (44 mL). Lifestyle Do not use any products that contain nicotine or tobacco. These products include cigarettes, chewing tobacco, and vaping devices, such as e-cigarettes. If you need help quitting, ask your health care provider. Do not use street drugs. Do not share needles. Ask your health care provider for help if you need support or information about quitting drugs. General instructions Schedule regular health, dental, and eye exams. Stay current with your vaccines. Tell your health care provider if: You often feel depressed. You have ever been abused or do not feel safe at home. Summary Adopting a healthy lifestyle and getting preventive care are important in promoting health and wellness. Follow your health care provider's instructions about healthy diet, exercising, and getting tested or screened for diseases. Follow your health care provider's instructions on monitoring your cholesterol and blood pressure. This information is not intended to replace advice given to you by your health care provider. Make sure you discuss any questions you have with your health care provider. Document Revised: 08/23/2020 Document Reviewed: 08/23/2020 Elsevier Patient Education  2023 Elsevier Inc.  

## 2021-08-31 ENCOUNTER — Ambulatory Visit (HOSPITAL_COMMUNITY): Payer: Medicare PPO | Attending: Cardiology

## 2021-08-31 DIAGNOSIS — I10 Essential (primary) hypertension: Secondary | ICD-10-CM | POA: Insufficient documentation

## 2021-08-31 DIAGNOSIS — I350 Nonrheumatic aortic (valve) stenosis: Secondary | ICD-10-CM | POA: Diagnosis not present

## 2021-08-31 LAB — ECHOCARDIOGRAM COMPLETE
AR max vel: 1.15 cm2
AV Area VTI: 1.37 cm2
AV Area mean vel: 1.03 cm2
AV Mean grad: 25 mmHg
AV Peak grad: 42.8 mmHg
Ao pk vel: 3.27 m/s
Area-P 1/2: 9.73 cm2
P 1/2 time: 349 msec
S' Lateral: 3.5 cm

## 2021-09-05 ENCOUNTER — Other Ambulatory Visit (HOSPITAL_BASED_OUTPATIENT_CLINIC_OR_DEPARTMENT_OTHER): Payer: Self-pay

## 2021-09-27 ENCOUNTER — Ambulatory Visit (INDEPENDENT_AMBULATORY_CARE_PROVIDER_SITE_OTHER): Payer: Medicare PPO

## 2021-09-27 DIAGNOSIS — I442 Atrioventricular block, complete: Secondary | ICD-10-CM

## 2021-09-27 LAB — CUP PACEART REMOTE DEVICE CHECK
Date Time Interrogation Session: 20230613101534
Implantable Lead Implant Date: 20210614
Implantable Lead Implant Date: 20210614
Implantable Lead Location: 753859
Implantable Lead Location: 753860
Implantable Lead Model: 377171
Implantable Lead Model: 377171
Implantable Lead Serial Number: 7000244971
Implantable Lead Serial Number: 7000271886
Implantable Pulse Generator Implant Date: 20210614
Pulse Gen Model: 407145
Pulse Gen Serial Number: 69870190

## 2021-10-02 ENCOUNTER — Other Ambulatory Visit (HOSPITAL_BASED_OUTPATIENT_CLINIC_OR_DEPARTMENT_OTHER): Payer: Self-pay | Admitting: Family Medicine

## 2021-10-02 DIAGNOSIS — I82451 Acute embolism and thrombosis of right peroneal vein: Secondary | ICD-10-CM

## 2021-10-03 ENCOUNTER — Other Ambulatory Visit (HOSPITAL_BASED_OUTPATIENT_CLINIC_OR_DEPARTMENT_OTHER): Payer: Self-pay

## 2021-10-03 MED ORDER — RIVAROXABAN 20 MG PO TABS
20.0000 mg | ORAL_TABLET | Freq: Every day | ORAL | 1 refills | Status: DC
  Filled 2021-10-03: qty 30, 30d supply, fill #0

## 2021-10-10 ENCOUNTER — Encounter (HOSPITAL_BASED_OUTPATIENT_CLINIC_OR_DEPARTMENT_OTHER): Payer: Self-pay | Admitting: Family Medicine

## 2021-10-10 ENCOUNTER — Ambulatory Visit (INDEPENDENT_AMBULATORY_CARE_PROVIDER_SITE_OTHER): Payer: Medicare PPO | Admitting: Family Medicine

## 2021-10-10 VITALS — BP 134/65 | HR 82 | Ht 71.0 in | Wt 204.5 lb

## 2021-10-10 DIAGNOSIS — I82461 Acute embolism and thrombosis of right calf muscular vein: Secondary | ICD-10-CM

## 2021-10-10 NOTE — Assessment & Plan Note (Signed)
Patient continues with Xarelto, denies any issues of bleeding, no blood in stool, urine, no nosebleeds, no bleeding gums.  He still has some soreness over the right posterior calf, more so with direct pressure over the area.  Still with some mild swelling in right lower extremity.  He is about 3 months now since initial diagnosis of DVT and treatment with Xarelto On exam, there is some mild tenderness to palpation over right posterior calf, trace pitting edema in distal aspect of right lower extremity.  Patient is wearing compression stockings today. Discussed options with patient, at this time we will continue with Xarelto and plan for ultrasound to reassess DVT given that he is still having some symptoms Further treatment recommendations pending results of ultrasound.  If DVT persists at same size or has progressed, will refer to hematology for further recommendations.  If DVT is resolving but still present, would consider extending duration of anticoagulation a bit longer and monitoring clinically Order for ultrasound placed today

## 2021-10-11 ENCOUNTER — Telehealth: Payer: Self-pay | Admitting: Cardiovascular Disease

## 2021-10-11 MED ORDER — ATORVASTATIN CALCIUM 80 MG PO TABS: ORAL_TABLET | ORAL | 2 refills | Status: DC

## 2021-10-11 NOTE — Telephone Encounter (Signed)
Returned call to patient who verbalizes delays in refilling his medication.  He states he has this issue with Dr. Hazle Coca office each time he needs a refill.  Reviewed patient chart, as per patient refilled atovastatin as requested.  Refill approval sent to [facility] with 2 additional refills. Will follow up with staff to identify any questions regarding patients refill requests.  Jim Like MHA RN CCM

## 2021-10-12 NOTE — Progress Notes (Signed)
Remote pacemaker transmission.   

## 2021-10-20 ENCOUNTER — Ambulatory Visit (HOSPITAL_BASED_OUTPATIENT_CLINIC_OR_DEPARTMENT_OTHER)
Admission: RE | Admit: 2021-10-20 | Discharge: 2021-10-20 | Disposition: A | Discharge status: Home / Self Care | Payer: Medicare PPO | Source: Ambulatory Visit | Attending: Family Medicine | Admitting: Family Medicine

## 2021-10-20 DIAGNOSIS — Z0389 Encounter for observation for other suspected diseases and conditions ruled out: Secondary | ICD-10-CM | POA: Diagnosis not present

## 2021-10-20 DIAGNOSIS — I82461 Acute embolism and thrombosis of right calf muscular vein: Secondary | ICD-10-CM | POA: Insufficient documentation

## 2021-12-20 ENCOUNTER — Ambulatory Visit (HOSPITAL_COMMUNITY)
Admission: RE | Admit: 2021-12-20 | Discharge: 2021-12-20 | Disposition: A | Discharge status: Home / Self Care | Payer: Medicare PPO | Source: Ambulatory Visit | Attending: Cardiology | Admitting: Cardiology

## 2021-12-20 DIAGNOSIS — I6523 Occlusion and stenosis of bilateral carotid arteries: Secondary | ICD-10-CM | POA: Diagnosis not present

## 2021-12-27 ENCOUNTER — Ambulatory Visit (INDEPENDENT_AMBULATORY_CARE_PROVIDER_SITE_OTHER): Payer: Medicare PPO

## 2021-12-27 DIAGNOSIS — I442 Atrioventricular block, complete: Secondary | ICD-10-CM

## 2021-12-30 ENCOUNTER — Other Ambulatory Visit: Payer: Self-pay

## 2021-12-30 LAB — CUP PACEART REMOTE DEVICE CHECK
Date Time Interrogation Session: 20230912112326
Implantable Lead Implant Date: 20210614
Implantable Lead Implant Date: 20210614
Implantable Lead Location: 753859
Implantable Lead Location: 753860
Implantable Lead Model: 377171
Implantable Lead Model: 377171
Implantable Lead Serial Number: 7000244971
Implantable Lead Serial Number: 7000271886
Implantable Pulse Generator Implant Date: 20210614
Pulse Gen Model: 407145
Pulse Gen Serial Number: 69870190

## 2021-12-30 MED ORDER — METOPROLOL SUCCINATE ER 25 MG PO TB24: 25.0000 mg | ORAL_TABLET | Freq: Every day | ORAL | 1 refills | Status: DC

## 2022-01-10 ENCOUNTER — Ambulatory Visit (INDEPENDENT_AMBULATORY_CARE_PROVIDER_SITE_OTHER): Payer: Medicare PPO | Admitting: Family Medicine

## 2022-01-10 ENCOUNTER — Encounter (HOSPITAL_BASED_OUTPATIENT_CLINIC_OR_DEPARTMENT_OTHER): Payer: Self-pay | Admitting: Family Medicine

## 2022-01-10 DIAGNOSIS — I10 Essential (primary) hypertension: Secondary | ICD-10-CM | POA: Diagnosis not present

## 2022-01-10 DIAGNOSIS — I82461 Acute embolism and thrombosis of right calf muscular vein: Secondary | ICD-10-CM | POA: Diagnosis not present

## 2022-01-10 DIAGNOSIS — R7309 Other abnormal glucose: Secondary | ICD-10-CM

## 2022-01-10 DIAGNOSIS — E782 Mixed hyperlipidemia: Secondary | ICD-10-CM

## 2022-01-10 DIAGNOSIS — J3089 Other allergic rhinitis: Secondary | ICD-10-CM

## 2022-01-10 NOTE — Assessment & Plan Note (Signed)
Blood pressure adequately controlled in office today.  He continues with metoprolol and ramipril.  No current concerns with chest pain or headaches At this time, can continue with current medications Recommend intermittent monitoring at home, DASH diet

## 2022-01-10 NOTE — Assessment & Plan Note (Addendum)
After last visit, patient had venous ultrasound completed which showed no evidence of DVT at that time with normal venous flow and normal compressibility of vein.  Due to findings, anticoagulant was discontinued.  He reports that he has not had any calf pain, no longer having any lower extremity swelling either No issues with bleeding On exam, no tenderness to palpation through right calf, no edema noted within the right lower extremity At this time, can continue with monitoring with no further evaluation needed currently

## 2022-01-10 NOTE — Progress Notes (Signed)
    Procedures performed today:    None.  Independent interpretation of notes and tests performed by another provider:   None.  Brief History, Exam, Impression, and Recommendations:    BP 106/67   Pulse 70   Ht 5\' 11"  (1.803 m)   Wt 207 lb 4.8 oz (94 kg)   SpO2 100%   BMI 28.91 kg/m   DVT (deep venous thrombosis) (HCC) After last visit, patient had venous ultrasound completed which showed no evidence of DVT at that time with normal venous flow and normal compressibility of vein.  Due to findings, anticoagulant was discontinued.  He reports that he has not had any calf pain, no longer having any lower extremity swelling either No issues with bleeding On exam, no tenderness to palpation through right calf, no edema noted within the right lower extremity At this time, can continue with monitoring with no further evaluation needed currently  Hypertension Blood pressure adequately controlled in office today.  He continues with metoprolol and ramipril.  No current concerns with chest pain or headaches At this time, can continue with current medications Recommend intermittent monitoring at home, DASH diet  Environmental and seasonal allergies He does report occasional issues with seasonal allergies.  Reports having some rhinitis, rhinorrhea.  In the past he has generally used nasal saline spray and Benadryl as needed Today we discussed additional treatment considerations including second-generation oral antihistamines, intranasal steroid spray to be used in conjunction with nasal saline spray.  Discussed proper administration of medications and nasal sprays.  These medications can be used as needed or can be used on a daily basis when symptoms are more problematic  Return in about 4 months (around 05/12/2022) for HTN, HLD, labs.   ___________________________________________ Denisa Enterline de [place], MD, ABFM, CAQSM Primary Care and [place]

## 2022-01-10 NOTE — Assessment & Plan Note (Signed)
He does report occasional issues with seasonal allergies.  Reports having some rhinitis, rhinorrhea.  In the past he has generally used nasal saline spray and Benadryl as needed Today we discussed additional treatment considerations including second-generation oral antihistamines, intranasal steroid spray to be used in conjunction with nasal saline spray.  Discussed proper administration of medications and nasal sprays.  These medications can be used as needed or can be used on a daily basis when symptoms are more problematic

## 2022-01-10 NOTE — Patient Instructions (Signed)

## 2022-01-12 NOTE — Progress Notes (Signed)
Remote pacemaker transmission.   

## 2022-02-08 ENCOUNTER — Other Ambulatory Visit (HOSPITAL_BASED_OUTPATIENT_CLINIC_OR_DEPARTMENT_OTHER): Payer: Self-pay

## 2022-02-08 MED ORDER — INFLUENZA VAC A&B SA ADJ QUAD 0.5 ML IM PRSY
PREFILLED_SYRINGE | INTRAMUSCULAR | 0 refills | Status: DC
  Filled 2022-02-08: qty 0.5, 1d supply, fill #0

## 2022-02-08 MED ORDER — COMIRNATY 30 MCG/0.3ML IM SUSY
PREFILLED_SYRINGE | INTRAMUSCULAR | 0 refills | Status: DC
  Filled 2022-02-08: qty 0.3, 1d supply, fill #0

## 2022-03-04 ENCOUNTER — Encounter (HOSPITAL_COMMUNITY): Payer: Self-pay

## 2022-03-04 ENCOUNTER — Ambulatory Visit (HOSPITAL_COMMUNITY)
Admission: RE | Admit: 2022-03-04 | Discharge: 2022-03-04 | Discharge status: Against Medical Advice | Payer: Medicare PPO | Source: Ambulatory Visit | Attending: Family Medicine | Admitting: Family Medicine

## 2022-03-04 DIAGNOSIS — Z5321 Procedure and treatment not carried out due to patient leaving prior to being seen by health care provider: Secondary | ICD-10-CM

## 2022-03-04 NOTE — ED Notes (Signed)
Stated to staff he is leaving due to wait.

## 2022-03-04 NOTE — ED Triage Notes (Signed)
Pt is here for a abscess on the back for a while but worsen yesterday. Pt states area is red , tender to touch

## 2022-03-05 ENCOUNTER — Ambulatory Visit
Admission: RE | Admit: 2022-03-05 | Discharge: 2022-03-05 | Disposition: A | Discharge status: Home / Self Care | Payer: Medicare PPO | Source: Ambulatory Visit

## 2022-03-05 ENCOUNTER — Other Ambulatory Visit: Payer: Self-pay

## 2022-03-05 VITALS — BP 144/86 | HR 89 | Temp 98.5°F | Resp 20 | Ht 71.0 in | Wt 212.0 lb

## 2022-03-05 DIAGNOSIS — L02212 Cutaneous abscess of back [any part, except buttock]: Secondary | ICD-10-CM

## 2022-03-05 MED ORDER — SULFAMETHOXAZOLE-TRIMETHOPRIM 800-160 MG PO TABS: 1.0000 | ORAL_TABLET | Freq: Two times a day (BID) | ORAL | 0 refills | Status: DC

## 2022-03-05 NOTE — Discharge Instructions (Addendum)
Advised patient to take medication as directed with food to completion.  Encouraged patient increase daily water intake to 64 ounces per day while taking this medication.  Advised if symptoms worsen and/or unresolved please follow-up with PCP for further evaluation.  [facility] contact provided below: [facility] [address], [address], [place], [place] [address] [phone]

## 2022-03-05 NOTE — ED Provider Notes (Signed)
[facility]    CSN: [id number] Arrival date & time: 03/05/22  0950      History   Chief Complaint Chief Complaint  Patient presents with   Abscess    HPI Glenn Collins is a 76 y.o. male.   HPI Pleasant 75 year old male presents with abscess cyst to mid back.  Reports this bump has been here for a long time but is now become tender to touch over the past 2 days.  PMH significant for CAD, s/p pacemaker, and HTN.  Past Medical History:  Diagnosis Date   Arthritis    CAD (coronary artery disease) 10/27/2018   Coronary artery disease    Heart murmur Since childhood   Hypertension    PAD (peripheral artery disease) (HCC) 10/27/2018   Presence of permanent cardiac pacemaker    Biotronik PPM    Patient Active Problem List   Diagnosis Date Noted   Environmental and seasonal allergies 01/10/2022   DVT (deep venous thrombosis) (HCC) 08/11/2021   Lower extremity edema 07/08/2021   Osteoarthritis of right hip 06/15/2021   Preoperative clearance 05/04/2021   Wellness examination 03/14/2021   Valvular heart disease 12/23/2020   Dyslipidemia 12/23/2020   Greater trochanteric pain syndrome of right lower extremity 12/23/2020   Stenosis of left subclavian artery (HCC) 04/02/2020   Aortic stenosis 04/02/2020   Pacemaker 01/05/2020   Complete heart block (HCC) 09/28/2019   Syncope 09/08/2019   Hyperlipidemia 04/22/2019   Bilateral carotid bruits 12/24/2018   PAD (peripheral artery disease) (HCC) 10/27/2018   Hypertension 10/27/2018   CAD (coronary artery disease) 10/27/2018   NSTEMI (non-ST elevated myocardial infarction) 10/27/2018   Acute back pain 10/27/2018    Past Surgical History:  Procedure Laterality Date   CARDIAC SURGERY  2019   with stents x2   HERNIA REPAIR  2012   PACEMAKER IMPLANT N/A 09/29/2019   Procedure: PACEMAKER IMPLANT;  Surgeon: Marinus Maw, MD;  Location: [facility];  Service: Cardiovascular;  Laterality: N/A;   TOTAL HIP  ARTHROPLASTY Right 06/15/2021   Procedure: TOTAL HIP ARTHROPLASTY ANTERIOR APPROACH;  Surgeon: Samson Frederic, MD;  Location: [facility];  Service: Orthopedics;  Laterality: Right;       Home Medications    Prior to Admission medications   Medication Sig Start Date End Date Taking? Authorizing Provider  aspirin EC 81 MG tablet Take 81 mg by mouth daily. Swallow whole.   Yes [provider]  sulfamethoxazole-trimethoprim (BACTRIM DS) 800-160 MG tablet Take 1 tablet by mouth 2 (two) times daily for 10 days. 03/05/22 03/15/22 Yes Trevor Iha, FNP  atorvastatin (LIPITOR) 80 MG tablet TAKE ONE TABLET BY MOUTH DAILY AT [facility] [employer] 10/11/21   Runell Gess, MD  COVID-19 mRNA bivalent vaccine, Moderna, (MODERNA COVID-19 BIVAL BOOSTER) 50 MCG/0.5ML injection Inject into the muscle. 08/22/21   Judyann Munson, MD  COVID-19 mRNA vaccine [phone] (COMIRNATY) syringe Inject into the muscle. 02/08/22   Judyann Munson, MD  diphenhydrAMINE (BENADRYL) 25 MG tablet Take 25 mg by mouth at bedtime as needed for sleep or allergies.    [provider]  influenza vaccine adjuvanted (FLUAD) 0.5 ML injection Inject into the muscle. 02/08/22   Judyann Munson, MD  metoprolol succinate (TOPROL XL) 25 MG 24 hr tablet Take 1 tablet (25 mg total) by mouth daily. 12/30/21   Runell Gess, MD  Multiple Vitamin (MULTIVITAMIN WITH MINERALS) TABS tablet Take 1 tablet by mouth daily.    [provider]  nitroGLYCERIN (NITROSTAT) 0.4 MG  SL tablet Place 1 tablet (0.4 mg total) under the tongue every 5 (five) minutes x 3 doses as needed for chest pain. 10/29/18   Arty Baumgartner, NP  ondansetron (ZOFRAN) 4 MG tablet Take 1 tablet (4 mg total) by mouth every 6 (six) hours as needed for nausea. 06/16/21   Swinteck, Arlys John, MD  ramipril (ALTACE) 10 MG capsule Take 1 capsule (10 mg total) by mouth daily. 05/06/21   Runell Gess, MD  sodium chloride (OCEAN) 0.65 % SOLN nasal spray Place 1 spray into both  nostrils as needed for congestion.    [provider]    Family History Family History  Problem Relation Age of Onset   Cancer Mother    Hearing loss Mother    CVA Father     Social History Social History   Tobacco Use   Smoking status: Former    Packs/day: 1.50    Years: 40.00    Total pack years: 60.00    Types: Cigarettes    Quit date: 10/15/2000    Years since quitting: 21.4   Smokeless tobacco: Never  Vaping Use   Vaping Use: Never used  Substance Use Topics   Alcohol use: Yes    Alcohol/week: 28.0 standard drinks of alcohol    Types: 28 Glasses of wine per week    Comment: 4 glasses per day   Drug use: Never     Allergies   Patient has no known allergies.   Review of Systems Review of Systems  Skin:  Positive for rash.  All other systems reviewed and are negative.    Physical Exam Triage Vital Signs ED Triage Vitals  Enc Vitals Group     BP 03/05/22 1029 (!) 144/86     Pulse Rate 03/05/22 1029 89     Resp 03/05/22 1029 20     Temp 03/05/22 1029 98.5 F (36.9 C)     Temp Source 03/05/22 1029 Oral     SpO2 03/05/22 1029 97 %     Weight 03/05/22 1024 212 lb (96.2 kg)     Height 03/05/22 1024 5\' 11"  (1.803 m)     Head Circumference --      Peak Flow --      Pain Score 03/05/22 1023 0     Pain Loc --      Pain Edu? --      Excl. in GC? --    No data found.  Updated Vital Signs BP (!) 144/86 (BP Location: Left Arm)   Pulse 89   Temp 98.5 F (36.9 C) (Oral)   Resp 20   Ht 5\' 11"  (1.803 m)   Wt 212 lb (96.2 kg)   SpO2 97%   BMI 29.57 kg/m    Physical Exam Vitals and nursing note reviewed.  Constitutional:      General: He is not in acute distress.    Appearance: Normal appearance. He is not ill-appearing.  HENT:     Head: Normocephalic and atraumatic.     Mouth/Throat:     Mouth: Mucous membranes are moist.     Pharynx: Oropharynx is clear.  Eyes:     Extraocular Movements: Extraocular movements intact.      Conjunctiva/sclera: Conjunctivae normal.     Pupils: Pupils are equal, round, and reactive to light.  Cardiovascular:     Rate and Rhythm: Normal rate and regular rhythm.     Pulses: Normal pulses.     Heart sounds: Normal heart sounds.  Pulmonary:     Effort: Pulmonary effort is normal.     Breath sounds: Normal breath sounds. No wheezing, rhonchi or rales.  Musculoskeletal:        General: Normal range of motion.     Cervical back: Normal range of motion and neck supple.  Skin:    General: Skin is warm and dry.     Comments: Right mid back area: ~ 4.0 cm x 3.0 cm oval shaped erythematous dermal cyst/abscess noted-please see image below  Neurological:     General: No focal deficit present.     Mental Status: He is alert and oriented to person, place, and time.      UC Treatments / Results  Labs (all labs ordered are listed, but only abnormal results are displayed) Labs Reviewed - No data to display  EKG   Radiology No results found.  Procedures Procedures (including critical care time)  Medications Ordered in UC Medications - No data to display  Initial Impression / Assessment and Plan / UC Course  I have reviewed the triage vital signs and the nursing notes.  Pertinent labs & imaging results that were available during my care of the patient were reviewed by me and considered in my medical decision making (see chart for details).     MDM: 1.  Abscess of back-Rx'd Bactrim-Advised patient to take medication as directed with food to completion.  Encouraged patient increase daily water intake to 64 ounces per day while taking this medication.  Advised if symptoms worsen and/or unresolved please follow-up with PCP for further evaluation.  [place] [facility] contact provided below: [facility] [address], [place], [place] [address] [phone].  Patient discharged home, hemodynamically stable. Final Clinical Impressions(s) / UC Diagnoses    Final diagnoses:  Abscess of back     Discharge Instructions      Advised patient to take medication as directed with food to completion.  Encouraged patient increase daily water intake to 64 ounces per day while taking this medication.  Advised if symptoms worsen and/or unresolved please follow-up with PCP for further evaluation.  [facility] contact provided below: [facility] [address], [place], [place] [address] [phone]     ED Prescriptions     Medication Sig Dispense Auth. Provider   sulfamethoxazole-trimethoprim (BACTRIM DS) 800-160 MG tablet Take 1 tablet by mouth 2 (two) times daily for 10 days. 20 tablet Trevor Iha, FNP      PDMP not reviewed this encounter.   Trevor Iha, FNP 03/05/22 1121

## 2022-03-05 NOTE — ED Triage Notes (Addendum)
Pt presents to Urgent Care with c/o abscess/cyst to mid back. States "bump" has been there for a long time, but it became red and tender to touch 2 days ago.

## 2022-03-06 ENCOUNTER — Telehealth: Payer: Self-pay

## 2022-03-06 MED ORDER — DOXYCYCLINE HYCLATE 100 MG PO CAPS: 100.0000 mg | ORAL_CAPSULE | Freq: Two times a day (BID) | ORAL | 0 refills | Status: AC

## 2022-03-06 NOTE — Telephone Encounter (Signed)
Antibiotic changed from Bactrim to doxycycline because of Bactrim drug interaction.

## 2022-03-15 ENCOUNTER — Encounter (HOSPITAL_BASED_OUTPATIENT_CLINIC_OR_DEPARTMENT_OTHER): Payer: Self-pay | Admitting: Family Medicine

## 2022-03-15 ENCOUNTER — Ambulatory Visit (HOSPITAL_BASED_OUTPATIENT_CLINIC_OR_DEPARTMENT_OTHER): Payer: Medicare PPO | Admitting: Family Medicine

## 2022-03-15 VITALS — BP 134/90 | HR 91 | Temp 97.6°F | Ht 71.0 in | Wt 210.4 lb

## 2022-03-15 DIAGNOSIS — L0291 Cutaneous abscess, unspecified: Secondary | ICD-10-CM

## 2022-03-15 NOTE — Progress Notes (Signed)
    Procedures performed today:    Incision and drainage of abscess Performed by: Josian Lanese de Peru Verbal informed consent obtained, risks and benefits discussed Timeout taken to confirm patient, procedure, equipment, site/side Location details: right side, thoracic back Anesthesia: Local infiltration with lidocaine 1% with epinephrine Anesthetic total: 1 mL Description: Patient presented with abscess on right aspect of thoracic back.  Small opening was already present, anesthetic was infiltrated into subcutaneous tissues locally and open wound was extended using scalpel.  Moderate amount of purulent material was expressed.  Abscess was explored using a cotton Q-tip. Cavity with depth of about 3.5 cm.  Wound was irrigated with sterile normal saline.  Packed with iodoform gauze with 1 cm tail outside the wound to allow for drainage.  This was covered with 4 x 4 gauze and paper tape to protect the area and allow for continued drainage.  Patient was instructed on proper care and precautions.  Blood loss minimal.  Patient tolerated procedure extremely well with no immediate complications.  Independent interpretation of notes and tests performed by another provider:  None.  Brief History, Exam, Impression, and Recommendations:    BP (!) 134/90 (BP Location: Right Arm, Patient Position: Sitting, Cuff Size: Large)   Pulse 91   Temp 97.6 F (36.4 C) (Oral)   Ht 5\' 11"  (1.803 m)   Wt 210 lb 6.4 oz (95.4 kg)   SpO2 100%   BMI 29.34 kg/m   Abscess Patient was seen recently at urgent care related to acute pain and swelling of previously stable cyst over right side of back.  At that time, he was diagnosed with acute infection, suspected abscess and was started on antibiotics.  Initially was prescribed Bactrim but this was switched to doxycycline due to concern for medication interaction with his ramipril.  Patient completed course of antibiotics recently.  Reports tolerating medication without issue.   Still has notable swelling over abscess.  Reports that this did rupture and he has had some slight drainage from the area.  Does have some associated pain in the area as well.  No issues with recent fevers, chills, sweats. On exam, patient is in no acute distress, vital signs stable.  Over right side of thoracic back, there is notable swelling, mild erythema noted over this swelling.  He does have Band-Aid in place over area where abscess did rupture.  On initial palpation, purulent material is expressed.  Ruptured area is a relatively small opening and with incision and drainage as above, this opening was extended proximally.  Expressed material appeared to be rather thick and proteinaceous, thus suggesting that this could be related to proteinaceous cystic material partially with some associated infection. As outlined in procedure note, area was copiously irrigated with normal saline with moderate to large amount of purulent material expressed.  Patient did tolerate procedure well.  Iodoform gauze was placed to prevent recollection of abscess and purulent material.  Given likely underlying cystic structure, we will place referral for patient to establish with dermatologist locally as he may need more complete excision of the cyst sac. Plan for follow-up in about 5 to 7 days to assess progress with wound and assess need for repacking with iodoform gauze  Return in about 5 days (around 03/20/2022).   ___________________________________________ Llewellyn Choplin de 14/07/2021, MD, ABFM, CAQSM Primary Care and Sports Medicine [facility]

## 2022-03-15 NOTE — Assessment & Plan Note (Signed)
Patient was seen recently at urgent care related to acute pain and swelling of previously stable cyst over right side of back.  At that time, he was diagnosed with acute infection, suspected abscess and was started on antibiotics.  Initially was prescribed Bactrim but this was switched to doxycycline due to concern for medication interaction with his ramipril.  Patient completed course of antibiotics recently.  Reports tolerating medication without issue.  Still has notable swelling over abscess.  Reports that this did rupture and he has had some slight drainage from the area.  Does have some associated pain in the area as well.  No issues with recent fevers, chills, sweats. On exam, patient is in no acute distress, vital signs stable.  Over right side of thoracic back, there is notable swelling, mild erythema noted over this swelling.  He does have Band-Aid in place over area where abscess did rupture.  On initial palpation, purulent material is expressed.  Ruptured area is a relatively small opening and with incision and drainage as above, this opening was extended proximally.  Expressed material appeared to be rather thick and proteinaceous, thus suggesting that this could be related to proteinaceous cystic material partially with some associated infection. As outlined in procedure note, area was copiously irrigated with normal saline with moderate to large amount of purulent material expressed.  Patient did tolerate procedure well.  Iodoform gauze was placed to prevent recollection of abscess and purulent material.  Given likely underlying cystic structure, we will place referral for patient to establish with dermatologist locally as he may need more complete excision of the cyst sac. Plan for follow-up in about 5 to 7 days to assess progress with wound and assess need for repacking with iodoform gauze

## 2022-03-21 ENCOUNTER — Encounter (HOSPITAL_BASED_OUTPATIENT_CLINIC_OR_DEPARTMENT_OTHER): Payer: Self-pay | Admitting: Family Medicine

## 2022-03-21 ENCOUNTER — Ambulatory Visit (HOSPITAL_BASED_OUTPATIENT_CLINIC_OR_DEPARTMENT_OTHER): Payer: Medicare PPO | Admitting: Family Medicine

## 2022-03-21 VITALS — BP 129/75 | HR 77 | Ht 71.0 in | Wt 211.9 lb

## 2022-03-21 DIAGNOSIS — L0291 Cutaneous abscess, unspecified: Secondary | ICD-10-CM

## 2022-03-21 NOTE — Assessment & Plan Note (Addendum)
Patient presents for follow-up of abscess on back today.  He reports that he has generally been doing well.  He denies any fevers, chills, sweats.  He has had decreased pain over abscess site.  Does feel that overlying skin is a bit irritated due to paper tape.  He has been having some mild drainage from abscess site.  Still has iodoform gauze in place. On exam, patient is in no acute distress, vital signs stable, patient is afebrile.  Abscess with bandage in place over wound.  There is mild redness at incision site, however no significant erythema over skin surrounding abscess.  Wound packing is also still in place.  Wound further drained and cleaned/prepped as outlined above Discussed wound management with patient.  We will plan for follow-up in about 1 week to assess progress with wound and next steps in management, such as need for repacking wound or any other intervention.

## 2022-03-21 NOTE — Patient Instructions (Signed)
  Medication Instructions:  Your physician recommends that you continue on your current medications as directed. Please refer to the Current Medication list given to you today. --If you need a refill on any your medications before your next appointment, please call your pharmacy first. If no refills are authorized on file call the office.-- Lab Work: Your physician has recommended that you have lab work today: No If you have labs (blood work) drawn today and your tests are completely normal, you will receive your results via MyChart message OR a phone call from our staff.  Please ensure you check your voicemail in the event that you authorized detailed messages to be left on a delegated number. If you have any lab test that is abnormal or we need to change your treatment, we will call you to review the results.  Referrals/Procedures/Imaging: No  Follow-Up: Your next appointment:   Your physician recommends that you schedule a follow-up appointment in: a couple with Dr. de Peru  You will receive a text message or e-mail with a link to a survey about your care and experience with [place] today! We would greatly appreciate your feedback!   Thanks for letting us be apart of your health journey!!  Primary Care and Sports Medicine   Dr. Ceasar Mons Peru   We encourage you to activate your patient portal called "MyChart".  Sign up information is provided on this After Visit Summary.  MyChart is used to connect with patients for Virtual Visits (Telemedicine).  Patients are able to view lab/test results, encounter notes, upcoming appointments, etc.  Non-urgent messages can be sent to your provider as well. To learn more about what you can do with MyChart, please visit --  [email or web address].

## 2022-03-21 NOTE — Progress Notes (Signed)
    Procedures performed today:    Abscess: After removal of wound packing, pressure was applied around the abscess and small amount of purulent material was expressed.  Area was irrigated with saline solution.  Given that depth of abscess was still about 2.5 cm, wound was again packed with iodoform gauze.  Patient tolerated procedure well overall.  Wound was covered with sterile adhesive bandage.  Patient instructed on proper wound care.  Independent interpretation of notes and tests performed by another provider:   None.  Brief History, Exam, Impression, and Recommendations:    BP 129/75 (BP Location: Right Arm, Patient Position: Sitting, Cuff Size: Large)   Pulse 77   Ht 5\' 11"  (1.803 m)   Wt 211 lb 14.4 oz (96.1 kg)   SpO2 100%   BMI 29.55 kg/m   Abscess Patient presents for follow-up of abscess on back today.  He reports that he has generally been doing well.  He denies any fevers, chills, sweats.  He has had decreased pain over abscess site.  Does feel that overlying skin is a bit irritated due to paper tape.  He has been having some mild drainage from abscess site.  Still has iodoform gauze in place. On exam, patient is in no acute distress, vital signs stable, patient is afebrile.  Abscess with bandage in place over wound.  There is mild redness at incision site, however no significant erythema over skin surrounding abscess.  Wound packing is also still in place.  Wound further drained and cleaned/prepped as outlined above Discussed wound management with patient.  We will plan for follow-up in about 1 week to assess progress with wound and next steps in management, such as need for repacking wound or any other intervention.  Return in about 1 week (around 03/28/2022).   ___________________________________________ Lorraine Terriquez de 14/03/2022, MD, ABFM, CAQSM Primary Care and Sports Medicine [facility]

## 2022-03-27 ENCOUNTER — Ambulatory Visit (HOSPITAL_BASED_OUTPATIENT_CLINIC_OR_DEPARTMENT_OTHER): Payer: Medicare PPO | Admitting: Family Medicine

## 2022-03-27 ENCOUNTER — Encounter (HOSPITAL_BASED_OUTPATIENT_CLINIC_OR_DEPARTMENT_OTHER): Payer: Self-pay | Admitting: Family Medicine

## 2022-03-27 VITALS — BP 119/71 | HR 80 | Ht 71.0 in | Wt 211.5 lb

## 2022-03-27 DIAGNOSIS — L0291 Cutaneous abscess, unspecified: Secondary | ICD-10-CM | POA: Diagnosis not present

## 2022-03-27 NOTE — Progress Notes (Signed)
    Procedures performed today:    None.  Independent interpretation of notes and tests performed by another provider:   None.  Brief History, Exam, Impression, and Recommendations:    BP 119/71 (BP Location: Right Arm, Patient Position: Sitting, Cuff Size: Large)   Pulse 80   Ht 5\' 11"  (1.803 m)   Wt 211 lb 8 oz (95.9 kg)   SpO2 100%   BMI 29.50 kg/m   Abscess Patient presents today for follow-up of abscess on back.  He reports that generally he has been doing well.  Denies any significant pain over area.  Continues to have some mild discharge from abscess site.  He has not had any systemic symptoms such as fever, chills, sweats. On exam, patient is in no acute distress, vital signs stable, patient is afebrile.  Wound with bandage in place over top.  On inspection, no notable erythema present surrounding the wound.  Iodoform gauze remains in place with small tail extending outside of the wound.  On palpation, very small amount of discharge expressed. Patient does appear to be progressing well with gradual healing at wound site.  Small amount of iodoform gauze was removed from the wound today and trimmed.  We will continue with monitoring, bandage in place at this time Plan for follow-up in 1 week to assess progress  Return in about 1 week (around 04/03/2022) for Abscess.   ___________________________________________ Reianna Batdorf de 04/05/2022, MD, ABFM, CAQSM Primary Care and Sports Medicine [facility]

## 2022-03-27 NOTE — Patient Instructions (Signed)
  Medication Instructions:  Your physician recommends that you continue on your current medications as directed. Please refer to the Current Medication list given to you today. --If you need a refill on any your medications before your next appointment, please call your pharmacy first. If no refills are authorized on file call the office.-- Lab Work: Your physician has recommended that you have lab work today: No If you have labs (blood work) drawn today and your tests are completely normal, you will receive your results via MyChart message OR a phone call from our staff.  Please ensure you check your voicemail in the event that you authorized detailed messages to be left on a delegated number. If you have any lab test that is abnormal or we need to change your treatment, we will call you to review the results.  Referrals/Procedures/Imaging: No  Follow-Up: Your next appointment:   Your physician recommends that you schedule a follow-up appointment in: 1 week with Dr. de Peru.  You will receive a text message or e-mail with a link to a survey about your care and experience with [place] today! We would greatly appreciate your feedback!   Thanks for letting us be apart of your health journey!!  Primary Care and Sports Medicine   Dr. Ceasar Mons Peru   We encourage you to activate your patient portal called "MyChart".  Sign up information is provided on this After Visit Summary.  MyChart is used to connect with patients for Virtual Visits (Telemedicine).  Patients are able to view lab/test results, encounter notes, upcoming appointments, etc.  Non-urgent messages can be sent to your provider as well. To learn more about what you can do with MyChart, please visit --  [email or web address].

## 2022-03-27 NOTE — Assessment & Plan Note (Signed)
Patient presents today for follow-up of abscess on back.  He reports that generally he has been doing well.  Denies any significant pain over area.  Continues to have some mild discharge from abscess site.  He has not had any systemic symptoms such as fever, chills, sweats. On exam, patient is in no acute distress, vital signs stable, patient is afebrile.  Wound with bandage in place over top.  On inspection, no notable erythema present surrounding the wound.  Iodoform gauze remains in place with small tail extending outside of the wound.  On palpation, very small amount of discharge expressed. Patient does appear to be progressing well with gradual healing at wound site.  Small amount of iodoform gauze was removed from the wound today and trimmed.  We will continue with monitoring, bandage in place at this time Plan for follow-up in 1 week to assess progress

## 2022-03-28 ENCOUNTER — Ambulatory Visit (INDEPENDENT_AMBULATORY_CARE_PROVIDER_SITE_OTHER): Payer: Medicare PPO

## 2022-03-28 DIAGNOSIS — I442 Atrioventricular block, complete: Secondary | ICD-10-CM | POA: Diagnosis not present

## 2022-03-28 LAB — CUP PACEART REMOTE DEVICE CHECK
Date Time Interrogation Session: 20231212074729
Implantable Lead Connection Status: 753985
Implantable Lead Connection Status: 753985
Implantable Lead Implant Date: 20210614
Implantable Lead Implant Date: 20210614
Implantable Lead Location: 753859
Implantable Lead Location: 753860
Implantable Lead Model: 377171
Implantable Lead Model: 377171
Implantable Lead Serial Number: 7000244971
Implantable Lead Serial Number: 7000271886
Implantable Pulse Generator Implant Date: 20210614
Pulse Gen Model: 407145
Pulse Gen Serial Number: 69870190

## 2022-04-03 ENCOUNTER — Encounter (HOSPITAL_BASED_OUTPATIENT_CLINIC_OR_DEPARTMENT_OTHER): Payer: Self-pay | Admitting: Family Medicine

## 2022-04-03 ENCOUNTER — Ambulatory Visit (HOSPITAL_BASED_OUTPATIENT_CLINIC_OR_DEPARTMENT_OTHER): Payer: Medicare PPO | Admitting: Family Medicine

## 2022-04-03 VITALS — BP 127/76 | HR 91 | Ht 71.0 in | Wt 211.0 lb

## 2022-04-03 DIAGNOSIS — L0291 Cutaneous abscess, unspecified: Secondary | ICD-10-CM | POA: Diagnosis not present

## 2022-04-03 NOTE — Progress Notes (Signed)
    Procedures performed today:    None.  Independent interpretation of notes and tests performed by another provider:   None.  Brief History, Exam, Impression, and Recommendations:    BP 127/76 (BP Location: Right Arm, Patient Position: Sitting, Cuff Size: Large)   Pulse 91   Ht 5\' 11"  (1.803 m)   Wt 211 lb (95.7 kg)   SpO2 100%   BMI 29.43 kg/m   Abscess Patient presents for follow-up of abscess/cyst over right side of back.  He has bandage in place today.  He reports that he has been doing well since last appointment.  Denies any new issues such as increased pain, fever, chills.  He has arranged for establishing visit with dermatologist locally, that appointment is January 18. On exam, patient is in no acute distress, vital signs stable.  Abscess over right side of back with bandage in place.  Also still has iodoform gauze extending from abscess opening.  No significant surrounding erythema or warmth.  Iodoform gauze removed from wound today.  Mixture of whitish solid and white/gray fluid expressed from wound site.  Depth of cavity has decreased since prior visits. At this time, we will not repack wound with gauze today.  We will plan to follow-up in about 2 weeks.  Based on exam and wound progress, current evaluation favors cystic buildup as opposed to abscess.  Advised on changes for which patient should be mindful of and return the office sooner if these do occur, patient voices understanding  Return in about 2 weeks (around 04/17/2022).   ___________________________________________ Saad Buhl de [place], MD, ABFM, CAQSM [facility]

## 2022-04-04 NOTE — Assessment & Plan Note (Signed)
Patient presents for follow-up of abscess/cyst over right side of back.  He has bandage in place today.  He reports that he has been doing well since last appointment.  Denies any new issues such as increased pain, fever, chills.  He has arranged for establishing visit with dermatologist locally, that appointment is January 18. On exam, patient is in no acute distress, vital signs stable.  Abscess over right side of back with bandage in place.  Also still has iodoform gauze extending from abscess opening.  No significant surrounding erythema or warmth.  Iodoform gauze removed from wound today.  Mixture of whitish solid and white/gray fluid expressed from wound site.  Depth of cavity has decreased since prior visits. At this time, we will not repack wound with gauze today.  We will plan to follow-up in about 2 weeks.  Based on exam and wound progress, current evaluation favors cystic buildup as opposed to abscess.  Advised on changes for which patient should be mindful of and return the office sooner if these do occur, patient voices understanding

## 2022-04-18 ENCOUNTER — Ambulatory Visit (HOSPITAL_BASED_OUTPATIENT_CLINIC_OR_DEPARTMENT_OTHER): Payer: Medicare Other | Admitting: Family Medicine

## 2022-04-18 ENCOUNTER — Encounter (HOSPITAL_BASED_OUTPATIENT_CLINIC_OR_DEPARTMENT_OTHER): Payer: Self-pay | Admitting: Family Medicine

## 2022-04-18 VITALS — BP 138/67 | HR 79 | Ht 71.0 in | Wt 212.0 lb

## 2022-04-18 DIAGNOSIS — L0291 Cutaneous abscess, unspecified: Secondary | ICD-10-CM

## 2022-04-18 NOTE — Progress Notes (Signed)
    Procedures performed today:    None.  Independent interpretation of notes and tests performed by another provider:   None.  Brief History, Exam, Impression, and Recommendations:    BP 138/67 (BP Location: Left Arm, Patient Position: Sitting, Cuff Size: Large)   Pulse 79   Ht 5\' 11"  (1.803 m)   Wt 212 lb (96.2 kg)   SpO2 100%   BMI 29.57 kg/m   Abscess Patient reports that he has been doing well.  He has continued to He denies any concerns related to the area where cyst/abscess was located.  Denies any pain, still has had small amount of discharge at times.  He continues with having bandage in place over abscess.  He denies any fever, chills, sweats.  His appointment with dermatology is in about 2 weeks. On exam, patient is in no acute distress, vital signs stable, patient is afebrile.  He has occlusive dressing over area at this time.  There is no significant surrounding erythema or redness.  No fluctuance noted.  Wound is still open currently, small amount of serous sanguinous fluid expressed. No evidence of acute infection currently.  Recommend continuing with scheduled appointment with dermatology for further evaluation.  Can continue with applying dressing over area with gauze or Band-Aid to absorb any drainage that may occur.  If patient has any further issues with the wound site such as increasing pain, discharge or other questions or concerns, recommend that he return to the office as needed  Return if symptoms worsen or fail to improve.   ___________________________________________ Glenn Urbas [place], MD, ABFM, CAQSM Primary Care and [place]

## 2022-04-18 NOTE — Assessment & Plan Note (Signed)
Patient reports that he has been doing well.  He has continued to He denies any concerns related to the area where cyst/abscess was located.  Denies any pain, still has had small amount of discharge at times.  He continues with having bandage in place over abscess.  He denies any fever, chills, sweats.  His appointment with dermatology is in about 2 weeks. On exam, patient is in no acute distress, vital signs stable, patient is afebrile.  He has occlusive dressing over area at this time.  There is no significant surrounding erythema or redness.  No fluctuance noted.  Wound is still open currently, small amount of serous sanguinous fluid expressed. No evidence of acute infection currently.  Recommend continuing with scheduled appointment with dermatology for further evaluation.  Can continue with applying dressing over area with gauze or Band-Aid to absorb any drainage that may occur.  If patient has any further issues with the wound site such as increasing pain, discharge or other questions or concerns, recommend that he return to the office as needed

## 2022-04-26 NOTE — Progress Notes (Signed)
Remote pacemaker transmission.   

## 2022-04-27 IMAGING — US US EXTREM LOW VENOUS*R*
1 series · 14 of 24 positions shown · non-contrast
Comparison: Intraoperative fluoroscopy 06/16/2018.

CLINICAL DATA: RIGHT calf/posterior knee pain. Recent RIGHT hip
replacement in [DATE].

EXAM:
RIGHT LOWER EXTREMITY VENOUS DOPPLER ULTRASOUND
TECHNIQUE: Gray-scale sonography with compression, as well as color and duplex
ultrasound, were performed to evaluate the deep venous system(s)
from the level of the common femoral vein through the popliteal and
proximal calf veins.

[Series 1: us venous img lower uni right (dvt) · portal-venous · 52 acquisitions, 14 frames shown]
[im 1/52]
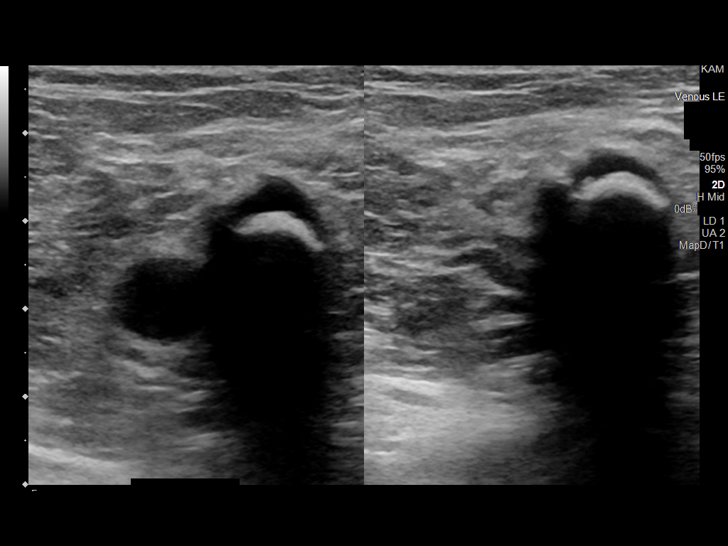
[im 5/52]
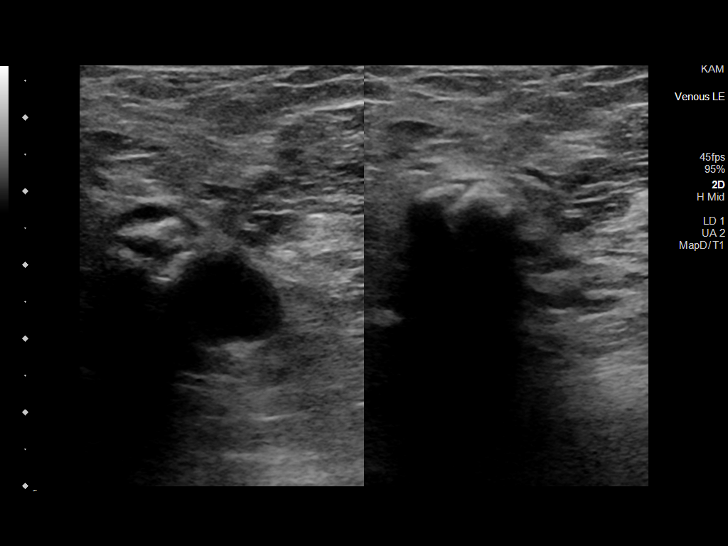
[im 9/52]
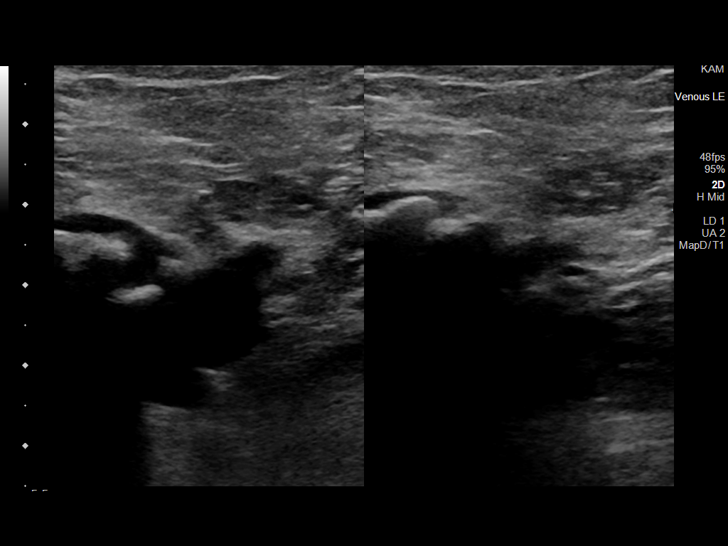
[im 14/52]
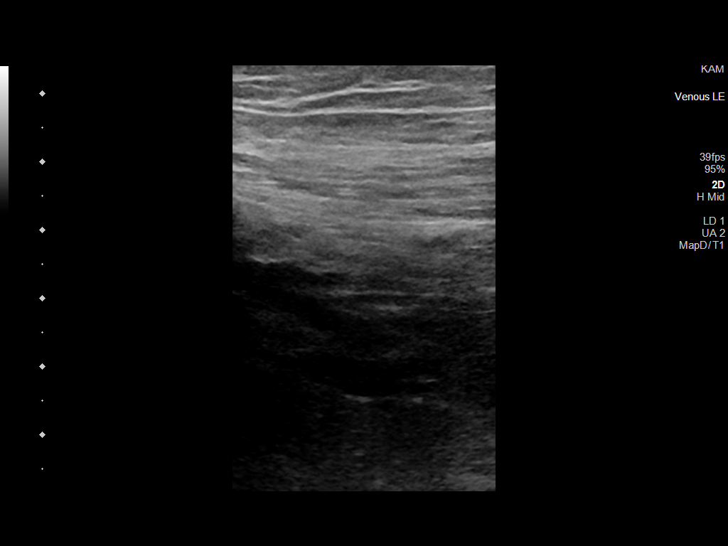
[im 16/52]
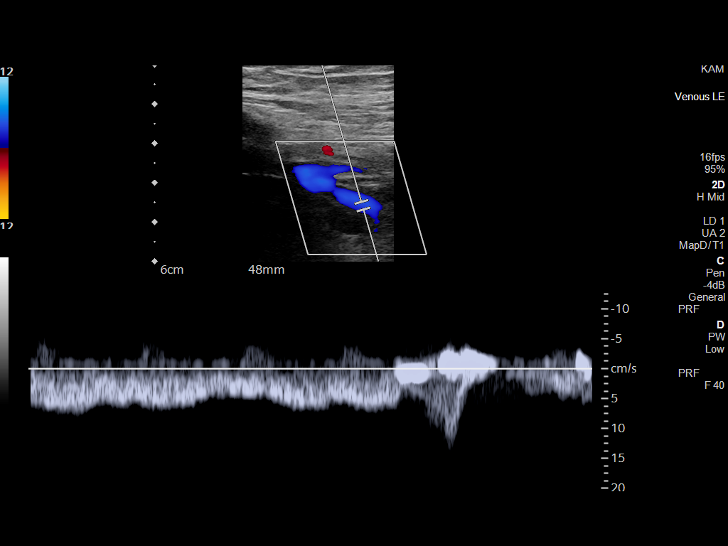
[im 20/52]
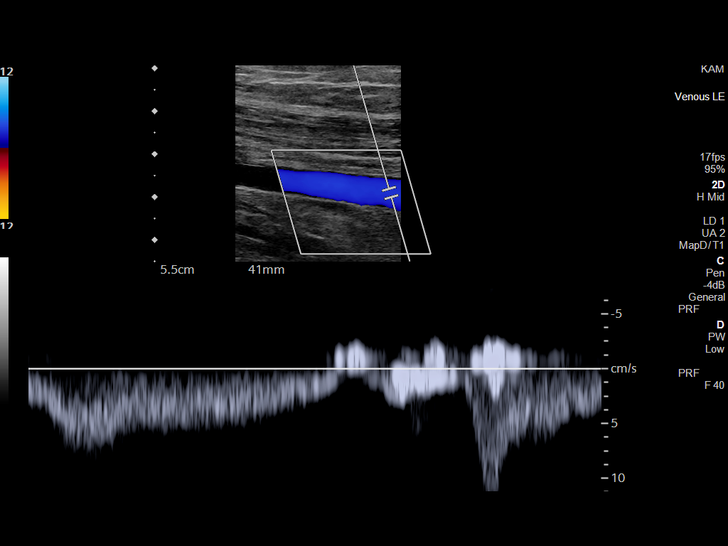
[im 25/52]
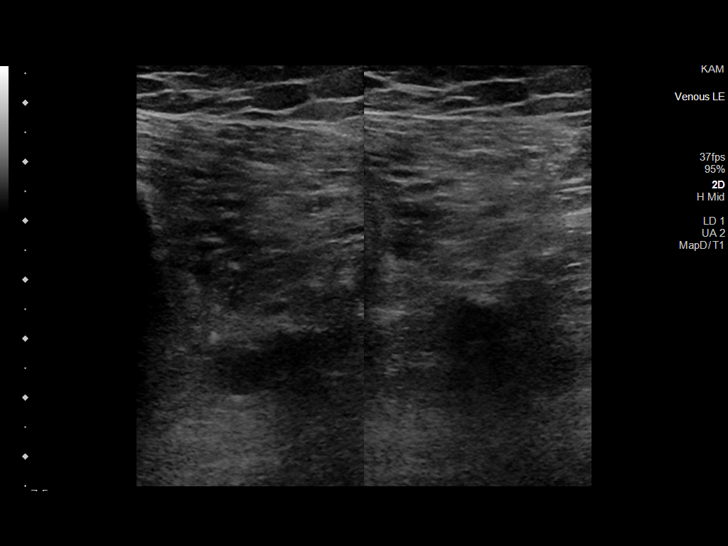
[im 29/52]
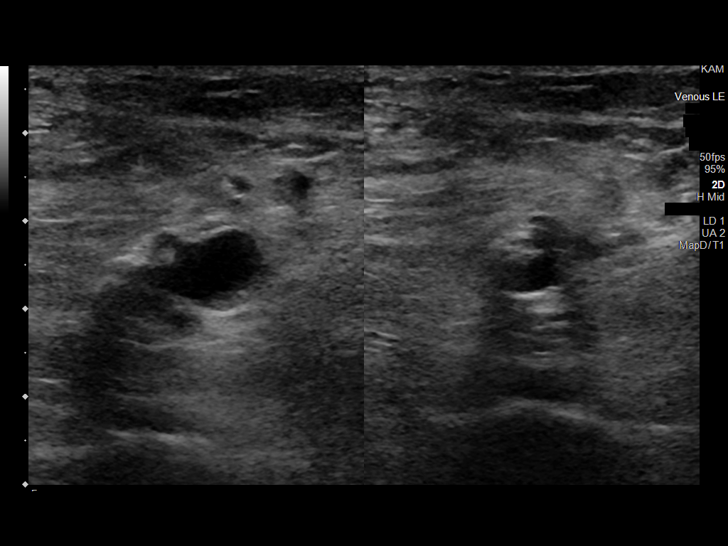
[im 34/52]
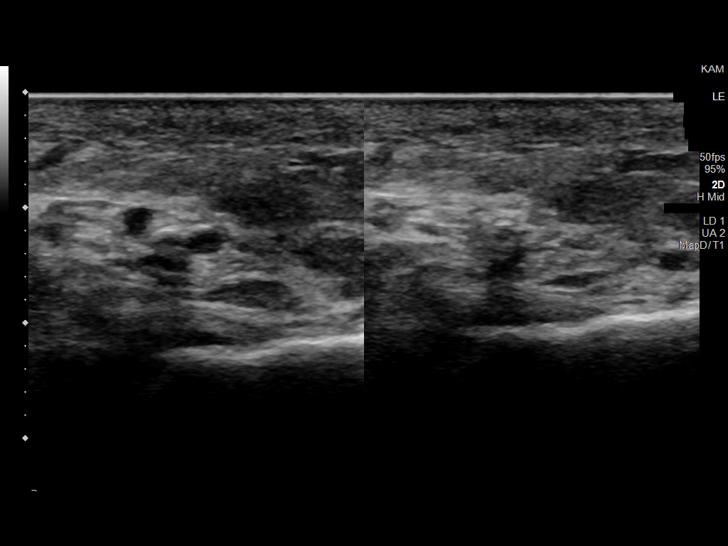
[im 38/52]
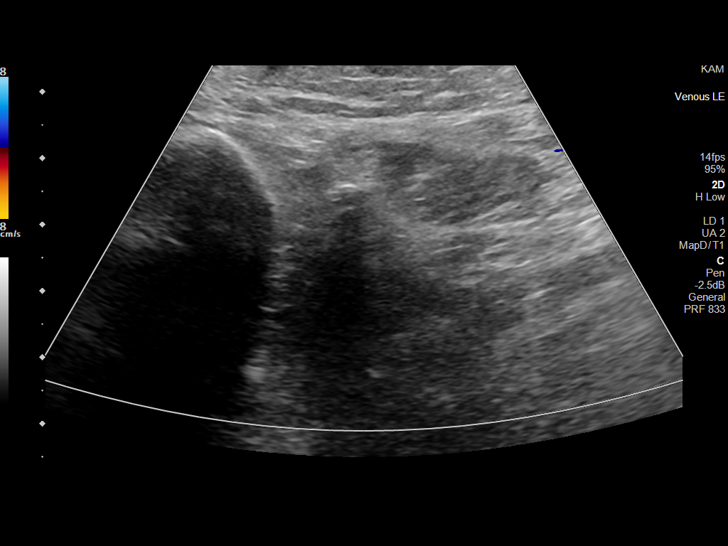
[im 43/52]
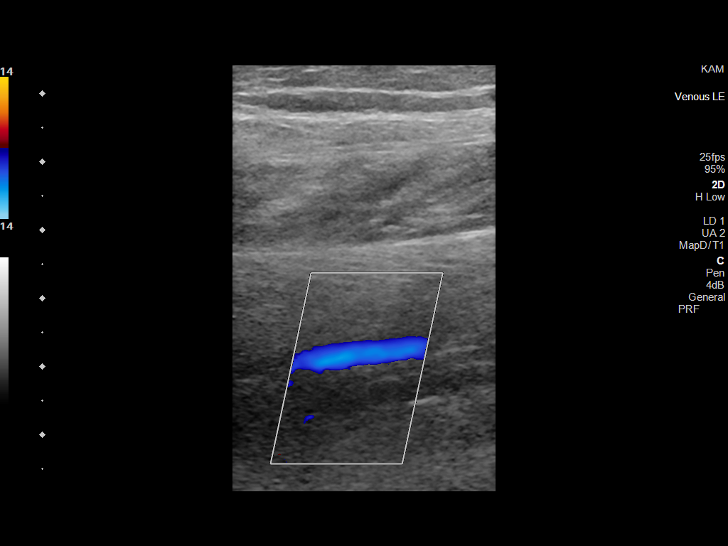
[im 45/52]
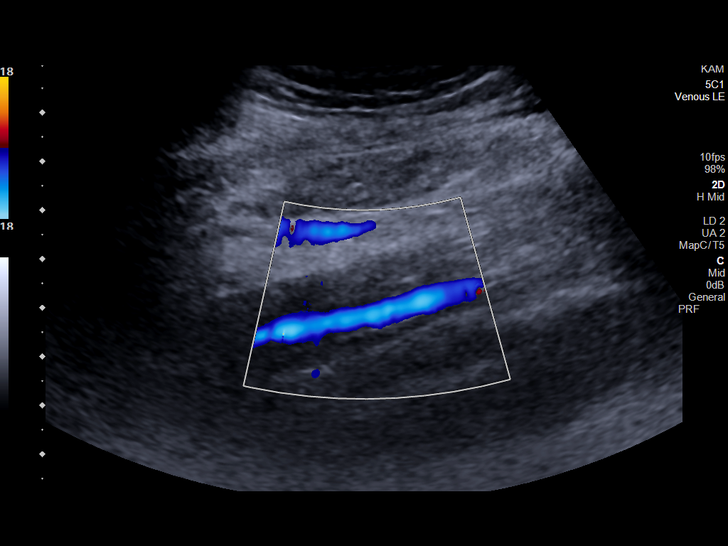
[im 49/52]
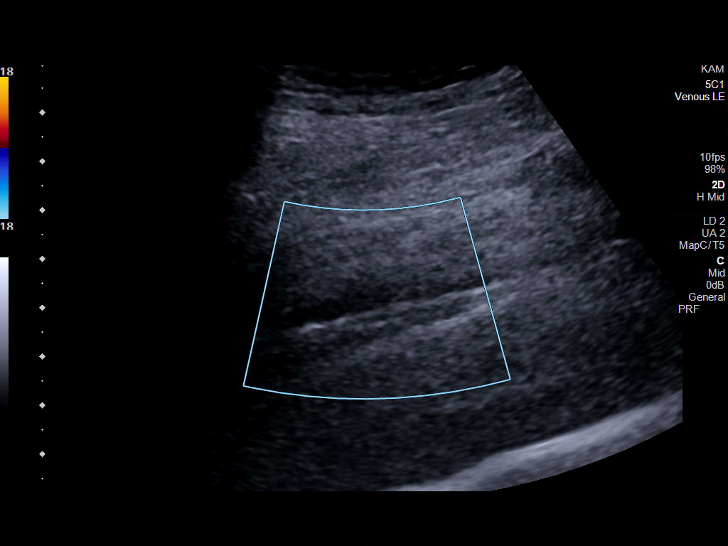
[im 52/52]
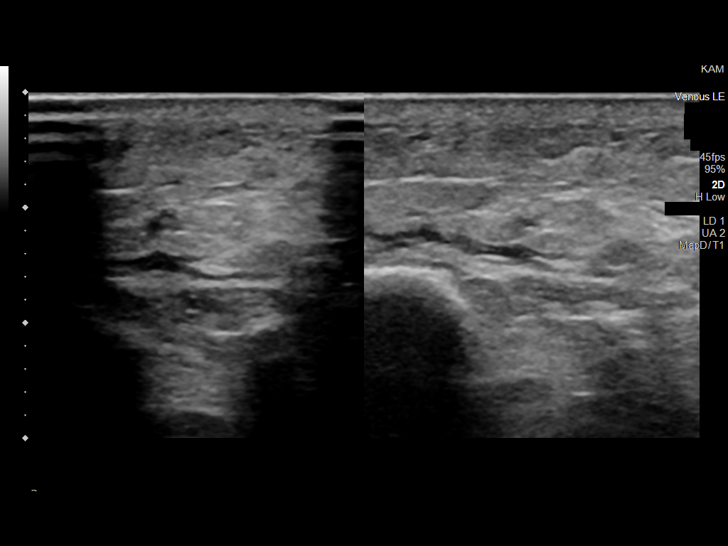

[14 of 24 positions shown; findings below may reference images not displayed]

FINDINGS: VENOUS

Normal compressibility of the RIGHT common femoral, superficial
femoral and popliteal veins.

The posterior tibial veins are visualized at the level of the ankle
and appear patent. One of the paired peroneal veins is
incompressible and without Doppler flow. See key image.

Visualized portions of profunda femoral vein and great saphenous
vein unremarkable.

Limited views of the contralateral common femoral vein are
unremarkable.

OTHER

No evidence of superficial thrombophlebitis or abnormal fluid
collection.

Limitations: none
IMPRESSION: POSITIVE RIGHT lower extremity DVT, within one of the paired
peroneal veins.

## 2022-05-08 ENCOUNTER — Ambulatory Visit (HOSPITAL_BASED_OUTPATIENT_CLINIC_OR_DEPARTMENT_OTHER): Payer: Medicare Other

## 2022-05-09 ENCOUNTER — Ambulatory Visit (HOSPITAL_BASED_OUTPATIENT_CLINIC_OR_DEPARTMENT_OTHER): Payer: Medicare Other

## 2022-05-10 LAB — CBC WITH DIFFERENTIAL/PLATELET
Basophils Absolute: 0.1 10*3/uL (ref 0.0–0.2)
Basos: 1 %
EOS (ABSOLUTE): 0.1 10*3/uL (ref 0.0–0.4)
Eos: 2 %
Hematocrit: 39.2 % (ref 37.5–51.0)
Hemoglobin: 13 g/dL (ref 13.0–17.7)
Immature Grans (Abs): 0 10*3/uL (ref 0.0–0.1)
Immature Granulocytes: 0 %
Lymphocytes Absolute: 1.3 10*3/uL (ref 0.7–3.1)
Lymphs: 17 %
MCH: 32.3 pg (ref 26.6–33.0)
MCHC: 33.2 g/dL (ref 31.5–35.7)
MCV: 97 fL (ref 79–97)
Monocytes Absolute: 1.1 10*3/uL — ABNORMAL HIGH (ref 0.1–0.9)
Monocytes: 14 %
Neutrophils Absolute: 5.1 10*3/uL (ref 1.4–7.0)
Neutrophils: 66 %
Platelets: 229 10*3/uL (ref 150–450)
RBC: 4.03 x10E6/uL — ABNORMAL LOW (ref 4.14–5.80)
RDW: 13.4 % (ref 11.6–15.4)
WBC: 7.6 10*3/uL (ref 3.4–10.8)

## 2022-05-10 LAB — COMPREHENSIVE METABOLIC PANEL
ALT: 16 IU/L (ref 0–44)
AST: 24 IU/L (ref 0–40)
Albumin/Globulin Ratio: 2 (ref 1.2–2.2)
Albumin: 4.7 g/dL (ref 3.8–4.8)
Alkaline Phosphatase: 98 IU/L (ref 44–121)
BUN/Creatinine Ratio: 12 (ref 10–24)
BUN: 12 mg/dL (ref 8–27)
Bilirubin Total: 0.4 mg/dL (ref 0.0–1.2)
CO2: 22 mmol/L (ref 20–29)
Calcium: 9.5 mg/dL (ref 8.6–10.2)
Chloride: 99 mmol/L (ref 96–106)
Creatinine, Ser: 1 mg/dL (ref 0.76–1.27)
Globulin, Total: 2.4 g/dL (ref 1.5–4.5)
Glucose: 98 mg/dL (ref 70–99)
Potassium: 5.2 mmol/L (ref 3.5–5.2)
Sodium: 138 mmol/L (ref 134–144)
Total Protein: 7.1 g/dL (ref 6.0–8.5)
eGFR: 78 mL/min/{1.73_m2} (ref >59)

## 2022-05-10 LAB — LIPID PANEL
Chol/HDL Ratio: 2.3 ratio (ref 0.0–5.0)
Cholesterol, Total: 174 mg/dL (ref 100–199)
HDL: 76 mg/dL (ref >39)
LDL Chol Calc (NIH): 84 mg/dL (ref 0–99)
Triglycerides: 72 mg/dL (ref 0–149)
VLDL Cholesterol Cal: 14 mg/dL (ref 5–40)

## 2022-05-10 LAB — HEMOGLOBIN A1C
Est. average glucose Bld gHb Est-mCnc: 111 mg/dL
Hgb A1c MFr Bld: 5.5 % (ref 4.8–5.6)

## 2022-05-12 ENCOUNTER — Ambulatory Visit (HOSPITAL_BASED_OUTPATIENT_CLINIC_OR_DEPARTMENT_OTHER): Payer: Medicare PPO | Admitting: Family Medicine

## 2022-05-15 ENCOUNTER — Ambulatory Visit (HOSPITAL_BASED_OUTPATIENT_CLINIC_OR_DEPARTMENT_OTHER): Payer: Medicare Other | Admitting: Family Medicine

## 2022-05-15 ENCOUNTER — Encounter (HOSPITAL_BASED_OUTPATIENT_CLINIC_OR_DEPARTMENT_OTHER): Payer: Self-pay | Admitting: Family Medicine

## 2022-05-15 VITALS — BP 135/65 | HR 92 | Ht 71.0 in | Wt 210.7 lb

## 2022-05-15 DIAGNOSIS — I10 Essential (primary) hypertension: Secondary | ICD-10-CM

## 2022-05-15 DIAGNOSIS — E782 Mixed hyperlipidemia: Secondary | ICD-10-CM | POA: Diagnosis not present

## 2022-05-15 DIAGNOSIS — L0291 Cutaneous abscess, unspecified: Secondary | ICD-10-CM | POA: Diagnosis not present

## 2022-05-15 NOTE — Assessment & Plan Note (Signed)
Followed by derm, area has healed, has derm follow-up on 06/15/22.

## 2022-05-15 NOTE — Assessment & Plan Note (Addendum)
Taking ramipril 10 mg daily as prescribed per cards, taking metoprolol XL 25 mg daily as prescribed per cards. Denies chest pain, shortness of breath, lower extremity edema, vision changes, or headaches.  Labs done on 05/09/22, reviewed with patient at visit today. Blood pressure well controlled. Continue above medications per cardiology. Follow-up with PCP in 3 months, cardiology in 6 months. No refills needed today.

## 2022-05-15 NOTE — Progress Notes (Signed)
Established Patient Office Visit  Subjective   Patient ID: Glenn Collins, male    DOB: [birth year]  Age: 77 y.o. MRN: [id number]  Chief Complaint  Patient presents with   Follow-up    Pt here for f/u on HTN and labs     HPI  Hypertension Medication compliance: taking medications as prescribed per cardiology.  Denies chest pain, shortness of breath, lower extremity edema, vision changes, headaches.  Pertinent lab work: done 05/09/22 and reviewed with patient Monitoring: does not check at home, well-controlled in office Tolerating medication well: no side effects.  Continue current medication regimen: continue Follow-up: Q 3 months, sees cardiology every 6 months Refills per cards.   Hyperlipidemia:  Medication compliance: taking daily as prescribed Denies chest pain, shortness of breath, lower extremity edema, myalgias, muscle weakness, changes in appearance of urine.  Pertinent lab work: done on 05/09/22, reviewed today Monitoring: Q 6 months Continue current medication regimen: atorvastatin 80 mg Q day Follow-up: Q 3 months Refills per cards.  01/10/22 office visit per PCP reviewed.  Labs 05/09/22 reviewed.   Abscess on back has healed-- being followed by  derm. No further evaluation per PCP today. Has appointment with derm on 06/15/22 for follow-up.   Review of Systems  Constitutional:  Negative for chills and fever.  Eyes:  Negative for blurred vision and double vision.  Respiratory:  Negative for shortness of breath.   Cardiovascular:  Negative for chest pain and leg swelling.  Gastrointestinal:  Negative for abdominal pain, nausea and vomiting.  Neurological:  Negative for dizziness and headaches.      Objective:     BP 135/65 (BP Location: Left Arm, Patient Position: Sitting, Cuff Size: Normal)   Pulse 92   Ht 5\' 11"  (1.803 m)   Wt 210 lb 11.2 oz (95.6 kg)   SpO2 99%   BMI 29.39 kg/m    Physical Exam Vitals and nursing note reviewed.  Constitutional:       General: He is not in acute distress.    Appearance: Normal appearance.  Cardiovascular:     Rate and Rhythm: Normal rate and regular rhythm.     Heart sounds: Normal heart sounds.  Pulmonary:     Effort: Pulmonary effort is normal.     Breath sounds: Normal breath sounds.  Skin:    General: Skin is warm and dry.     Capillary Refill: Capillary refill takes less than 2 seconds.  Neurological:     General: No focal deficit present.     Mental Status: He is alert. Mental status is at baseline.  Psychiatric:        Mood and Affect: Mood normal.        Behavior: Behavior normal.        Thought Content: Thought content normal.        Judgment: Judgment normal.      No results found for any visits on 05/15/22.  Last metabolic panel Lab Results  Component Value Date   GLUCOSE 98 05/09/2022   NA 138 05/09/2022   K 5.2 05/09/2022   CL 99 05/09/2022   CO2 22 05/09/2022   BUN 12 05/09/2022   CREATININE 1.00 05/09/2022   EGFR 78 05/09/2022   CALCIUM 9.5 05/09/2022   PROT 7.1 05/09/2022   ALBUMIN 4.7 05/09/2022   LABGLOB 2.4 05/09/2022   AGRATIO 2.0 05/09/2022   BILITOT 0.4 05/09/2022   ALKPHOS 98 05/09/2022   AST 24 05/09/2022   ALT 16 05/09/2022  ANIONGAP 6 06/16/2021      The ASCVD Risk score (Arnett DK, et al., 2019) failed to calculate for the following reasons:   The patient has a prior MI or stroke diagnosis    Assessment & Plan:   Problem List Items Addressed This Visit     Hypertension - Primary (Chronic)    Taking ramipril 10 mg daily as prescribed per cards, taking metoprolol XL 25 mg daily as prescribed per cards. Denies chest pain, shortness of breath, lower extremity edema, vision changes, or headaches.  Labs done on 05/09/22, reviewed with patient at visit today. Blood pressure well controlled. Continue above medications per cardiology. Follow-up with PCP in 3 months, cardiology in 6 months. No refills needed today.       Hyperlipidemia    Taking  atorvastatin 80 mg as prescribed per cards. Denies chest pain, shortness of breath, lower extremity edema, myalgias, muscle weakness, changes in appearance of urine. Lipid panel reviewed with patient at today's visit. Well-controlled. Continue atorvastatin 80 mg. Follow-up with PCP in 3 months, cardiology in  months, No refills needed today.       Abscess    Followed by derm, area has healed, has derm follow-up on 06/15/22.       Return in about 3 months (around 08/14/2022) for HTN, HLD, .    Chalmers Guest, FNP

## 2022-05-15 NOTE — Assessment & Plan Note (Signed)
Taking atorvastatin 80 mg as prescribed per cards. Denies chest pain, shortness of breath, lower extremity edema, myalgias, muscle weakness, changes in appearance of urine. Lipid panel reviewed with patient at today's visit. Well-controlled. Continue atorvastatin 80 mg. Follow-up with PCP in 3 months, cardiology in  months, No refills needed today.

## 2022-06-26 ENCOUNTER — Other Ambulatory Visit: Payer: Self-pay | Admitting: *Deleted

## 2022-06-26 DIAGNOSIS — I739 Peripheral vascular disease, unspecified: Secondary | ICD-10-CM

## 2022-06-27 ENCOUNTER — Ambulatory Visit (INDEPENDENT_AMBULATORY_CARE_PROVIDER_SITE_OTHER): Payer: Medicare Other

## 2022-06-27 ENCOUNTER — Ambulatory Visit (HOSPITAL_COMMUNITY)
Admission: RE | Admit: 2022-06-27 | Discharge: 2022-06-27 | Disposition: A | Discharge status: Home / Self Care | Payer: Medicare Other | Source: Ambulatory Visit | Attending: Vascular Surgery | Admitting: Vascular Surgery

## 2022-06-27 ENCOUNTER — Other Ambulatory Visit: Payer: Self-pay

## 2022-06-27 DIAGNOSIS — I739 Peripheral vascular disease, unspecified: Secondary | ICD-10-CM | POA: Diagnosis present

## 2022-06-27 DIAGNOSIS — I442 Atrioventricular block, complete: Secondary | ICD-10-CM

## 2022-06-27 MED ORDER — METOPROLOL SUCCINATE ER 25 MG PO TB24: 25.0000 mg | ORAL_TABLET | Freq: Every day | ORAL | 0 refills | Status: DC

## 2022-06-28 ENCOUNTER — Ambulatory Visit: Payer: Medicare Other | Admitting: Physician Assistant

## 2022-06-28 VITALS — BP 128/78 | HR 106 | Temp 98.7°F | Ht 71.0 in | Wt 212.0 lb

## 2022-06-28 DIAGNOSIS — I739 Peripheral vascular disease, unspecified: Secondary | ICD-10-CM

## 2022-06-28 NOTE — Progress Notes (Signed)
Office Note   History of Present Illness   Glenn Collins is a 77 y.o. (1945/12/20) male who presents for surveillance of PAD.  He was first seen by Dr. Donzetta Matters in 2022 for right lower extremity pain, which began in his hip and radiated down.  It was felt that this pain was due to osteoarthritis in his hip.  He also had occasional claudication in both of his calves, right greater than left.  He has a history of carotid artery stenosis which is being followed by Dr. Gwenlyn Found.  He returns today for follow-up.  He has been doing well since his last office visit.  His right hip and lower leg pain completely went away after his hip replacement in 2023.  He still has occasional claudication in his calves, right greater than left.  He denies any rest pain or tissue loss of the lower extremities.  He is still taking his aspirin and atorvastatin.  Current Outpatient Medications  Medication Sig Dispense Refill   aspirin EC 81 MG tablet Take 81 mg by mouth daily. Swallow whole.     atorvastatin (LIPITOR) 80 MG tablet TAKE ONE TABLET BY MOUTH DAILY AT 6PM 90 tablet 2   COVID-19 mRNA bivalent vaccine, Moderna, (MODERNA COVID-19 BIVAL BOOSTER) 50 MCG/0.5ML injection Inject into the muscle. 0.5 mL 0   COVID-19 mRNA vaccine 2023-2024 (COMIRNATY) syringe Inject into the muscle. 0.3 mL 0   diphenhydrAMINE (BENADRYL) 25 MG tablet Take 25 mg by mouth at bedtime as needed for sleep or allergies.     influenza vaccine adjuvanted (FLUAD) 0.5 ML injection Inject into the muscle. 0.5 mL 0   metoprolol succinate (TOPROL XL) 25 MG 24 hr tablet Take 1 tablet (25 mg total) by mouth daily. 30 tablet 0   Multiple Vitamin (MULTIVITAMIN WITH MINERALS) TABS tablet Take 1 tablet by mouth daily.     nitroGLYCERIN (NITROSTAT) 0.4 MG SL tablet Place 1 tablet (0.4 mg total) under the tongue every 5 (five) minutes x 3 doses as needed for chest pain. 25 tablet 1   ondansetron (ZOFRAN) 4 MG tablet Take 1 tablet (4 mg total) by mouth every  6 (six) hours as needed for nausea. 20 tablet 0   ramipril (ALTACE) 10 MG capsule Take 1 capsule (10 mg total) by mouth daily. 90 capsule 3   sodium chloride (OCEAN) 0.65 % SOLN nasal spray Place 1 spray into both nostrils as needed for congestion.     No current facility-administered medications for this visit.    REVIEW OF SYSTEMS (negative unless checked):   Cardiac:  '[]'$  Chest pain or chest pressure? '[]'$  Shortness of breath upon activity? '[]'$  Shortness of breath when lying flat? '[]'$  Irregular heart rhythm?  Vascular:  '[]'$  Pain in calf, thigh, or hip brought on by walking? '[]'$  Pain in feet at night that wakes you up from your sleep? '[]'$  Blood clot in your veins? '[]'$  Leg swelling?  Pulmonary:  '[]'$  Oxygen at home? '[]'$  Productive cough? '[]'$  Wheezing?  Neurologic:  '[]'$  Sudden weakness in arms or legs? '[]'$  Sudden numbness in arms or legs? '[]'$  Sudden onset of difficult speaking or slurred speech? '[]'$  Temporary loss of vision in one eye? '[]'$  Problems with dizziness?  Gastrointestinal:  '[]'$  Blood in stool? '[]'$  Vomited blood?  Genitourinary:  '[]'$  Burning when urinating? '[]'$  Blood in urine?  Psychiatric:  '[]'$  Major depression  Hematologic:  '[]'$  Bleeding problems? '[]'$  Problems with blood clotting?  Dermatologic:  '[]'$  Rashes or ulcers?  Constitutional:  '[]'$   Fever or chills?  Ear/Nose/Throat:  '[]'$  Change in hearing? '[]'$  Nose bleeds? '[]'$  Sore throat?  Musculoskeletal:  '[]'$  Back pain? '[]'$  Joint pain? '[]'$  Muscle pain?   Physical Examination   Vitals:   06/28/22 0933  BP: 128/78  Pulse: (!) 106  Temp: 98.7 F (37.1 C)  TempSrc: Temporal  SpO2: 99%  Weight: 212 lb (96.2 kg)  Height: '5\' 11"'$  (1.803 m)   Body mass index is 29.57 kg/m.  General:  WDWN in NAD; vital signs documented above Gait: Not observed HENT: WNL, normocephalic Pulmonary: normal non-labored breathing  Cardiac: regular Abdomen: soft, NT, no masses Skin: without rashes Vascular Exam/Pulses: nonpalpable pedal  pulses, monophasic pedal signals bilaterally Extremities: without ischemic changes, without Gangrene , without cellulitis; without open wounds;  Musculoskeletal: no muscle wasting or atrophy  Neurologic: A&O X 3;  No focal weakness or paresthesias are detected Psychiatric:  The pt has Normal affect.  Non-Invasive Vascular imaging   ABI (06/28/2022) R:  ABI: [place] ([place]),  PT: mono DP: mono TBI:  0.54 L:  ABI: [place] ([place]),  PT: mono DP: mono TBI: 0.68  Medical Decision Making   Glenn Collins is a 77 y.o. male who presents for surveillance of PAD  Based on the patient's vascular studies, his ABIs are still noncompressible and monophasic bilateraly. His TBIs are mostly unchanged His right hip and leg pain has resolved since we last saw him, since he had a hip replacement He still has claudication in both calves occasionally, but he is able to do what he needs to do daily On exam he has monophasic pedal signals bilaterally. There are no ulcerations or signs of tissue ischemia He can follow up with our office in 1 yr with repeat ABIs   Vicente Serene PA-C [facility] Office: [facility] MD: Donzetta Matters

## 2022-06-29 LAB — CUP PACEART REMOTE DEVICE CHECK
Battery Voltage: 80
Date Time Interrogation Session: 20240311100811
Implantable Lead Connection Status: 753985
Implantable Lead Connection Status: 753985
Implantable Lead Implant Date: 20210614
Implantable Lead Implant Date: 20210614
Implantable Lead Location: 753859
Implantable Lead Location: 753860
Implantable Lead Model: 377171
Implantable Lead Model: 377171
Implantable Lead Serial Number: 7000244971
Implantable Lead Serial Number: 7000271886
Implantable Pulse Generator Implant Date: 20210614
Pulse Gen Model: 407145
Pulse Gen Serial Number: 69870190

## 2022-06-29 LAB — VAS US ABI WITH/WO TBI

## 2022-07-10 ENCOUNTER — Other Ambulatory Visit: Payer: Self-pay | Admitting: *Deleted

## 2022-07-10 MED ORDER — ATORVASTATIN CALCIUM 80 MG PO TABS: ORAL_TABLET | ORAL | 0 refills | Status: DC

## 2022-07-24 ENCOUNTER — Other Ambulatory Visit: Payer: Self-pay | Admitting: Cardiovascular Disease

## 2022-07-27 ENCOUNTER — Ambulatory Visit (HOSPITAL_COMMUNITY): Admission: RE | Admit: 2022-07-27 | Payer: Medicare Other | Source: Ambulatory Visit

## 2022-07-27 ENCOUNTER — Encounter (HOSPITAL_COMMUNITY): Payer: Self-pay

## 2022-07-27 ENCOUNTER — Encounter (HOSPITAL_COMMUNITY): Payer: Medicare PPO

## 2022-07-27 ENCOUNTER — Telehealth: Payer: Self-pay | Admitting: Cardiovascular Disease

## 2022-07-27 NOTE — Telephone Encounter (Signed)
Patient called and said that his vascular doctor already done a test on LE Arterial. Patient says that he needs a Carotid test done

## 2022-07-27 NOTE — Telephone Encounter (Signed)
Called patient, advised that he did not have the scan completed that was recommended yet by [facility] because he seen vascular and they complete his scans. Last OV with vascular was 03/13- notes in epic. He just wanted Dr.Berry to be made aware of this. He is also questioning about a Carotid scan- I advised per last one, in 12/2021- repeat recommended in 1 year. Patient verbalized understanding.   Also, overdue for 12 month follow up, he would like to get scheduled. Scheduled for 05/10 with Dr.Berry for 1 year follow up.   Patient verbalized understanding.   Will route to MD as review.

## 2022-07-28 ENCOUNTER — Other Ambulatory Visit: Payer: Self-pay

## 2022-07-28 MED ORDER — RAMIPRIL 10 MG PO CAPS: 10.0000 mg | ORAL_CAPSULE | Freq: Every day | ORAL | 0 refills | Status: DC

## 2022-08-08 NOTE — Progress Notes (Signed)
Remote pacemaker transmission.   

## 2022-08-14 ENCOUNTER — Ambulatory Visit (HOSPITAL_BASED_OUTPATIENT_CLINIC_OR_DEPARTMENT_OTHER): Payer: Medicare Other | Admitting: Family Medicine

## 2022-08-18 ENCOUNTER — Ambulatory Visit: Payer: Medicare Other | Admitting: Student

## 2022-08-24 ENCOUNTER — Other Ambulatory Visit: Payer: Self-pay | Admitting: Cardiovascular Disease

## 2022-08-25 ENCOUNTER — Ambulatory Visit: Payer: Medicare Other | Attending: Cardiovascular Disease | Admitting: Cardiovascular Disease

## 2022-08-25 ENCOUNTER — Encounter: Payer: Self-pay | Admitting: Cardiovascular Disease

## 2022-08-25 VITALS — BP 124/72 | HR 82 | Ht 71.0 in | Wt 211.4 lb

## 2022-08-25 DIAGNOSIS — I350 Nonrheumatic aortic (valve) stenosis: Secondary | ICD-10-CM | POA: Diagnosis not present

## 2022-08-25 DIAGNOSIS — I771 Stricture of artery: Secondary | ICD-10-CM

## 2022-08-25 DIAGNOSIS — E782 Mixed hyperlipidemia: Secondary | ICD-10-CM

## 2022-08-25 DIAGNOSIS — I6523 Occlusion and stenosis of bilateral carotid arteries: Secondary | ICD-10-CM | POA: Diagnosis not present

## 2022-08-25 DIAGNOSIS — I10 Essential (primary) hypertension: Secondary | ICD-10-CM | POA: Diagnosis not present

## 2022-08-25 DIAGNOSIS — I442 Atrioventricular block, complete: Secondary | ICD-10-CM

## 2022-08-25 DIAGNOSIS — R0989 Other specified symptoms and signs involving the circulatory and respiratory systems: Secondary | ICD-10-CM

## 2022-08-25 MED ORDER — EZETIMIBE 10 MG PO TABS
10.0000 mg | ORAL_TABLET | Freq: Every day | ORAL | 3 refills | Status: DC
Start: 2022-08-25 — End: 2023-08-21

## 2022-08-25 NOTE — Assessment & Plan Note (Signed)
History of CAD status post RCA and LAD stenting in 2007 with 3 interventions in 2017 the RCA for in-stent restenosis.  He denies chest pain.

## 2022-08-25 NOTE — Assessment & Plan Note (Signed)
History of PAD with lower extremity Dopplers which I performed 01/07/2019 revealing occluded SFAs bilaterally.  I did briefly trial him on Pletal which afforded him some relief but he stopped it.  He was referred to Dr. Randie Heinz by Dr. Linna Caprice for vascular evaluation prior to his hip replacement and Dr. Randie Heinz has continue to follow his lower extremity Doppler studies.

## 2022-08-25 NOTE — Assessment & Plan Note (Signed)
History of essential hypertension a blood pressure measured today at 124/72.  He is on Toprol, and ramipril.

## 2022-08-25 NOTE — Progress Notes (Signed)
08/25/2022 Glenn Collins   Apr 30, 1945  [id number]  Primary Physician de [place], Glenn J, MD Primary Cardiologist: Glenn Gess MD Glenn Collins, [place]  HPI:  Glenn Collins is a 77 y.o.  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    I last saw him in the office 08/03/2021.  He does have a history of CAD status post RCA and LAD stenting in 2007 with 3 intervention in 2017 of the RCA for in-stent restenosis.  History of hypertension and hyperlipidemia.  She never had a heart attack or stroke.  He denies chest pain or shortness of breath.  He smoked remotely but stopped 20 years ago.  He has a history of claudication which is fairly symmetric and lifestyle limiting especially walking up an incline.  He was evaluated in [place] and told he had "blockages".   I did begin him on Pletal which has resulted in significant improvement in his claudication symptoms.  He had carotid Dopplers that showed no evidence of ICA stenosis.   He apparently had several syncopal episodes and was noted to be bradycardic.  His beta-blocker was held but he continued to have bradycardia related syncope and ultimately underwent dual-chamber permanent transvenous pacemaker insertion by Dr. Ladona Collins 1 week ago.  His Pletal was discontinued at that time as the plus his beta-blocker.     Since I saw him in the office a year ago he continues to do well.  He denies chest pain, shortness of breath.  Glenn Collins is following him for his PAD.  He does have moderate left ICA stenosis as well as moderate aortic stenosis which he is asymptomatic from.   Current Meds  Medication Sig   aspirin EC 81 MG tablet Take 81 mg by mouth daily. Swallow whole.   atorvastatin (LIPITOR) 80 MG tablet TAKE ONE TABLET BY MOUTH DAILY AT 6PM.  PLEASE SCHEDULE YEARLY APPOINTMENT FOR FUTURE REFILLS. THANK YOU   diphenhydrAMINE (BENADRYL) 25 MG tablet Take 25 mg by mouth at bedtime as needed for sleep or allergies.   ezetimibe (ZETIA) 10 MG tablet Take 1 tablet (10 mg total) by mouth daily.   metoprolol succinate (TOPROL-XL) 25 MG 24 hr tablet TAKE 1 TABLET BY MOUTH DAILY   Multiple Vitamin (MULTIVITAMIN WITH MINERALS) TABS tablet Take 1 tablet by mouth daily.   ondansetron (ZOFRAN) 4 MG tablet Take 1 tablet (4 mg total) by mouth every 6 (six) hours as needed for nausea.   ramipril (ALTACE) 10 MG capsule Take 1 capsule (10 mg total) by mouth daily.   sodium chloride (OCEAN) 0.65 % SOLN nasal spray Place 1 spray into both nostrils as needed for congestion.     No Known Allergies  Social History   Socioeconomic History   Marital status: Married    Spouse name: Not on file   Number of children: Not on file   Years of education: Not on file   Highest education level: Not on file  Occupational History   Not on file  Tobacco Use   Smoking status: Former    Packs/day: 1.50    Years: 40.00    Additional pack years: 0.00    Total pack years: 60.00    Types: Cigarettes    Quit date: 10/15/2000    Years since quitting: 21.8  Smokeless tobacco: Never  Vaping Use   Vaping Use: Never used  Substance and Sexual Activity   Alcohol use: Yes    Alcohol/week: 28.0 standard drinks of alcohol    Types: 28 Glasses of wine per week    Comment: 4 glasses per day   Drug use: Never   Sexual activity: Not on file  Other Topics Concern   Not on file  Social History Narrative   Not on file   Social Determinants of Health   Financial Resource Strain: Low Risk  (08/26/2021)   Overall Financial Resource Strain (CARDIA)    Difficulty of Paying Living Expenses: Not hard at all  Food Insecurity: No Food Insecurity (08/26/2021)   Hunger Vital Sign    Worried About Running Out of Food in the Last Year: Never true    Ran Out of Food in the Last  Year: Never true  Transportation Needs: No Transportation Needs (08/26/2021)   PRAPARE - Administrator, Civil Service (Medical): No    Lack of Transportation (Non-Medical): No  Physical Activity: Insufficiently Active (08/26/2021)   Exercise Vital Sign    Days of Exercise per Week: 3 days    Minutes of Exercise per Session: 20 min  Stress: No Stress Concern Present (08/26/2021)   [employer] of Occupational Health - Occupational Stress Questionnaire    Feeling of Stress : Not at all  Social Connections: Socially Isolated (08/26/2021)   Social Connection and Isolation Panel [NHANES]    Frequency of Communication with Friends and Family: Never    Frequency of Social Gatherings with Friends and Family: Never    Attends Religious Services: Never    Database administrator or Organizations: No    Attends Banker Meetings: Never    Marital Status: Married  Catering manager Violence: Unknown (08/26/2021)   Humiliation, Afraid, Rape, and Kick questionnaire    Fear of Current or Ex-Partner: No    Emotionally Abused: Not on file    Physically Abused: No    Sexually Abused: No     Review of Systems: General: negative for chills, fever, night sweats or weight changes.  Cardiovascular: negative for chest pain, dyspnea on exertion, edema, orthopnea, palpitations, paroxysmal nocturnal dyspnea or shortness of breath Dermatological: negative for rash Respiratory: negative for cough or wheezing Urologic: negative for hematuria Abdominal: negative for nausea, vomiting, diarrhea, bright red blood per rectum, melena, or hematemesis Neurologic: negative for visual changes, syncope, or dizziness All other systems reviewed and are otherwise negative except as noted above.    Blood pressure 124/72, pulse 82, height 5\' 11"  (1.803 m), weight 211 lb 6.4 oz (95.9 kg), SpO2 96 %.  General appearance: alert and no distress Neck: no adenopathy, no JVD, supple, symmetrical, trachea  midline, thyroid not enlarged, symmetric, no tenderness/mass/nodules, and bilateral carotid bruits Lungs: clear to auscultation bilaterally Heart: 2/6 outflow tract murmur consistent with aortic stenosis Extremities: extremities normal, atraumatic, no cyanosis or edema Pulses: Diminished pedal pulses Skin: Skin color, texture, turgor normal. No rashes or lesions Neurologic: Grossly normal  EKG atrially sensed, ventricular paced rhythm at 82.  Personally reviewed this EKG.  ASSESSMENT AND PLAN:   PAD (peripheral artery disease) (HCC) History of PAD with lower extremity Dopplers which I performed 01/07/2019 revealing occluded SFAs bilaterally.  I did briefly trial him on Pletal which afforded him some relief but he stopped it.  He was referred to Glenn Collins by Dr. Linna Caprice for vascular evaluation prior to his hip  replacement and Glenn Collins has continue to follow his lower extremity Doppler studies.  Hypertension History of essential hypertension a blood pressure measured today at 124/72.  He is on Toprol, and ramipril.  CAD (coronary artery disease) History of CAD status post RCA and LAD stenting in 2007 with 3 interventions in 2017 the RCA for in-stent restenosis.  He denies chest pain.  Bilateral carotid bruits History of carotid artery disease with Doppler studies performed 12/20/2021 revealing moderate left ICA stenosis.  This will be repeated on an annual basis.  Hyperlipidemia History of hyperlipidemia on high-dose statin therapy with lipid profile performed 05/09/2022 revealing total cholesterol 174, LDL of 84 and HDL of 76.  He is not at goal for secondary prevention.  I am going to add Zetia 10 mg a day and we will recheck a lipid liver profile in 3 months.  Complete heart block (HCC) History of complete heart block status post permanent transvenous pacemaker insertion by Dr. Ladona Collins 09/29/2019 who follows this.  Stenosis of left subclavian artery (HCC) Right subclavian artery stenosis by  duplex ultrasound 12/20/2021 with symmetric blood pressures and antegrade blood flow.  He denies any symptoms.  Will continue to follow this by carotid Doppler studies.  Aortic stenosis Moderate aortic stenosis by 2D echo performed 08/31/2021 with an aortic valve area of 1.37 cm and a mean gradient of 25.  Will follow this on annual basis.     Glenn Gess MD FACP,FACC,FAHA, [facility] 08/25/2022 11:15 AM

## 2022-08-25 NOTE — Assessment & Plan Note (Signed)
History of hyperlipidemia on high-dose statin therapy with lipid profile performed 05/09/2022 revealing total cholesterol 174, LDL of 84 and HDL of 76.  He is not at goal for secondary prevention.  I am going to add Zetia 10 mg a day and we will recheck a lipid liver profile in 3 months.

## 2022-08-25 NOTE — Patient Instructions (Signed)
Medication Instructions:  Your physician has recommended you make the following change in your medication:   -Start ezetimibe (zetia) 10mg  once daily.  *If you need a refill on your cardiac medications before your next appointment, please call your pharmacy*   Lab Work: Your physician recommends that you return for lab work in: 3 months for FASTING lipid/liver panel  If you have labs (blood work) drawn today and your tests are completely normal, you will receive your results only by: MyChart Message (if you have MyChart) OR A paper copy in the mail If you have any lab test that is abnormal or we need to change your treatment, we will call you to review the results.   Testing/Procedures: Your physician has requested that you have an echocardiogram. Echocardiography is a painless test that uses sound waves to create images of your heart. It provides your doctor with information about the size and shape of your heart and how well your heart's chambers and valves are working. This procedure takes approximately one hour. There are no restrictions for this procedure. Please do NOT wear cologne, perfume, aftershave, or lotions (deodorant is allowed). Please arrive 15 minutes prior to your appointment time. This will take place at [address] [place]   Your physician has requested that you have a carotid duplex. This test is an ultrasound of the carotid arteries in your neck. It looks at blood flow through these arteries that supply the brain with blood. Allow one hour for this exam. There are no restrictions or special instructions. This will take place at [address], [address]. To be done in September.   Follow-Up: At [facility], you and your health needs are our priority.  As part of our continuing mission to provide you with exceptional heart care, we have created designated Provider Care Teams.  These Care Teams include your primary Cardiologist (physician) and  Advanced Practice Providers (APPs -  Physician Assistants and Nurse Practitioners) who all work together to provide you with the care you need, when you need it.  We recommend signing up for the patient portal called "MyChart".  Sign up information is provided on this After Visit Summary.  MyChart is used to connect with patients for Virtual Visits (Telemedicine).  Patients are able to view lab/test results, encounter notes, upcoming appointments, etc.  Non-urgent messages can be sent to your provider as well.   To learn more about what you can do with MyChart, go to [email or web address].    Your next appointment:   12 month(s)  Provider:   Nanetta Batty, MD

## 2022-08-25 NOTE — Assessment & Plan Note (Signed)
Right subclavian artery stenosis by duplex ultrasound 12/20/2021 with symmetric blood pressures and antegrade blood flow.  He denies any symptoms.  Will continue to follow this by carotid Doppler studies.

## 2022-08-25 NOTE — Assessment & Plan Note (Signed)
Moderate aortic stenosis by 2D echo performed 08/31/2021 with an aortic valve area of 1.37 cm and a mean gradient of 25.  Will follow this on annual basis.

## 2022-08-25 NOTE — Assessment & Plan Note (Signed)
History of complete heart block status post permanent transvenous pacemaker insertion by Dr. Ladona Ridgel 09/29/2019 who follows this.

## 2022-08-25 NOTE — Assessment & Plan Note (Signed)
History of carotid artery disease with Doppler studies performed 12/20/2021 revealing moderate left ICA stenosis.  This will be repeated on an annual basis.

## 2022-08-29 ENCOUNTER — Encounter (HOSPITAL_BASED_OUTPATIENT_CLINIC_OR_DEPARTMENT_OTHER): Payer: Self-pay | Admitting: Family Medicine

## 2022-08-29 ENCOUNTER — Ambulatory Visit (HOSPITAL_BASED_OUTPATIENT_CLINIC_OR_DEPARTMENT_OTHER): Payer: Medicare Other | Admitting: Family Medicine

## 2022-08-29 VITALS — BP 132/85 | HR 90 | Temp 97.7°F | Ht 71.0 in | Wt 210.0 lb

## 2022-08-29 DIAGNOSIS — I10 Essential (primary) hypertension: Secondary | ICD-10-CM | POA: Diagnosis not present

## 2022-08-29 DIAGNOSIS — E782 Mixed hyperlipidemia: Secondary | ICD-10-CM | POA: Diagnosis not present

## 2022-08-29 NOTE — Progress Notes (Signed)
  Electrophysiology Office Note:   Date:  08/30/2022  ID:  Glenn Collins, DOB 07/19/1945, MRN [id number]  Primary Cardiologist: Nanetta Batty, MD Electrophysiologist: Lewayne Bunting, MD   History of Present Illness:   Glenn Collins is a 77 y.o. male with h/o CHB and PAD seen today for routine electrophysiology followup.   Since last being seen in our clinic the patient reports doing very well.  he denies chest pain, palpitations, dyspnea, PND, orthopnea, nausea, vomiting, dizziness, syncope, edema, weight gain, or early satiety.   Review of systems complete and found to be negative unless listed in HPI.   Device History: Biotronik Dual Chamber PPM implanted 09/2019 for CHB  Studies Reviewed:    PPM Interrogation-  reviewed in detail today,  See PACEART report.  EKG is not ordered today. EKG from 08/25/22 reviewed which showed AS-VP at 82 bpm    Physical Exam:   VS:  BP 134/68   Pulse 99   Ht 5\' 11"  (1.803 m)   Wt 212 lb (96.2 kg)   SpO2 98%   BMI 29.57 kg/m    Wt Readings from Last 3 Encounters:  08/30/22 212 lb (96.2 kg)  08/29/22 210 lb (95.3 kg)  08/25/22 211 lb 6.4 oz (95.9 kg)     GEN: Well nourished, well developed in no acute distress NECK: No JVD; No carotid bruits CARDIAC: Regular rate and rhythm, no murmurs, rubs, gallops RESPIRATORY:  Clear to auscultation without rales, wheezing or rhonchi  ABDOMEN: Soft, non-tender, non-distended EXTREMITIES:  No edema; No deformity   ASSESSMENT AND PLAN:    CHB s/p Biotronik PPM  Normal PPM function See Pace Art report No changes today  HTN Stable on current regimen   PAD Follows with Dr. Allyson Sabal   Disposition:   Follow up with Dr. Ladona Ridgel in 12 months  Signed, Graciella Freer, PA-C

## 2022-08-29 NOTE — Assessment & Plan Note (Signed)
Continues with atorvastatin, denies any issues with medication.  He was all started on Zetia recently by cardiology to assist with better control.  Denies any issues since starting medication.  He will be having repeat lipid panel drawn with cardiology in the next couple months.  No concerns related to this today.

## 2022-08-29 NOTE — Progress Notes (Signed)
    Procedures performed today:    None.  Independent interpretation of notes and tests performed by another provider:   None.  Brief History, Exam, Impression, and Recommendations:    BP 132/85   Pulse 90   Temp 97.7 F (36.5 C) (Oral)   Ht 5\' 11"  (1.803 m)   Wt 210 lb (95.3 kg)   SpO2 99%   BMI 29.29 kg/m   Hypertension Blood pressure slightly elevated on initial reading, improved on recheck.  No changes to be made to medications today.  Recommend intermittent monitoring of blood pressure at home, DASH diet  Hyperlipidemia Continues with atorvastatin, denies any issues with medication.  He was all started on Zetia recently by cardiology to assist with better control.  Denies any issues since starting medication.  He will be having repeat lipid panel drawn with cardiology in the next couple months.  No concerns related to this today.  He reports that abscess has healed.  He did establish with dermatologist.  No concerns today related to this.  Return in about 5 months (around 01/29/2023) for HTN, HLD.   ___________________________________________ Glenn Mwangi de [place], MD, ABFM, [facility]

## 2022-08-29 NOTE — Assessment & Plan Note (Signed)
Blood pressure slightly elevated on initial reading, improved on recheck.  No changes to be made to medications today.  Recommend intermittent monitoring of blood pressure at home, DASH diet

## 2022-08-30 ENCOUNTER — Encounter: Payer: Self-pay | Admitting: Student

## 2022-08-30 ENCOUNTER — Ambulatory Visit: Payer: Medicare Other | Attending: Student | Admitting: Student

## 2022-08-30 VITALS — BP 134/68 | HR 99 | Ht 71.0 in | Wt 212.0 lb

## 2022-08-30 DIAGNOSIS — I10 Essential (primary) hypertension: Secondary | ICD-10-CM

## 2022-08-30 DIAGNOSIS — I442 Atrioventricular block, complete: Secondary | ICD-10-CM

## 2022-08-30 LAB — CUP PACEART INCLINIC DEVICE CHECK
Date Time Interrogation Session: 20240515095258
Implantable Lead Connection Status: 753985
Implantable Lead Connection Status: 753985
Implantable Lead Implant Date: 20210614
Implantable Lead Implant Date: 20210614
Implantable Lead Location: 753859
Implantable Lead Location: 753860
Implantable Lead Model: 377171
Implantable Lead Model: 377171
Implantable Lead Serial Number: 7000244971
Implantable Lead Serial Number: 7000271886
Implantable Pulse Generator Implant Date: 20210614
Pulse Gen Model: 407145
Pulse Gen Serial Number: 69870190

## 2022-08-30 NOTE — Patient Instructions (Signed)
Medication Instructions:  Your physician recommends that you continue on your current medications as directed. Please refer to the Current Medication list given to you today.  *If you need a refill on your cardiac medications before your next appointment, please call your pharmacy*  Lab Work: None ordered If you have labs (blood work) drawn today and your tests are completely normal, you will receive your results only by: MyChart Message (if you have MyChart) OR A paper copy in the mail If you have any lab test that is abnormal or we need to change your treatment, we will call you to review the results.  Follow-Up: At [facility], you and your health needs are our priority.  As part of our continuing mission to provide you with exceptional heart care, we have created designated Provider Care Teams.  These Care Teams include your primary Cardiologist (physician) and Advanced Practice Providers (APPs -  Physician Assistants and Nurse Practitioners) who all work together to provide you with the care you need, when you need it.  Your next appointment:   1 year(s)  Provider:   Gregg Taylor, MD  

## 2022-09-19 ENCOUNTER — Encounter (HOSPITAL_BASED_OUTPATIENT_CLINIC_OR_DEPARTMENT_OTHER): Payer: Self-pay

## 2022-09-19 ENCOUNTER — Ambulatory Visit (INDEPENDENT_AMBULATORY_CARE_PROVIDER_SITE_OTHER): Payer: Medicare Other

## 2022-09-19 VITALS — BP 134/68 | Ht 71.0 in | Wt 205.0 lb

## 2022-09-19 DIAGNOSIS — Z0000 Encounter for general adult medical examination without abnormal findings: Secondary | ICD-10-CM

## 2022-09-19 NOTE — Progress Notes (Signed)
 I connected with  [name] on 09/19/22 by a audio enabled telemedicine application and verified that I am speaking with the correct person using two identifiers.  Patient Location: Home  Provider Location: Office/Clinic  I discussed the limitations of evaluation and management by telemedicine. The patient expressed understanding and agreed to proceed.  Subjective:   Glenn Collins is a 77 y.o. male who presents for Medicare Annual/Subsequent preventive examination.  Review of Systems     Cardiac Risk Factors include: advanced age (>40men, >52 women);male gender;dyslipidemia;hypertension     Objective:    Today's Vitals   09/19/22 1011  BP: 134/68  Weight: 205 lb (93 kg)  Height: 5\' 11"  (1.803 m)   Body mass index is 28.59 kg/m.     09/19/2022   10:19 AM 06/15/2021    1:18 PM 06/08/2021    2:12 PM 12/29/2020    2:29 PM 10/06/2019    7:17 PM 09/28/2019   10:15 PM 09/22/2019   12:56 AM  Advanced Directives  Does Patient Have a Medical Advance Directive? No No No No No No No  Would patient like information on creating a medical advance directive? No - Patient declined No - Patient declined No - Patient declined  No - Patient declined No - Patient declined No - Patient declined    Current Medications (verified) Outpatient Encounter Medications as of 09/19/2022  Medication Sig   aspirin EC 81 MG tablet Take 81 mg by mouth daily. Swallow whole.   atorvastatin (LIPITOR) 80 MG tablet TAKE ONE TABLET BY MOUTH DAILY AT 6PM. PLEASE SCHEDULE YEARLY APPOINTMENT FOR FUTURE REFILLS. THANK YOU   ezetimibe (ZETIA) 10 MG tablet Take 1 tablet (10 mg total) by mouth daily.   metoprolol succinate (TOPROL-XL) 25 MG 24 hr tablet TAKE 1 TABLET BY MOUTH DAILY   Multiple Vitamin (MULTIVITAMIN WITH MINERALS) TABS tablet Take 1 tablet by mouth daily.   ramipril (ALTACE) 10 MG capsule Take 1 capsule (10 mg total) by mouth daily.   sodium chloride (OCEAN) 0.65 % SOLN nasal spray Place 1 spray into  both nostrils as needed for congestion.   diphenhydrAMINE (BENADRYL) 25 MG tablet Take 25 mg by mouth at bedtime as needed for sleep or allergies. (Patient not taking: Reported on 09/19/2022)   nitroGLYCERIN (NITROSTAT) 0.4 MG SL tablet Place 1 tablet (0.4 mg total) under the tongue every 5 (five) minutes x 3 doses as needed for chest pain.   ondansetron (ZOFRAN) 4 MG tablet Take 1 tablet (4 mg total) by mouth every 6 (six) hours as needed for nausea. (Patient not taking: Reported on 09/19/2022)   No facility-administered encounter medications on file as of 09/19/2022.    Allergies (verified) Patient has no known allergies.   History: Past Medical History:  Diagnosis Date   Arthritis    CAD (coronary artery disease) 10/27/2018   Coronary artery disease    Heart murmur Since childhood   Hypertension    PAD (peripheral artery disease) (HCC) 10/27/2018   Presence of permanent cardiac pacemaker    Biotronik PPM   Past Surgical History:  Procedure Laterality Date   CARDIAC SURGERY  2019   with stents x2   HERNIA REPAIR  2012   PACEMAKER IMPLANT N/A 09/29/2019   Procedure: PACEMAKER IMPLANT;  Surgeon: Marinus Maw, MD;  Location: [facility];  Service: Cardiovascular;  Laterality: N/A;   TOTAL HIP ARTHROPLASTY Right 06/15/2021   Procedure: TOTAL HIP ARTHROPLASTY ANTERIOR APPROACH;  Surgeon: Samson Frederic, MD;  Location: [facility];  Service: Orthopedics;  Laterality: Right;   Family History  Problem Relation Age of Onset   Cancer Mother    Hearing loss Mother    CVA Father    Social History   Socioeconomic History   Marital status: Married    Spouse name: Not on file   Number of children: Not on file   Years of education: Not on file   Highest education level: Doctorate  Occupational History   Not on file  Tobacco Use   Smoking status: Former    Packs/day: 1.50    Years: 40.00    Additional pack years: 0.00    Total pack years: 60.00    Types: Cigarettes    Quit  date: 10/15/2000    Years since quitting: 21.9   Smokeless tobacco: Never  Vaping Use   Vaping Use: Never used  Substance and Sexual Activity   Alcohol use: Yes    Alcohol/week: 28.0 standard drinks of alcohol    Types: 28 Glasses of wine per week    Comment: 4 glasses per day   Drug use: Never   Sexual activity: Not on file  Other Topics Concern   Not on file  Social History Narrative   Not on file   Social Determinants of Health   Financial Resource Strain: Low Risk  (09/19/2022)   Overall Financial Resource Strain (CARDIA)    Difficulty of Paying Living Expenses: Not hard at all  Food Insecurity: No Food Insecurity (09/19/2022)   Hunger Vital Sign    Worried About Running Out of Food in the Last Year: Never true    Ran Out of Food in the Last Year: Never true  Transportation Needs: No Transportation Needs (09/19/2022)   PRAPARE - Administrator, Civil Service (Medical): No    Lack of Transportation (Non-Medical): No  Physical Activity: Sufficiently Active (09/19/2022)   Exercise Vital Sign    Days of Exercise per Week: 7 days    Minutes of Exercise per Session: 30 min  Recent Concern: Physical Activity - Insufficiently Active (08/27/2022)   Exercise Vital Sign    Days of Exercise per Week: 3 days    Minutes of Exercise per Session: 30 min  Stress: No Stress Concern Present (09/19/2022)   [employer] of Occupational Health - Occupational Stress Questionnaire    Feeling of Stress : Not at all  Social Connections: Moderately Isolated (09/19/2022)   Social Connection and Isolation Panel [NHANES]    Frequency of Communication with Friends and Family: Once a week    Frequency of Social Gatherings with Friends and Family: Once a week    Attends Religious Services: 1 to 4 times per year    Active Member of [employer] or Organizations: No    Attends Engineer, structural: Never    Marital Status: Married    Tobacco Counseling Counseling given: Yes   Clinical  Intake:  Pre-visit preparation completed: Yes  Pain : No/denies pain     BMI - recorded: 28.59 Nutritional Risks: None Diabetes: No  How often do you need to have someone help you when you read instructions, pamphlets, or other written materials from your doctor or pharmacy?: 1 - Never  Diabetic? No  Interpreter Needed?: No  Information entered by ::  Accalia Rigdon, CMA   Activities of Daily Living    09/19/2022   10:18 AM 05/15/2022   10:28 AM  In your present state of health, do you have any  difficulty performing the following activities:  Hearing? 0 0  Vision? 0 0  Difficulty concentrating or making decisions? 0 0  Walking or climbing stairs? 0 0  Dressing or bathing? 0 0  Doing errands, shopping? 0 0  Preparing Food and eating ? N   Using the Toilet? N   In the past six months, have you accidently leaked urine? N   Do you have problems with loss of bowel control? N   Managing your Medications? N   Managing your Finances? N   Housekeeping or managing your Housekeeping? N     Patient Care Team: de [place], Buren Kos, MD as PCP - General (Family Medicine) Runell Gess, MD as PCP - Cardiology (Cardiology) Marinus Maw, MD as PCP - Electrophysiology (Cardiology)  Indicate any recent Medical Services you may have received from other than [facility] providers in the past year (date may be approximate).     Assessment:   This is a routine wellness examination for Glenn Collins.  Hearing/Vision screen Hearing Screening - Comments:: Patient denies any hearing difficulties.   Vision Screening - Comments:: Does not have an eye doctor and declines referral today  Dietary issues and exercise activities discussed: Current Exercise Habits: Home exercise routine, Type of exercise: strength training/weights;walking, Time (Minutes): 30, Frequency (Times/Week): 7, Weekly Exercise (Minutes/Week): 210, Intensity: Mild, Exercise limited by: None identified   Goals Addressed              This Visit's Progress    Patient Stated       Patients goal is to remain as active as he currently is.        Depression Screen    09/19/2022   10:15 AM 08/29/2022    2:26 PM 05/15/2022   10:28 AM 04/18/2022   10:52 AM 04/03/2022    2:45 PM 03/27/2022   11:05 AM 03/21/2022    3:11 PM  PHQ 2/9 Scores  PHQ - 2 Score 0 0 0 0 0 0 0  PHQ- 9 Score   0 0 0 0 0  Exception Documentation  Medical reason Medical reason Medical reason Medical reason Medical reason Medical reason    Fall Risk    09/19/2022   10:14 AM 08/29/2022    2:26 PM 05/15/2022   10:27 AM 04/18/2022   10:52 AM 04/03/2022    2:45 PM  Fall Risk   Falls in the past year? 0 0 0 0 0  Number falls in past yr: 0 0 0 0 0  Injury with Fall? 0 0 0 0 0  Risk for fall due to : No Fall Risks No Fall Risks No Fall Risks No Fall Risks No Fall Risks  Follow up Falls prevention discussed Falls evaluation completed Falls evaluation completed Falls evaluation completed Falls evaluation completed    FALL RISK PREVENTION PERTAINING TO THE HOME:  Any stairs in or around the home? No  If so, are there any without handrails? No  Home free of loose throw rugs in walkways, pet beds, electrical cords, etc? Yes  Adequate lighting in your home to reduce risk of falls? Yes   ASSISTIVE DEVICES UTILIZED TO PREVENT FALLS:  Life alert? No  Use of a cane, walker or w/c? No  Grab bars in the bathroom? No  Shower chair or bench in shower? No  Elevated toilet seat or a handicapped toilet? No   TIMED UP AND GO:  Was the test performed? No .  Cognitive Function:  09/19/2022   10:19 AM 08/26/2021   12:58 PM  6CIT Screen  What Year? 0 points 0 points  What month? 0 points   What time? 0 points 0 points  Count back from 20 0 points 0 points  Months in reverse 0 points 0 points  Repeat phrase 2 points 2 points  Total Score 2 points     Immunizations Immunization History  Administered Date(s) Administered   COVID-19, mRNA,  vaccine(Comirnaty)12 years and older 02/08/2022   Fluad Quad(high Dose 65+) 02/10/2021, 02/08/2022   Moderna Covid-19 Vaccine Bivalent Booster 101yrs & up 02/10/2021, 08/19/2021   Moderna SARS-COV2 Booster Vaccination 10/05/2020   PFIZER(Purple Top)SARS-COV-2 Vaccination 05/25/2019, 06/19/2019, 01/31/2020   Pneumococcal Conjugate-13 03/14/2021   Tdap 08/28/2015    TDAP status: Up to date  Flu Vaccine status: Up to date  Pneumococcal vaccine status: Due, Education has been provided regarding the importance of this vaccine. Advised may receive this vaccine at local pharmacy or Health Dept. Aware to provide a copy of the vaccination record if obtained from local pharmacy or Health Dept. Verbalized acceptance and understanding.  Covid-19 vaccine status: Information provided on how to obtain vaccines.   Qualifies for Shingles Vaccine? Yes   Zostavax completed No   Shingrix Completed?: No.    Education has been provided regarding the importance of this vaccine. Patient has been advised to call insurance company to determine out of pocket expense if they have not yet received this vaccine. Advised may also receive vaccine at local pharmacy or Health Dept. Verbalized acceptance and understanding.  Screening Tests Health Maintenance  Topic Date Due   Hepatitis C Screening  Never done   Zoster Vaccines- Shingrix (1 of 2) Never done   Pneumonia Vaccine 13+ Years old (2 of 2 - PPSV23 or PCV20) 03/14/2022   COVID-19 Vaccine (7 - 2023-24 season) 04/05/2022   Medicare Annual Wellness (AWV)  08/27/2022   INFLUENZA VACCINE  11/16/2022   DTaP/Tdap/Td (2 - Td or Tdap) 08/27/2025   HPV VACCINES  Aged Out    Health Maintenance  Health Maintenance Due  Topic Date Due   Hepatitis C Screening  Never done   Zoster Vaccines- Shingrix (1 of 2) Never done   Pneumonia Vaccine 55+ Years old (2 of 2 - PPSV23 or PCV20) 03/14/2022   COVID-19 Vaccine (7 - 2023-24 season) 04/05/2022   Medicare Annual Wellness  (AWV)  08/27/2022    Colorectal: Patient declined screening  Lung Cancer Screening: (Low Dose CT Chest recommended if Age 19-80 years, 30 pack-year currently smoking OR have quit w/in 15years.) does not qualify.   Lung Cancer Screening Referral: N/A  Additional Screening:  Hepatitis C Screening: does qualify; Patient states he wants to discuss with his provider before doing  Vision Screening: Recommended annual ophthalmology exams for early detection of glaucoma and other disorders of the eye. Is the patient up to date with their annual eye exam?  No  Who is the provider or what is the name of the office in which the patient attends annual eye exams? Does not have an eye doctor If pt is not established with a provider, would they like to be referred to a provider to establish care? No .   Dental Screening: Recommended annual dental exams for proper oral hygiene  Community Resource Referral / Chronic Care Management: CRR required this visit?  No   CCM required this visit?  No      Plan:     I have personally reviewed and  noted the following in the patient's chart:   Medical and social history Use of alcohol, tobacco or illicit drugs  Current medications and supplements including opioid prescriptions. Patient is not currently taking opioid prescriptions. Functional ability and status Nutritional status Physical activity Advanced directives List of other physicians Hospitalizations, surgeries, and ER visits in previous 12 months Vitals Screenings to include cognitive, depression, and falls Referrals and appointments  In addition, I have reviewed and discussed with patient certain preventive protocols, quality metrics, and best practice recommendations. A written personalized care plan for preventive services as well as general preventive health recommendations were provided to patient.   Due to this being a telephonic visit, the after visit summary with patients  personalized plan was offered to patient via mail or my-chart. Patient would like to access their AVS via my-chart    Jordan Hawks Manjinder Breau, CMA   09/19/2022   Nurse Notes: Please discuss Hep C screening at next visit

## 2022-09-19 NOTE — Patient Instructions (Signed)
[name] , Thank you for taking time to come for your Medicare Wellness Visit. I appreciate your ongoing commitment to your health goals. Please review the following plan we discussed and let me know if I can assist you in the future.   These are the goals we discussed:  Goals      Patient Stated     Patients goal is to remain as active as he currently is.         This is a list of the screening recommended for you and due dates:  Health Maintenance  Topic Date Due   Hepatitis C Screening  Never done   Zoster (Shingles) Vaccine (1 of 2) Never done   Pneumonia Vaccine (2 of 2 - PPSV23 or PCV20) 03/14/2022   COVID-19 Vaccine (7 - 2023-24 season) 04/05/2022   Medicare Annual Wellness Visit  08/27/2022   Flu Shot  11/16/2022   DTaP/Tdap/Td vaccine (2 - Td or Tdap) 08/27/2025   HPV Vaccine  Aged Out    Advanced directives: Advance directive discussed with you today. Even though you declined this today, please call our office should you change your mind, and we can give you the proper paperwork for you to fill out. Advance care planning is a way to make decisions about medical care that fits your values in case you are ever unable to make these decisions for yourself.  Information on Advanced Care Planning can be found at [facility] Advance Health Care Directives Advance Health Care Directives ([email or web address]    Conditions/risks identified: Discuss a Hepatitis C Screening with Dr. De Peru at your next visit  Next appointment: Follow up in one year for your annual wellness visit. September 25, 2023 at 10am TELEPHONE VISIT  Preventive Care 77 Years and Older, Male  Preventive care refers to lifestyle choices and visits with your health care provider that can promote health and wellness. What does preventive care include? A yearly physical exam. This is also called an annual well check. Dental exams once or twice a year. Routine eye exams. Ask your health care provider  how often you should have your eyes checked. Personal lifestyle choices, including: Daily care of your teeth and gums. Regular physical activity. Eating a healthy diet. Avoiding tobacco and drug use. Limiting alcohol use. Practicing safe sex. Taking low doses of aspirin every day. Taking vitamin and mineral supplements as recommended by your health care provider. What happens during an annual well check? The services and screenings done by your health care provider during your annual well check will depend on your age, overall health, lifestyle risk factors, and family history of disease. Counseling  Your health care provider may ask you questions about your: Alcohol use. Tobacco use. Drug use. Emotional well-being. Home and relationship well-being. Sexual activity. Eating habits. History of falls. Memory and ability to understand (cognition). Work and work Astronomer. Screening  You may have the following tests or measurements: Height, weight, and BMI. Blood pressure. Lipid and cholesterol levels. These may be checked every 5 years, or more frequently if you are over 71 years old. Skin check. Lung cancer screening. You may have this screening every year starting at age 77 if you have a 30-pack-year history of smoking and currently smoke or have quit within the past 15 years. Fecal occult blood test (FOBT) of the stool. You may have this test every year starting at age 77. Flexible sigmoidoscopy or colonoscopy. You may have a sigmoidoscopy every 5 years  or a colonoscopy every 10 years starting at age 77. Prostate cancer screening. Recommendations will vary depending on your family history and other risks. Hepatitis C blood test. Hepatitis B blood test. Sexually transmitted disease (STD) testing. Diabetes screening. This is done by checking your blood sugar (glucose) after you have not eaten for a while (fasting). You may have this done every 1-3 years. Abdominal aortic aneurysm  (AAA) screening. You may need this if you are a current or former smoker. Osteoporosis. You may be screened starting at age 77 if you are at high risk. Talk with your health care provider about your test results, treatment options, and if necessary, the need for more tests. Vaccines  Your health care provider may recommend certain vaccines, such as: Influenza vaccine. This is recommended every year. Tetanus, diphtheria, and acellular pertussis (Tdap, Td) vaccine. You may need a Td booster every 10 years. Zoster vaccine. You may need this after age 77. Pneumococcal 13-valent conjugate (PCV13) vaccine. One dose is recommended after age 40. Pneumococcal polysaccharide (PPSV23) vaccine. One dose is recommended after age 77. Talk to your health care provider about which screenings and vaccines you need and how often you need them. This information is not intended to replace advice given to you by your health care provider. Make sure you discuss any questions you have with your health care provider. Document Released: 04/30/2015 Document Revised: 12/22/2015 Document Reviewed: 02/02/2015 Elsevier Interactive Patient Education  2017 [employer].  Fall Prevention in the Home Falls can cause injuries. They can happen to people of all ages. There are many things you can do to make your home safe and to help prevent falls. What can I do on the outside of my home? Regularly fix the edges of walkways and driveways and fix any cracks. Remove anything that might make you trip as you walk through a door, such as a raised step or threshold. Trim any bushes or trees on the path to your home. Use bright outdoor lighting. Clear any walking paths of anything that might make someone trip, such as rocks or tools. Regularly check to see if handrails are loose or broken. Make sure that both sides of any steps have handrails. Any raised decks and porches should have guardrails on the edges. Have any leaves, snow, or  ice cleared regularly. Use sand or salt on walking paths during winter. Clean up any spills in your garage right away. This includes oil or grease spills. What can I do in the bathroom? Use night lights. Install grab bars by the toilet and in the tub and shower. Do not use towel bars as grab bars. Use non-skid mats or decals in the tub or shower. If you need to sit down in the shower, use a plastic, non-slip stool. Keep the floor dry. Clean up any water that spills on the floor as soon as it happens. Remove soap buildup in the tub or shower regularly. Attach bath mats securely with double-sided non-slip rug tape. Do not have throw rugs and other things on the floor that can make you trip. What can I do in the bedroom? Use night lights. Make sure that you have a light by your bed that is easy to reach. Do not use any sheets or blankets that are too big for your bed. They should not hang down onto the floor. Have a firm chair that has side arms. You can use this for support while you get dressed. Do not have throw rugs and  other things on the floor that can make you trip. What can I do in the kitchen? Clean up any spills right away. Avoid walking on wet floors. Keep items that you use a lot in easy-to-reach places. If you need to reach something above you, use a strong step stool that has a grab bar. Keep electrical cords out of the way. Do not use floor polish or wax that makes floors slippery. If you must use wax, use non-skid floor wax. Do not have throw rugs and other things on the floor that can make you trip. What can I do with my stairs? Do not leave any items on the stairs. Make sure that there are handrails on both sides of the stairs and use them. Fix handrails that are broken or loose. Make sure that handrails are as long as the stairways. Check any carpeting to make sure that it is firmly attached to the stairs. Fix any carpet that is loose or worn. Avoid having throw rugs at  the top or bottom of the stairs. If you do have throw rugs, attach them to the floor with carpet tape. Make sure that you have a light switch at the top of the stairs and the bottom of the stairs. If you do not have them, ask someone to add them for you. What else can I do to help prevent falls? Wear shoes that: Do not have high heels. Have rubber bottoms. Are comfortable and fit you well. Are closed at the toe. Do not wear sandals. If you use a stepladder: Make sure that it is fully opened. Do not climb a closed stepladder. Make sure that both sides of the stepladder are locked into place. Ask someone to hold it for you, if possible. Clearly mark and make sure that you can see: Any grab bars or handrails. First and last steps. Where the edge of each step is. Use tools that help you move around (mobility aids) if they are needed. These include: Canes. Walkers. Scooters. Crutches. Turn on the lights when you go into a dark area. Replace any light bulbs as soon as they burn out. Set up your furniture so you have a clear path. Avoid moving your furniture around. If any of your floors are uneven, fix them. If there are any pets around you, be aware of where they are. Review your medicines with your doctor. Some medicines can make you feel dizzy. This can increase your chance of falling. Ask your doctor what other things that you can do to help prevent falls. This information is not intended to replace advice given to you by your health care provider. Make sure you discuss any questions you have with your health care provider. Document Released: 01/28/2009 Document Revised: 09/09/2015 Document Reviewed: 05/08/2014 Elsevier Interactive Patient Education  2017 [employer].

## 2022-09-23 ENCOUNTER — Other Ambulatory Visit: Payer: Self-pay | Admitting: Cardiovascular Disease

## 2022-09-26 ENCOUNTER — Ambulatory Visit: Payer: Medicare Other

## 2022-09-26 DIAGNOSIS — I442 Atrioventricular block, complete: Secondary | ICD-10-CM

## 2022-09-27 LAB — CUP PACEART REMOTE DEVICE CHECK
Battery Voltage: 80
Date Time Interrogation Session: 20240611090936
Implantable Lead Connection Status: 753985
Implantable Lead Connection Status: 753985
Implantable Lead Implant Date: 20210614
Implantable Lead Implant Date: 20210614
Implantable Lead Location: 753859
Implantable Lead Location: 753860
Implantable Lead Model: 377171
Implantable Lead Model: 377171
Implantable Lead Serial Number: 7000244971
Implantable Lead Serial Number: 7000271886
Implantable Pulse Generator Implant Date: 20210614
Pulse Gen Model: 407145
Pulse Gen Serial Number: 69870190

## 2022-09-28 ENCOUNTER — Ambulatory Visit (HOSPITAL_COMMUNITY): Payer: Medicare Other | Attending: Cardiology

## 2022-09-28 DIAGNOSIS — I2510 Atherosclerotic heart disease of native coronary artery without angina pectoris: Secondary | ICD-10-CM

## 2022-09-28 DIAGNOSIS — I350 Nonrheumatic aortic (valve) stenosis: Secondary | ICD-10-CM | POA: Diagnosis present

## 2022-09-28 DIAGNOSIS — I10 Essential (primary) hypertension: Secondary | ICD-10-CM | POA: Insufficient documentation

## 2022-09-28 DIAGNOSIS — I6523 Occlusion and stenosis of bilateral carotid arteries: Secondary | ICD-10-CM | POA: Diagnosis present

## 2022-09-28 DIAGNOSIS — E782 Mixed hyperlipidemia: Secondary | ICD-10-CM | POA: Insufficient documentation

## 2022-09-28 LAB — ECHOCARDIOGRAM COMPLETE
AR max vel: 1.06 cm2
AV Area VTI: 1.01 cm2
AV Area mean vel: 1.11 cm2
AV Mean grad: 22 mmHg
AV Peak grad: 44.5 mmHg
Ao pk vel: 3.34 m/s
P 1/2 time: 443 msec
S' Lateral: 3.2 cm

## 2022-10-09 ENCOUNTER — Other Ambulatory Visit: Payer: Self-pay

## 2022-10-09 MED ORDER — ATORVASTATIN CALCIUM 80 MG PO TABS: ORAL_TABLET | ORAL | 3 refills | Status: DC

## 2022-10-23 ENCOUNTER — Other Ambulatory Visit: Payer: Self-pay | Admitting: Cardiovascular Disease

## 2022-10-25 ENCOUNTER — Other Ambulatory Visit: Payer: Self-pay | Admitting: Cardiovascular Disease

## 2022-10-25 NOTE — Progress Notes (Signed)
Remote pacemaker transmission.   

## 2022-12-26 ENCOUNTER — Ambulatory Visit (INDEPENDENT_AMBULATORY_CARE_PROVIDER_SITE_OTHER): Payer: Medicare Other

## 2022-12-26 DIAGNOSIS — I442 Atrioventricular block, complete: Secondary | ICD-10-CM

## 2022-12-27 LAB — CUP PACEART REMOTE DEVICE CHECK
Battery Voltage: 75
Date Time Interrogation Session: 20240910103323
Implantable Lead Connection Status: 753985
Implantable Lead Connection Status: 753985
Implantable Lead Implant Date: 20210614
Implantable Lead Implant Date: 20210614
Implantable Lead Location: 753859
Implantable Lead Location: 753860
Implantable Lead Model: 377171
Implantable Lead Model: 377171
Implantable Lead Serial Number: 7000244971
Implantable Lead Serial Number: 7000271886
Implantable Pulse Generator Implant Date: 20210614
Pulse Gen Model: 407145
Pulse Gen Serial Number: 69870190

## 2022-12-28 ENCOUNTER — Ambulatory Visit (HOSPITAL_COMMUNITY)
Admission: RE | Admit: 2022-12-28 | Discharge: 2022-12-28 | Disposition: A | Discharge status: Home / Self Care | Payer: Medicare Other | Source: Ambulatory Visit | Attending: Internal Medicine | Admitting: Internal Medicine

## 2022-12-28 DIAGNOSIS — E782 Mixed hyperlipidemia: Secondary | ICD-10-CM | POA: Diagnosis present

## 2022-12-28 DIAGNOSIS — I6523 Occlusion and stenosis of bilateral carotid arteries: Secondary | ICD-10-CM | POA: Insufficient documentation

## 2022-12-28 DIAGNOSIS — I10 Essential (primary) hypertension: Secondary | ICD-10-CM | POA: Insufficient documentation

## 2022-12-29 ENCOUNTER — Other Ambulatory Visit (HOSPITAL_BASED_OUTPATIENT_CLINIC_OR_DEPARTMENT_OTHER): Payer: Self-pay

## 2022-12-29 ENCOUNTER — Telehealth: Payer: Self-pay

## 2022-12-29 MED ORDER — COMIRNATY 30 MCG/0.3ML IM SUSY
0.3000 mL | PREFILLED_SYRINGE | Freq: Once | INTRAMUSCULAR | 0 refills | Status: AC
  Filled 2022-12-29: qty 0.3, 1d supply, fill #0

## 2022-12-29 NOTE — Telephone Encounter (Signed)
Spoke with pt regarding recent carotid doppler. Per Dr. Allyson Sabal pt will need to be seen back to discuss further. Pt scheduled for next week. Pt verbalizes understanding.

## 2023-01-02 ENCOUNTER — Encounter: Payer: Self-pay | Admitting: Cardiovascular Disease

## 2023-01-02 ENCOUNTER — Ambulatory Visit: Payer: Medicare Other | Attending: Cardiovascular Disease | Admitting: Cardiovascular Disease

## 2023-01-02 VITALS — BP 120/66 | HR 94 | Ht 71.0 in | Wt 207.0 lb

## 2023-01-02 DIAGNOSIS — I6522 Occlusion and stenosis of left carotid artery: Secondary | ICD-10-CM

## 2023-01-02 NOTE — Progress Notes (Signed)
Glenn Collins returns today for follow-up of his recent carotid Doppler study performed 12/28/2022 that suggest high-grade left ICA stenosis to have progressed since his prior carotid Doppler 1 year ago.  He is completely asymptomatic.  I am going to get a CTA of his neck to confirm severity and if it is concordant we will refer him to Dr. Randie Heinz who follows his PAD for further evaluation and treatment plan.  Otherwise, I will see him back in 6 months.  Runell Gess, M.D., FACP, [facility], [facility] [address] [place], [place]  [address]  [phone] 01/02/2023 11:32 AM

## 2023-01-02 NOTE — Patient Instructions (Signed)
Medication Instructions:  Your physician recommends that you continue on your current medications as directed. Please refer to the Current Medication list given to you today.  *If you need a refill on your cardiac medications before your next appointment, please call your pharmacy*   Lab Work: Your physician recommends that you have labs drawn today: BMET  If you have labs (blood work) drawn today and your tests are completely normal, you will receive your results only by: MyChart Message (if you have MyChart) OR A paper copy in the mail If you have any lab test that is abnormal or we need to change your treatment, we will call you to review the results.   Testing/Procedures: Non-Cardiac CT Angiography (CTA) of neck, is a special type of CT scan that uses a computer to produce multi-dimensional views of major blood vessels throughout the body. In CT angiography, a contrast material is injected through an IV to help visualize the blood vessels.     Follow-Up: At [facility], you and your health needs are our priority.  As part of our continuing mission to provide you with exceptional heart care, we have created designated Provider Care Teams.  These Care Teams include your primary Cardiologist (physician) and Advanced Practice Providers (APPs -  Physician Assistants and Nurse Practitioners) who all work together to provide you with the care you need, when you need it.  We recommend signing up for the patient portal called "MyChart".  Sign up information is provided on this After Visit Summary.  MyChart is used to connect with patients for Virtual Visits (Telemedicine).  Patients are able to view lab/test results, encounter notes, upcoming appointments, etc.  Non-urgent messages can be sent to your provider as well.   To learn more about what you can do with MyChart, go to [email or web address].    Your next appointment:   6 month(s)  Provider:   Nanetta Batty, MD

## 2023-01-04 ENCOUNTER — Other Ambulatory Visit: Payer: Self-pay | Admitting: *Deleted

## 2023-01-04 DIAGNOSIS — E875 Hyperkalemia: Secondary | ICD-10-CM

## 2023-01-08 ENCOUNTER — Telehealth: Payer: Self-pay | Admitting: Cardiovascular Disease

## 2023-01-08 DIAGNOSIS — E875 Hyperkalemia: Secondary | ICD-10-CM

## 2023-01-08 LAB — BASIC METABOLIC PANEL
BUN/Creatinine Ratio: 15 (ref 10–24)
BUN: 15 mg/dL (ref 8–27)
CO2: 26 mmol/L (ref 20–29)
Calcium: 9.7 mg/dL (ref 8.6–10.2)
Chloride: 98 mmol/L (ref 96–106)
Creatinine, Ser: 1 mg/dL (ref 0.76–1.27)
Glucose: 144 mg/dL — ABNORMAL HIGH (ref 70–99)
Potassium: 5.8 mmol/L (ref 3.5–5.2)
Sodium: 135 mmol/L (ref 134–144)
eGFR: 78 mL/min/{1.73_m2} (ref >59)

## 2023-01-08 NOTE — Telephone Encounter (Signed)
[facility] [employer] calling with a critical lab.

## 2023-01-08 NOTE — Telephone Encounter (Signed)
Huntley Dec from [employer] critical calling with critical lab for patient. Potassium is 5.8.  Spoke with DOD Dr. [place]. He stated he don't think we need to do anything tonight. He will consult with Dr. Allyson Sabal being that this a repeat lab.

## 2023-01-10 NOTE — Telephone Encounter (Signed)
Spoke with patient and he is aware to stop Ramipril and monitor BP and OP. He will come next week to repeat BMET. Order placed

## 2023-01-12 NOTE — Progress Notes (Signed)
Remote pacemaker transmission.   

## 2023-01-16 ENCOUNTER — Encounter: Payer: Self-pay | Admitting: Cardiovascular Disease

## 2023-01-16 DIAGNOSIS — I10 Essential (primary) hypertension: Secondary | ICD-10-CM

## 2023-01-16 DIAGNOSIS — Z79899 Other long term (current) drug therapy: Secondary | ICD-10-CM

## 2023-01-18 ENCOUNTER — Encounter (HOSPITAL_BASED_OUTPATIENT_CLINIC_OR_DEPARTMENT_OTHER): Payer: Self-pay

## 2023-01-18 ENCOUNTER — Ambulatory Visit (HOSPITAL_BASED_OUTPATIENT_CLINIC_OR_DEPARTMENT_OTHER)
Admission: RE | Admit: 2023-01-18 | Discharge: 2023-01-18 | Disposition: A | Discharge status: Home / Self Care | Payer: Medicare Other | Source: Ambulatory Visit | Attending: Cardiovascular Disease | Admitting: Cardiovascular Disease

## 2023-01-18 DIAGNOSIS — I6522 Occlusion and stenosis of left carotid artery: Secondary | ICD-10-CM | POA: Insufficient documentation

## 2023-01-18 MED ORDER — IOHEXOL 350 MG/ML SOLN
100.0000 mL | Freq: Once | INTRAVENOUS | Status: AC | PRN
  Administered 2023-01-18: 75 mL via INTRAVENOUS

## 2023-01-19 ENCOUNTER — Other Ambulatory Visit (HOSPITAL_BASED_OUTPATIENT_CLINIC_OR_DEPARTMENT_OTHER): Payer: Self-pay

## 2023-01-19 ENCOUNTER — Encounter: Payer: Self-pay | Admitting: Cardiovascular Disease

## 2023-01-19 DIAGNOSIS — I6522 Occlusion and stenosis of left carotid artery: Secondary | ICD-10-CM

## 2023-01-22 ENCOUNTER — Ambulatory Visit: Payer: Medicare Other | Attending: Cardiology | Admitting: Pharmacist Clinician (PhC)/ Clinical Pharmacy Specialist

## 2023-01-22 ENCOUNTER — Encounter: Payer: Self-pay | Admitting: Pharmacist Clinician (PhC)/ Clinical Pharmacy Specialist

## 2023-01-22 VITALS — BP 131/83 | HR 86

## 2023-01-22 DIAGNOSIS — I10 Essential (primary) hypertension: Secondary | ICD-10-CM

## 2023-01-22 MED ORDER — CHLORTHALIDONE 25 MG PO TABS
25.0000 mg | ORAL_TABLET | Freq: Every day | ORAL | 3 refills | Status: DC
Start: 2023-01-22 — End: 2023-12-13

## 2023-01-22 NOTE — Progress Notes (Signed)
Office Visit    Patient Name: Glenn Collins Date of Encounter: 01/22/2023  Primary Care Provider:  de [place], Buren Kos, MD Primary Cardiologist:  Nanetta Batty, MD  Chief Complaint    Hypertension  Significant Past Medical History   CAD 2007 stenting x 2 (RCA and LAD), 2017 RCA x 3 for in-stent restenosis  PAD Hx of claudication on  VTE After hip surgery 2023  Heart block Complete   HLD 8/24 LDL 81 on atorvastatin 80, ezetimibe    No Known Allergies  History of Present Illness    Glenn Collins is a 77 y.o. 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  Blood Pressure Goal:  130/80  Current Medications:metoprolol   Previously tried:  ramipril - hyperkalemia  Family Hx:  both parents had strokes in their 41's; no siblings or kids  Social Hx:      Tobacco: quit 20 years ago  Alcohol: wine daily - 4 glasses  Caffeine: none  Diet:   mostly home cooked meals; lots of f/v, usually fresh; plenty of protein; doesn't snack much - nuts, pretzels   Exercise: no  Home BP readings: home meter couple of years old, wrist model   12 readings in the past 2 weeks - average 143/80   Accessory Clinical Findings    Lab Results  Component Value Date   CREATININE 1.00 01/08/2023   BUN 15 01/08/2023   NA 135 01/08/2023   K 5.8 (HH) 01/08/2023   CL 98 01/08/2023   CO2 26 01/08/2023   Lab Results  Component Value Date   ALT 15 11/27/2022   AST 24 11/27/2022   ALKPHOS 84 11/27/2022   BILITOT 0.6 11/27/2022   Lab Results  Component Value Date   HGBA1C 5.5 05/09/2022    Home Medications    Current Outpatient Medications  Medication Sig Dispense Refill   aspirin EC 81 MG tablet Take 81 mg by mouth daily. Swallow whole.     atorvastatin (LIPITOR) 80 MG tablet TAKE ONE TABLET BY MOUTH DAILY AT 6PM. 90 tablet 3   chlorthalidone (HYGROTON) 25 MG tablet Take 1 tablet (25 mg total) by mouth daily. 90 tablet 3    diphenhydrAMINE (BENADRYL) 25 MG tablet Take 25 mg by mouth at bedtime as needed for sleep or allergies.     ezetimibe (ZETIA) 10 MG tablet Take 1 tablet (10 mg total) by mouth daily. 90 tablet 3   metoprolol succinate (TOPROL-XL) 25 MG 24 hr tablet Take 1 tablet (25 mg total) by mouth daily. 30 tablet 11   Multiple Vitamin (MULTIVITAMIN WITH MINERALS) TABS tablet Take 1 tablet by mouth daily.     nitroGLYCERIN (NITROSTAT) 0.4 MG SL tablet Place 1 tablet (0.4 mg total) under the tongue every 5 (five) minutes x 3 doses as needed for chest pain. 25 tablet 1   sodium chloride (OCEAN) 0.65 % SOLN nasal spray Place 1 spray into both nostrils as needed for congestion.     No current facility-administered medications for this visit.        Assessment & Plan    Hypertension Assessment: BP is uncontrolled in office BP 131/83 mmHg;  above the goal (<130/80). Home readings average 143/80 with systolic as high as 157, diastolic 90 Had to discontinue ramipril 2/2 hyperkalemia Denies SOB, palpitation, chest pain, headaches,or swelling Reiterated the importance of regular exercise and low salt diet   Plan:  Start taking chlorthalidone 25 mg once daily in the mornings Continue taking metoprolol  Patient to keep record of BP readings with heart rate and report to [place] at the next visit Patient to follow up with PharmD in 6 weeks  Labs ordered today:  BMET 10 days   Phillips Hay PharmD CPP [facility]  [address] [place], [place] [address] [phone]

## 2023-01-22 NOTE — Patient Instructions (Signed)
Follow up appointment: Tuesday November 19 at 10 am  Go to the lab in 10 days to check electrolytes  Take your BP meds as follows:  Start chlorthalidone 25 mg once daily in the mornings  Continue with all other medications  Check your blood pressure at home daily (if able) and keep record of the readings.  Hypertension "High blood pressure"  Hypertension is often called "The Silent Killer." It rarely causes symptoms until it is extremely  high or has done damage to other organs in the body. For this reason, you should have your  blood pressure checked regularly by your physician. We will check your blood pressure  every time you see a provider at one of our offices.   Your blood pressure reading consists of two numbers. Ideally, blood pressure should be  below 120/80. The first ("top") number is called the systolic pressure. It measures the  pressure in your arteries as your heart beats. The second ("bottom") number is called the diastolic pressure. It measures the pressure in your arteries as the heart relaxes between beats.  The benefits of getting your blood pressure under control are enormous. A 10-point  reduction in systolic blood pressure can reduce your risk of stroke by 27% and heart failure by 28%  Your blood pressure goal is < 130/80  To check your pressure at home you will need to:  1. Sit up in a chair, with feet flat on the floor and back supported. Do not cross your ankles or legs. 2. Rest your left arm so that the cuff is about heart level. If the cuff goes on your upper arm,  then just relax the arm on the table, arm of the chair or your lap. If you have a wrist cuff, we  suggest relaxing your wrist against your chest (think of it as Pledging the Flag with the  wrong arm).  3. Place the cuff snugly around your arm, about 1 inch above the crook of your elbow. The  cords should be inside the groove of your elbow.  4. Sit quietly, with the cuff in place, for about  5 minutes. After that 5 minutes press the power  button to start a reading. 5. Do not talk or move while the reading is taking place.  6. Record your readings on a sheet of paper. Although most cuffs have a memory, it is often  easier to see a pattern developing when the numbers are all in front of you.  7. You can repeat the reading after 1-3 minutes if it is recommended  Make sure your bladder is empty and you have not had caffeine or tobacco within the last 30 min  Always bring your blood pressure log with you to your appointments. If you have not brought your monitor in to be double checked for accuracy, please bring it to your next appointment.  You can find a list of quality blood pressure cuffs at validatebp.org

## 2023-01-22 NOTE — Assessment & Plan Note (Signed)
Assessment: BP is uncontrolled in office BP 131/83 mmHg;  above the goal (<130/80). Home readings average 143/80 with systolic as high as 157, diastolic 90 Had to discontinue ramipril 2/2 hyperkalemia Denies SOB, palpitation, chest pain, headaches,or swelling Reiterated the importance of regular exercise and low salt diet   Plan:  Start taking chlorthalidone 25 mg once daily in the mornings Continue taking metoprolol  Patient to keep record of BP readings with heart rate and report to [place] at the next visit Patient to follow up with PharmD in 6 weeks  Labs ordered today:  BMET 10 days

## 2023-01-23 ENCOUNTER — Other Ambulatory Visit: Payer: Self-pay | Admitting: Cardiovascular Disease

## 2023-01-29 ENCOUNTER — Ambulatory Visit (HOSPITAL_BASED_OUTPATIENT_CLINIC_OR_DEPARTMENT_OTHER): Payer: Medicare Other | Admitting: Family Medicine

## 2023-01-31 ENCOUNTER — Ambulatory Visit (HOSPITAL_BASED_OUTPATIENT_CLINIC_OR_DEPARTMENT_OTHER): Payer: Medicare Other | Admitting: Family Medicine

## 2023-01-31 VITALS — BP 132/90 | HR 96 | Ht 71.0 in | Wt 206.7 lb

## 2023-01-31 DIAGNOSIS — E782 Mixed hyperlipidemia: Secondary | ICD-10-CM

## 2023-01-31 DIAGNOSIS — I10 Essential (primary) hypertension: Secondary | ICD-10-CM | POA: Diagnosis not present

## 2023-01-31 NOTE — Assessment & Plan Note (Addendum)
Blood pressure borderline on initial reading, stable on recheck.  Patient was noted to have elevated potassium on prior labs through cardiology and as a result patient was switched from ACE inhibitor to chlorthalidone.  This was a recent change and patient indicates that he has been doing well with new medication.  He will be having repeat labs and follow-up appointment through cardiology.   No changes to be made to medications today.  Recommend intermittent monitoring of blood pressure at home, DASH diet

## 2023-01-31 NOTE — Assessment & Plan Note (Signed)
Continues with atorvastatin, denies any issues with medication.  He continues to follow with cardiology related to medication management.  Has had improved cholesterol numbers with medications, however reports that cardiology is targeting a lower numbers on labs and he may need further medication changes.  No concerns otherwise today.

## 2023-01-31 NOTE — Progress Notes (Signed)
    Procedures performed today:    None.  Independent interpretation of notes and tests performed by another provider:   None.  Brief History, Exam, Impression, and Recommendations:    BP (!) 132/90 (BP Location: Right Arm, Patient Position: Sitting, Cuff Size: Normal)   Pulse 96   Ht 5\' 11"  (1.803 m)   Wt 206 lb 11.2 oz (93.8 kg)   SpO2 99%   BMI 28.83 kg/m   Primary hypertension Assessment & Plan: Blood pressure borderline on initial reading, stable on recheck.  Patient was noted to have elevated potassium on prior labs through cardiology and as a result patient was switched from ACE inhibitor to chlorthalidone.  This was a recent change and patient indicates that he has been doing well with new medication.  He will be having repeat labs and follow-up appointment through cardiology.   No changes to be made to medications today.  Recommend intermittent monitoring of blood pressure at home, DASH diet   Mixed hyperlipidemia Assessment & Plan: Continues with atorvastatin, denies any issues with medication.  He continues to follow with cardiology related to medication management.  Has had improved cholesterol numbers with medications, however reports that cardiology is targeting a lower numbers on labs and he may need further medication changes.  No concerns otherwise today.   Patient does have upcoming evaluation with vascular surgeon due to high-grade stenosis of left proximal internal carotid artery.  Return in about 6 months (around 08/01/2023) for hypertension, hyperlipidemia.   ___________________________________________ Maressa Apollo de [place], MD, ABFM, [facility]

## 2023-02-01 ENCOUNTER — Other Ambulatory Visit: Payer: Self-pay

## 2023-02-01 DIAGNOSIS — E875 Hyperkalemia: Secondary | ICD-10-CM

## 2023-02-02 LAB — BASIC METABOLIC PANEL
BUN/Creatinine Ratio: 18 (ref 10–24)
BUN: 19 mg/dL (ref 8–27)
CO2: 27 mmol/L (ref 20–29)
Calcium: 9.5 mg/dL (ref 8.6–10.2)
Chloride: 88 mmol/L — ABNORMAL LOW (ref 96–106)
Creatinine, Ser: 1.04 mg/dL (ref 0.76–1.27)
Glucose: 176 mg/dL — ABNORMAL HIGH (ref 70–99)
Potassium: 4.6 mmol/L (ref 3.5–5.2)
Sodium: 133 mmol/L — ABNORMAL LOW (ref 134–144)
eGFR: 74 mL/min/{1.73_m2} (ref >59)

## 2023-02-08 ENCOUNTER — Encounter: Payer: Self-pay | Admitting: Vascular Surgery

## 2023-02-08 ENCOUNTER — Ambulatory Visit: Payer: Medicare Other | Admitting: Vascular Surgery

## 2023-02-08 VITALS — BP 145/83 | HR 94 | Temp 98.0°F | Ht 71.0 in | Wt 205.9 lb

## 2023-02-08 DIAGNOSIS — I6522 Occlusion and stenosis of left carotid artery: Secondary | ICD-10-CM | POA: Diagnosis not present

## 2023-02-08 NOTE — Progress Notes (Signed)
Patient ID: Glenn Collins, male   DOB: 04/29/1945, 77 y.o.   MRN: [id number]  Reason for Consult: Carotid   Referred by de [place], Raymond J, MD  Subjective:     HPI:  Glenn Collins is a 77 y.o. male originally from [place] and I have seen in the past for pain in his bilateral lower extremities right greater than left.  Currently states that his legs are okay.  He is followed by Dr. Allyson Sabal with cardiology who has identified high-grade stenosis of the left ICA by both duplex and CTA.  Patient denies any history of stroke, TIA or amaurosis.  He is here today to discuss the results of his recent testing.  Patient is currently taking aspirin and a statin.  Past Medical History:  Diagnosis Date   Arthritis    CAD (coronary artery disease) 10/27/2018   Carotid artery occlusion    Coronary artery disease    Heart murmur Since childhood   Hypertension    PAD (peripheral artery disease) (HCC) 10/27/2018   Presence of permanent cardiac pacemaker    Biotronik PPM   Family History  Problem Relation Age of Onset   Cancer Mother    Hearing loss Mother    CVA Father    Past Surgical History:  Procedure Laterality Date   CARDIAC SURGERY  2019   with stents x2   HERNIA REPAIR  2012   PACEMAKER IMPLANT N/A 09/29/2019   Procedure: PACEMAKER IMPLANT;  Surgeon: Marinus Maw, MD;  Location: [facility] INVASIVE [facility];  Service: Cardiovascular;  Laterality: N/A;   TOTAL HIP ARTHROPLASTY Right 06/15/2021   Procedure: TOTAL HIP ARTHROPLASTY ANTERIOR APPROACH;  Surgeon: Samson Frederic, MD;  Location: [facility] ORS;  Service: Orthopedics;  Laterality: Right;    Short Social History:  Social History   Tobacco Use   Smoking status: Former    Current packs/day: 0.00    Average packs/day: 1.5 packs/day for 40.0 years (60.0 ttl pk-yrs)    Types: Cigarettes    Start date: 10/15/1960    Quit date: 10/15/2000    Years since quitting: 22.3   Smokeless tobacco: Never  Substance Use Topics   Alcohol use:  Yes    Alcohol/week: 28.0 standard drinks of alcohol    Types: 28 Glasses of wine per week    Comment: 4 glasses per day    No Known Allergies  Current Outpatient Medications  Medication Sig Dispense Refill   aspirin EC 81 MG tablet Take 81 mg by mouth daily. Swallow whole.     atorvastatin (LIPITOR) 80 MG tablet TAKE ONE TABLET BY MOUTH DAILY AT 6PM. 90 tablet 3   chlorthalidone (HYGROTON) 25 MG tablet Take 1 tablet (25 mg total) by mouth daily. 90 tablet 3   diphenhydrAMINE (BENADRYL) 25 MG tablet Take 25 mg by mouth at bedtime as needed for sleep or allergies.     ezetimibe (ZETIA) 10 MG tablet Take 1 tablet (10 mg total) by mouth daily. 90 tablet 3   metoprolol succinate (TOPROL-XL) 25 MG 24 hr tablet Take 1 tablet (25 mg total) by mouth daily. 30 tablet 11   Multiple Vitamin (MULTIVITAMIN WITH MINERALS) TABS tablet Take 1 tablet by mouth daily.     nitroGLYCERIN (NITROSTAT) 0.4 MG SL tablet Place 1 tablet (0.4 mg total) under the tongue every 5 (five) minutes x 3 doses as needed for chest pain. 25 tablet 1   sodium chloride (OCEAN) 0.65 % SOLN nasal spray Place 1 spray into both  nostrils as needed for congestion.     No current facility-administered medications for this visit.    Review of Systems  Constitutional:  Constitutional negative. HENT: HENT negative.  Eyes: Eyes negative.  Respiratory: Respiratory negative.  Cardiovascular: Positive for claudication.  GI: Gastrointestinal negative.  Musculoskeletal: Musculoskeletal negative.  Skin: Skin negative.  Neurological: Neurological negative. Hematologic: Hematologic/lymphatic negative.  Psychiatric: Psychiatric negative.        Objective:  Objective   Vitals:   02/08/23 1013 02/08/23 1014  BP: 130/85 (!) 145/83  Pulse: 94   Temp: 98 F (36.7 C)   TempSrc: Temporal   SpO2: 98%   Weight: 205 lb 14.4 oz (93.4 kg)   Height: 5\' 11"  (1.803 m)    Body mass index is 28.72 kg/m.  Physical Exam HENT:     Head:  Normocephalic.     Nose: Nose normal.  Eyes:     Pupils: Pupils are equal, round, and reactive to light.  Neck:     Vascular: No carotid bruit.  Cardiovascular:     Rate and Rhythm: Normal rate.     Heart sounds: No murmur heard. Pulmonary:     Effort: Pulmonary effort is normal.  Musculoskeletal:        General: Normal range of motion.     Right lower leg: No edema.     Left lower leg: No edema.  Skin:    General: Skin is warm.     Capillary Refill: Capillary refill takes less than 2 seconds.  Neurological:     General: No focal deficit present.     Mental Status: He is alert.  Psychiatric:        Mood and Affect: Mood normal.     Data: Right Carotid Findings:  +----------+--------+--------+--------+------------------+--------+           PSV cm/sEDV cm/sStenosisPlaque DescriptionComments  +----------+--------+--------+--------+------------------+--------+  CCA Prox  57      6                                           +----------+--------+--------+--------+------------------+--------+  CCA Distal81      15      <50%    heterogenous                +----------+--------+--------+--------+------------------+--------+  ICA Prox  90      16              heterogenous                +----------+--------+--------+--------+------------------+--------+  ICA Mid   103     31      1-39%   heterogenous                +----------+--------+--------+--------+------------------+--------+  ICA Distal98      21                                          +----------+--------+--------+--------+------------------+--------+  ECA      83      8                                           +----------+--------+--------+--------+------------------+--------+   +----------+--------+-------+----------------------+-------------------+  PSV cm/sEDV cmsDescribe              Arm Pressure (mmHG)   +----------+--------+-------+----------------------+-------------------+  [id number]           Stenotic and Turbulent132                  +----------+--------+-------+----------------------+-------------------+   +---------+--------+--+--------+-+---------------------------+  VertebralPSV cm/s29EDV cm/s7Antegrade and Small caliber  +---------+--------+--+--------+-+---------------------------+      Left Carotid Findings:  +----------+--------+--------+--------+------------------+---------+           PSV cm/sEDV cm/sStenosisPlaque DescriptionComments   +----------+--------+--------+--------+------------------+---------+  CCA Prox  95      12                                           +----------+--------+--------+--------+------------------+---------+  CCA Distal63      14      <50%    heterogenous                 +----------+--------+--------+--------+------------------+---------+  ICA Prox  354     118     80-99%  calcific          Shadowing  +----------+--------+--------+--------+------------------+---------+  ICA Mid   63      17                                           +----------+--------+--------+--------+------------------+---------+  ICA Distal58      18                                           +----------+--------+--------+--------+------------------+---------+  ECA      219     15      >50%    heterogenous                 +----------+--------+--------+--------+------------------+---------+   +----------+--------+--------+---------+-------------------+           PSV cm/sEDV cm/sDescribe Arm Pressure (mmHG)  +----------+--------+--------+---------+-------------------+  [id number]            Turbulent126                  +----------+--------+--------+---------+-------------------+   +---------+--------+--+--------+--+---------+  VertebralPSV cm/s59EDV cm/s18Antegrade   +---------+--------+--+--------+--+---------+           Findings reported to Dr. Allyson Sabal via Epic messaging at 10:39 am.   Summary:  Right Carotid: Velocities in the right ICA are consistent with a 1-39%  stenosis.                Non-hemodynamically significant plaque <50% noted in the  CCA.   Left Carotid: Velocities in the left ICA are consistent with a 80-99%  stenosis.               Non-hemodynamically significant plaque <50% noted in the  CCA. The                ECA appears >50% stenosed.   Vertebrals:  Bilateral vertebral arteries demonstrate antegrade flow.  Small              caliber right vertebral artery.  Subclavians: Right subclavian artery was stenotic. Bilateral subclavian  artery              flow was  disturbed.   CTA IMPRESSION: 1. Bulky calcified plaque at the left carotid bifurcation resulting in high-grade stenosis of the proximal internal carotid artery. 2. Calcified plaque at the right carotid bifurcation resulting in approximately 40-50% stenosis of the proximal internal carotid artery. 3. Severe right and mild left vertebral artery origin stenosis. The left vertebral artery is dominant.     Assessment/Plan:     77 year old male with high-grade left ICA stenosis confirmed with CT angio.  We reviewed his images together at bedside today which demonstrates a bulky calcified plaque right at the ICA bifurcation left.  This has known elevated velocities by duplex.  We discussed his options being medical therapy with the addition of Plavix to aspirin and statin versus stenting versus carotid endarterectomy.  Given the calcific nature I have recommended against any type of stenting either transfemoral or transcarotid.  Patient is going to discuss with his wife but likely will proceed with left carotid endarterectomy in the near future.  He will need cardiac clearance from Dr. Allyson Sabal prior to intervention.  If patient elects against surgery would add Plavix for  dual antiplatelet coverage.     Maeola Harman MD [facility]

## 2023-02-14 ENCOUNTER — Encounter: Payer: Self-pay | Admitting: Vascular Surgery

## 2023-02-14 ENCOUNTER — Ambulatory Visit: Payer: Medicare Other | Admitting: Vascular Surgery

## 2023-02-22 ENCOUNTER — Telehealth: Payer: Self-pay

## 2023-02-22 ENCOUNTER — Other Ambulatory Visit: Payer: Self-pay

## 2023-02-22 DIAGNOSIS — I6522 Occlusion and stenosis of left carotid artery: Secondary | ICD-10-CM

## 2023-02-22 NOTE — Telephone Encounter (Signed)
Contacted patient to schedule for left carotid endarterectomy. Offered multiple dates, as early as 11/19 but patient declined. He agreed to schedule surgery on 12/10. Instructions provided and he voiced understanding. Will also send instructions letter via MyChart.

## 2023-02-22 NOTE — Telephone Encounter (Signed)
...     Pre-operative Risk Assessment    Patient Name: Glenn Collins  DOB: [birth year] MRN: [id number]      Request for Surgical Clearance    Procedure:   LEFT CAROTID ENDARTERECTOMY  Date of Surgery:  Clearance 03/06/23                                 Surgeon:  DR Lemar Livings Surgeon's Group or Practice Name:  [facility] Phone number:  [phone] Fax number:  [phone]   Type of Clearance Requested:   - Medical  - Pharmacy:  Hold Aspirin     Type of Anesthesia:  General    Additional requests/questions:   LAST O/V 01/02/23, NEXT APPT 07/02/23  Signed, Renee Ramus   02/22/2023, 10:02 AM

## 2023-02-22 NOTE — Telephone Encounter (Signed)
Good Morning Dr. Allyson Sabal  We have received a surgical clearance request for Glenn Collins for left carotid enterectomy. They were seen recently in clinic on 01/02/2023.  He has a past medical history of CHB s/p PPM, HTN, PAD, aortic stenosis, subclavian stenosis.  Can you please comment on surgical clearance and guidance on holding aspirin. Please forward you guidance and recommendations to P CV DIV PREOP   Thanks, Robin Searing, NP

## 2023-02-26 ENCOUNTER — Other Ambulatory Visit: Payer: Self-pay | Admitting: Cardiovascular Disease

## 2023-02-26 NOTE — Telephone Encounter (Signed)
   Patient Name: Glenn Collins  DOB: 1945-12-21 MRN: [id number]  Primary Cardiologist: Nanetta Batty, MD  Chart reviewed as part of pre-operative protocol coverage. Given past medical history and time since last visit, based on ACC/AHA guidelines, [name] is at acceptable risk for the planned procedure without further cardiovascular testing. Surgery scheduled for 03/06/2023.  Per Dr. Allyson Sabal "Cleared for LCEA. Can hold anti platelet agents if necessary "  Per office protocol, if patient is without any new symptoms or concerns at the time of their virtual visit, he/she may hold ASA for 7 days prior to procedure. Please resume 7 as soon as possible postprocedure, at the discretion of the surgeon.    The patient was advised that if he develops new symptoms prior to surgery to contact our office to arrange for a follow-up visit, and he verbalized understanding.  I will route this recommendation to the requesting party via Epic fax function and remove from pre-op pool.  Please call with questions.  Joni Reining, NP 02/26/2023, 7:25 AM

## 2023-02-28 ENCOUNTER — Other Ambulatory Visit: Payer: Self-pay | Admitting: Cardiovascular Disease

## 2023-03-06 ENCOUNTER — Ambulatory Visit: Payer: Medicare Other | Attending: Cardiology | Admitting: Pharmacist

## 2023-03-06 ENCOUNTER — Encounter: Payer: Self-pay | Admitting: Pharmacist

## 2023-03-06 VITALS — BP 130/74 | HR 91

## 2023-03-06 DIAGNOSIS — I214 Non-ST elevation (NSTEMI) myocardial infarction: Secondary | ICD-10-CM | POA: Diagnosis not present

## 2023-03-06 DIAGNOSIS — I10 Essential (primary) hypertension: Secondary | ICD-10-CM

## 2023-03-06 NOTE — Patient Instructions (Addendum)
It was nice meeting you today  We would like your blood pressure to stay less than 130/80 and today it looks great  Continue your chlorthalidone 25mg  once a day and metoprolol 25mg  once a day  Continue to monitor your blood pressure at home and let us know if it becomes elevated again  Please let us know if you have any questions  Laural Golden, PharmD, BCACP, CDCES, CPP [address] [place], [place], [address] Phone: [phone], Fax: [phone]

## 2023-03-06 NOTE — Progress Notes (Signed)
Patient ID: Glenn Collins                 DOB: [birth year]                      MRN: [id number]     HPI: Glenn Collins is a 77 y.o. male referred by Dr. Allyson Sabal to HTN clinic. PMH is significant for HLD, PAD, CAD, NSTEMI, and HTN. Chlorthalidone added at last visit.  Patient presents today in good spirits. Ramipril previously discontinued due to hyperkalemia. K normalized once medication was held. Chlorthalidone started at last visit and patient is tolerating well. No patient reported adverse effects.  Home readings: 11/5: 130/65 11/6: 123/69 11/11: 141/85 11/12: 132/81 11/13: 122/73  Does not have a formal exercise plan other than walking up and down stairs of his house. Scheduled for endarterectomy next month.  Former smoker. Does drink alcohol.  Current HTN meds:  Chlorthalidone 25mg  daily Metoprolol succinate 25mg   Previously tried: Ramipril  BP goal: <130/80  Wt Readings from Last 3 Encounters:  02/08/23 205 lb 14.4 oz (93.4 kg)  01/31/23 206 lb 11.2 oz (93.8 kg)  01/02/23 207 lb (93.9 kg)   BP Readings from Last 3 Encounters:  02/08/23 (!) 145/83  01/31/23 (!) 132/90  01/22/23 131/83   Pulse Readings from Last 3 Encounters:  02/08/23 94  01/31/23 96  01/22/23 86    Renal function: CrCl cannot be calculated (Patient's most recent lab result is older than the maximum 21 days allowed.).  Past Medical History:  Diagnosis Date   Arthritis    CAD (coronary artery disease) 10/27/2018   Carotid artery occlusion    Coronary artery disease    Heart murmur Since childhood   Hypertension    PAD (peripheral artery disease) (HCC) 10/27/2018   Presence of permanent cardiac pacemaker    Biotronik PPM    Current Outpatient Medications on File Prior to Visit  Medication Sig Dispense Refill   aspirin EC 81 MG tablet Take 81 mg by mouth daily. Swallow whole.     atorvastatin (LIPITOR) 80 MG tablet TAKE ONE TABLET BY MOUTH DAILY AT 6PM. 90 tablet 3   chlorthalidone  (HYGROTON) 25 MG tablet Take 1 tablet (25 mg total) by mouth daily. 90 tablet 3   diphenhydrAMINE (BENADRYL) 25 MG tablet Take 25 mg by mouth at bedtime as needed for sleep or allergies.     ezetimibe (ZETIA) 10 MG tablet Take 1 tablet (10 mg total) by mouth daily. 90 tablet 3   metoprolol succinate (TOPROL-XL) 25 MG 24 hr tablet Take 1 tablet (25 mg total) by mouth daily. 30 tablet 11   Multiple Vitamin (MULTIVITAMIN WITH MINERALS) TABS tablet Take 1 tablet by mouth daily.     nitroGLYCERIN (NITROSTAT) 0.4 MG SL tablet Place 1 tablet (0.4 mg total) under the tongue every 5 (five) minutes x 3 doses as needed for chest pain. 25 tablet 1   sodium chloride (OCEAN) 0.65 % SOLN nasal spray Place 1 spray into both nostrils as needed for congestion.     No current facility-administered medications on file prior to visit.    No Known Allergies   Assessment/Plan:  1. Hypertension -  Patient BP in room today 130/74 which is at goal of <130/80. Home readings well controlled as well. Recommend patient continue chlorthalidone and metoprolol and continue to monitor readings at home. Recheck as needed.  Continue chlorthalidone 25mg  daily Continue metoprolol succinate 25mg  daily Recheck as needed  Thayer Ohm  Kynadee Dam, PharmD, BCACP, CDCES, CPP [address] [place], [place], [address] Phone: [phone], Fax: [phone]

## 2023-03-19 ENCOUNTER — Encounter: Payer: Self-pay | Admitting: *Deleted

## 2023-03-19 ENCOUNTER — Telehealth: Payer: Self-pay | Admitting: *Deleted

## 2023-03-19 NOTE — Telephone Encounter (Signed)
Pt would like remote on 03/27/23 rescheduled.

## 2023-03-19 NOTE — Telephone Encounter (Signed)
Patient called into office and would like to reschedule Pacemaker check. Advised patient that this would be forward to pacemaker  device clinic.

## 2023-03-20 NOTE — Telephone Encounter (Signed)
Done

## 2023-03-22 NOTE — Progress Notes (Signed)
Surgical Instructions   Your procedure is scheduled on Tuesday, March 27, 2023. Report to [facility] Main Entrance "A" at 5:30 A.M., then check in with the Admitting office. Any questions or running late day of surgery: call [phone]  Questions prior to your surgery date: call [phone], Monday-Friday, 8am-4pm. If you experience any cold or flu symptoms such as cough, fever, chills, shortness of breath, etc. between now and your scheduled surgery, please notify us at the above number.     Remember:  Do not eat or drink after midnight the night before your surgery   Take these medicines the morning of surgery with A SIP OF WATER   atorvastatin (LIPITOR)  chlorthalidone (HYGROTON)  ezetimibe (ZETIA)  metoprolol succinate (TOPROL-XL)    May take these medicines IF NEEDED:  nitroGLYCERIN (NITROSTAT)  sodium chloride (OCEAN)    One week prior to surgery, STOP taking any Aleve, Naproxen, Ibuprofen, Motrin, Advil, Goody's, BC's, all herbal medications, fish oil, and non-prescription vitamins.  Follow your surgeon's instructions on when to stop Asprin.  If no instructions were given by your surgeon then you will need to call the office to get those instructions.                       Do NOT Smoke (Tobacco/Vaping) for 24 hours prior to your procedure.  If you use a CPAP at night, you may bring your mask/headgear for your overnight stay.   You will be asked to remove any contacts, glasses, piercing's, hearing aid's, dentures/partials prior to surgery. Please bring cases for these items if needed.    Patients discharged the day of surgery will not be allowed to drive home, and someone needs to stay with them for 24 hours.  SURGICAL WAITING ROOM VISITATION Patients may have no more than 2 support people in the waiting area - these visitors may rotate.   Pre-op nurse will coordinate an appropriate time for 1 ADULT support person, who may not rotate, to accompany patient in  pre-op.  Children under the age of 26 must have an adult with them who is not the patient and must remain in the main waiting area with an adult.  If the patient needs to stay at the hospital during part of their recovery, the visitor guidelines for inpatient rooms apply.  Please refer to the [facility] website for the visitor guidelines for any additional information.   If you received a COVID test during your pre-op visit  it is requested that you wear a mask when out in public, stay away from anyone that may not be feeling well and notify your surgeon if you develop symptoms. If you have been in contact with anyone that has tested positive in the last 10 days please notify you surgeon.      Pre-operative 5 CHG Bathing Instructions   You can play a key role in reducing the risk of infection after surgery. Your skin needs to be as free of germs as possible. You can reduce the number of germs on your skin by washing with CHG (chlorhexidine gluconate) soap before surgery. CHG is an antiseptic soap that kills germs and continues to kill germs even after washing.   DO NOT use if you have an allergy to chlorhexidine/CHG or antibacterial soaps. If your skin becomes reddened or irritated, stop using the CHG and notify one of our RNs at [phone].   Please shower with the CHG soap starting 4 days before surgery using  the following schedule:     Please keep in mind the following:  DO NOT shave, including legs and underarms, starting the day of your first shower.   You may shave your face at any point before/day of surgery.  Place clean sheets on your bed the day you start using CHG soap. Use a clean washcloth (not used since being washed) for each shower. DO NOT sleep with pets once you start using the CHG.   CHG Shower Instructions:  Wash your face and private area with normal soap. If you choose to wash your hair, wash first with your normal shampoo.  After you use shampoo/soap, rinse  your hair and body thoroughly to remove shampoo/soap residue.  Turn the water OFF and apply about 3 tablespoons (45 ml) of CHG soap to a CLEAN washcloth.  Apply CHG soap ONLY FROM YOUR NECK DOWN TO YOUR TOES (washing for 3-5 minutes)  DO NOT use CHG soap on face, private areas, open wounds, or sores.  Pay special attention to the area where your surgery is being performed.  If you are having back surgery, having someone wash your back for you may be helpful. Wait 2 minutes after CHG soap is applied, then you may rinse off the CHG soap.  Pat dry with a clean towel  Put on clean clothes/pajamas   If you choose to wear lotion, please use ONLY the CHG-compatible lotions on the back of this paper.   Additional instructions for the day of surgery: DO NOT APPLY any lotions, deodorants, cologne, or perfumes.   Do not bring valuables to the hospital. [facility] is not responsible for any belongings/valuables. Do not wear nail polish, gel polish, artificial nails, or any other type of covering on natural nails (fingers and toes) Do not wear jewelry or makeup Put on clean/comfortable clothes.  Please brush your teeth.  Ask your nurse before applying any prescription medications to the skin.     CHG Compatible Lotions   Aveeno Moisturizing lotion  Cetaphil Moisturizing Cream  Cetaphil Moisturizing Lotion  Clairol Herbal Essence Moisturizing Lotion, Dry Skin  Clairol Herbal Essence Moisturizing Lotion, Extra Dry Skin  Clairol Herbal Essence Moisturizing Lotion, Normal Skin  Curel Age Defying Therapeutic Moisturizing Lotion with Alpha Hydroxy  Curel Extreme Care Body Lotion  Curel Soothing Hands Moisturizing Hand Lotion  Curel Therapeutic Moisturizing Cream, Fragrance-Free  Curel Therapeutic Moisturizing Lotion, Fragrance-Free  Curel Therapeutic Moisturizing Lotion, Original Formula  Eucerin Daily Replenishing Lotion  Eucerin Dry Skin Therapy Plus Alpha Hydroxy Crme  Eucerin Dry Skin  Therapy Plus Alpha Hydroxy Lotion  Eucerin Original Crme  Eucerin Original Lotion  Eucerin Plus Crme Eucerin Plus Lotion  Eucerin TriLipid Replenishing Lotion  Keri Anti-Bacterial Hand Lotion  Keri Deep Conditioning Original Lotion Dry Skin Formula Softly Scented  Keri Deep Conditioning Original Lotion, Fragrance Free Sensitive Skin Formula  Keri Lotion Fast Absorbing Fragrance Free Sensitive Skin Formula  Keri Lotion Fast Absorbing Softly Scented Dry Skin Formula  Keri Original Lotion  Keri Skin Renewal Lotion Keri Silky Smooth Lotion  Keri Silky Smooth Sensitive Skin Lotion  Nivea Body Creamy Conditioning Oil  Nivea Body Extra Enriched Lotion  Nivea Body Original Lotion  Nivea Body Sheer Moisturizing Lotion Nivea Crme  Nivea Skin Firming Lotion  NutraDerm 30 Skin Lotion  NutraDerm Skin Lotion  NutraDerm Therapeutic Skin Cream  NutraDerm Therapeutic Skin Lotion  ProShield Protective Hand Cream  Provon moisturizing lotion  Please read over the following fact sheets that you were given.

## 2023-03-23 ENCOUNTER — Other Ambulatory Visit: Payer: Self-pay

## 2023-03-23 ENCOUNTER — Encounter (HOSPITAL_COMMUNITY): Payer: Self-pay

## 2023-03-23 ENCOUNTER — Encounter (HOSPITAL_COMMUNITY)
Admission: RE | Admit: 2023-03-23 | Discharge: 2023-03-23 | Disposition: A | Discharge status: Home / Self Care | Payer: Medicare Other | Source: Ambulatory Visit | Attending: Vascular Surgery | Admitting: Vascular Surgery

## 2023-03-23 VITALS — BP 145/74 | HR 101 | Temp 98.2°F | Resp 17 | Ht 71.0 in | Wt 204.5 lb

## 2023-03-23 DIAGNOSIS — Z01818 Encounter for other preprocedural examination: Secondary | ICD-10-CM

## 2023-03-23 DIAGNOSIS — I6522 Occlusion and stenosis of left carotid artery: Secondary | ICD-10-CM | POA: Insufficient documentation

## 2023-03-23 DIAGNOSIS — Z01812 Encounter for preprocedural laboratory examination: Secondary | ICD-10-CM | POA: Insufficient documentation

## 2023-03-23 DIAGNOSIS — I10 Essential (primary) hypertension: Secondary | ICD-10-CM | POA: Insufficient documentation

## 2023-03-23 DIAGNOSIS — I739 Peripheral vascular disease, unspecified: Secondary | ICD-10-CM | POA: Insufficient documentation

## 2023-03-23 DIAGNOSIS — Z9689 Presence of other specified functional implants: Secondary | ICD-10-CM | POA: Diagnosis not present

## 2023-03-23 DIAGNOSIS — I350 Nonrheumatic aortic (valve) stenosis: Secondary | ICD-10-CM | POA: Insufficient documentation

## 2023-03-23 DIAGNOSIS — I442 Atrioventricular block, complete: Secondary | ICD-10-CM | POA: Insufficient documentation

## 2023-03-23 LAB — SURGICAL PCR SCREEN
MRSA, PCR: NEGATIVE
Staphylococcus aureus: NEGATIVE

## 2023-03-23 LAB — URINALYSIS, ROUTINE W REFLEX MICROSCOPIC
Bilirubin Urine: NEGATIVE
Glucose, UA: NEGATIVE mg/dL
Hgb urine dipstick: NEGATIVE
Ketones, ur: NEGATIVE mg/dL
Leukocytes,Ua: NEGATIVE
Nitrite: NEGATIVE
Protein, ur: NEGATIVE mg/dL
Specific Gravity, Urine: 1.009 (ref 1.005–1.030)
pH: 7 (ref 5.0–8.0)

## 2023-03-23 LAB — COMPREHENSIVE METABOLIC PANEL
ALT: 22 U/L (ref 0–44)
AST: 31 U/L (ref 15–41)
Albumin: 4.1 g/dL (ref 3.5–5.0)
Alkaline Phosphatase: 98 U/L (ref 38–126)
Anion gap: 12 (ref 5–15)
BUN: 15 mg/dL (ref 8–23)
CO2: 29 mmol/L (ref 22–32)
Calcium: 9.7 mg/dL (ref 8.9–10.3)
Chloride: 91 mmol/L — ABNORMAL LOW (ref 98–111)
Creatinine, Ser: 1.15 mg/dL (ref 0.61–1.24)
GFR, Estimated: >60 mL/min (ref >60)
Glucose, Bld: 136 mg/dL — ABNORMAL HIGH (ref 70–99)
Potassium: 3.1 mmol/L — ABNORMAL LOW (ref 3.5–5.1)
Sodium: 132 mmol/L — ABNORMAL LOW (ref 135–145)
Total Bilirubin: 1 mg/dL (ref <1.2)
Total Protein: 7.2 g/dL (ref 6.5–8.1)

## 2023-03-23 LAB — CBC
HCT: 36.5 % — ABNORMAL LOW (ref 39.0–52.0)
Hemoglobin: 12.8 g/dL — ABNORMAL LOW (ref 13.0–17.0)
MCH: 31.8 pg (ref 26.0–34.0)
MCHC: 35.1 g/dL (ref 30.0–36.0)
MCV: 90.8 fL (ref 80.0–100.0)
Platelets: 243 10*3/uL (ref 150–400)
RBC: 4.02 MIL/uL — ABNORMAL LOW (ref 4.22–5.81)
RDW: 13.2 % (ref 11.5–15.5)
WBC: 7.9 10*3/uL (ref 4.0–10.5)
nRBC: 0 % (ref 0.0–0.2)

## 2023-03-23 LAB — TYPE AND SCREEN
ABO/RH(D): A POS
Antibody Screen: NEGATIVE

## 2023-03-23 LAB — APTT: aPTT: 26 s (ref 24–36)

## 2023-03-23 NOTE — Progress Notes (Signed)
Message has been sent to biotronik ppm rep [place] regarding the upcoming procedure.

## 2023-03-23 NOTE — Progress Notes (Signed)
PCP - Dr. Ceasar Mons [place] Cardiologist - Dr. Nanetta Batty - cardiology and Dr. Ladona Ridgel (EP)  PPM/ICD - Biotronik PPM Device Orders -  Rep Notified -   Chest x-ray - n/a EKG - 08-25-22 Stress Test - 05-10-15 ECHO - 09-28-22 Cardiac Cath - 05-11-15  Sleep Study - Denies CPAP - N/A  Fasting Blood Sugar - Denies Checks Blood Sugar _____ times a day N/A  Last dose of GLP1 agonist-  N/A GLP1 instructions: N/A  Blood Thinner Instructions: Aspirin Instructions: Per Dr. Allyson Sabal office can hold for 7 days prior.  Patient was not told and was informed during PAT appt.  Last dose was on 03/23/23 in the morning.   ERAS Protcol - Y PRE-SURGERY Ensure or G2-   COVID TEST- N/A   Anesthesia review:  Y, Biotonik PPM, cardiac clearance.  Left message with Vivien Rota, [employer] rep, awaiting call back to notify of procedure.  Staff message sent for cardiac device orders.   Patient denies shortness of breath, fever, cough and chest pain at PAT appointment. Patient denies any respiratory issues at this time.    All instructions explained to the patient, with a verbal understanding of the material. Patient agrees to go over the instructions while at home for a better understanding. Patient also instructed to self quarantine after being tested for COVID-19. The opportunity to ask questions was provided.

## 2023-03-25 ENCOUNTER — Encounter: Payer: Self-pay | Admitting: Internal Medicine

## 2023-03-25 NOTE — Progress Notes (Signed)
PERIOPERATIVE PRESCRIPTION FOR IMPLANTED CARDIAC DEVICE PROGRAMMING  Patient Information: Name:  Glenn Collins  DOB:  10/24/1945  MRN:  [id number]    Planned Procedure:  Left carotid endarterectomy  Surgeon:  Dr. Lemar Livings  Date of Procedure:  03-27-23  Cautery will be used.  Position during surgery:  supine   Please send documentation back to:  [facility] (Fax # [phone])  Device Information:  Clinic EP Physician:  Lewayne Bunting, MD   Device Type:  Pacemaker Manufacturer and Phone #:  Biotronik: [phone] Pacemaker Dependent?:  Yes.   Date of Last Device Check:  12/26/2022 Normal Device Function?:  Yes.    Electrophysiologist's Recommendations:  Have magnet available. Provide continuous ECG monitoring when magnet is used or reprogramming is to be performed.  Procedure will likely interfere with device function.  Device should be programmed:  Asynchronous pacing during procedure and returned to normal programming after procedure  Per Device Clinic Standing Orders, Lenor Coffin, RN  1:00 PM 03/25/2023

## 2023-03-26 NOTE — Anesthesia Preprocedure Evaluation (Signed)
Anesthesia Evaluation  Patient identified by MRN, date of birth, ID band Patient awake    Reviewed: Allergy & Precautions, H&P , NPO status , Patient's Chart, lab work & pertinent test results  Airway Mallampati: II   Neck ROM: full    Dental   Pulmonary former smoker   breath sounds clear to auscultation       Cardiovascular hypertension, + CAD, + Past MI and + Peripheral Vascular Disease  + dysrhythmias + pacemaker + Valvular Problems/Murmurs AS  Rhythm:regular Rate:Normal  H/o complete heart block.  Moderate AS. Preserved LV function.   Neuro/Psych    GI/Hepatic   Endo/Other    Renal/GU      Musculoskeletal  (+) Arthritis ,    Abdominal   Peds  Hematology   Anesthesia Other Findings   Reproductive/Obstetrics                             Anesthesia Physical Anesthesia Plan  ASA: 3  Anesthesia Plan: General   Post-op Pain Management:    Induction: Intravenous  PONV Risk Score and Plan: 2 and Ondansetron, Dexamethasone and Treatment may vary due to age or medical condition  Airway Management Planned: Oral ETT  Additional Equipment: Arterial line  Intra-op Plan:   Post-operative Plan: Extubation in OR  Informed Consent: I have reviewed the patients History and Physical, chart, labs and discussed the procedure including the risks, benefits and alternatives for the proposed anesthesia with the patient or authorized representative who has indicated his/her understanding and acceptance.     Dental advisory given  Plan Discussed with: CRNA, Anesthesiologist and Surgeon  Anesthesia Plan Comments: (PAT note by Antionette Poles, PA-C:  77 year old male follows with cardiology for history of PAD, HTN, complete heart block s/p Biotronik dual-chamber PPM implant 09/2019, moderate aortic stenosis.  Echo 09/2022 showed EF of 50 to 55%, moderate aortic stenosis with mean gradient 22 mmHg.   Recently found to have high-grade stenosis of the left ICA by both duplex and CTA.  He was referred to vascular surgery for management.  Cardiac risk stratification per telephone encounter 02/26/2023 by Joni Reining, NP, "Chart reviewed as part of pre-operative protocol coverage. Given past medical history and time since last visit, based on ACC/AHA guidelines, Chasten Crompton is at acceptable risk for the planned procedure without further cardiovascular testing. Surgery scheduled for 03/06/2023. Per Dr. Allyson Sabal "Cleared for LCEA. Can hold anti platelet agents if necessary " Per office protocol, if patient is without any new symptoms or concerns at the time of their virtual visit, he/she may hold ASA for 7 days prior to procedure. Please resume 7 as soon as possible postprocedure, at the discretion of the surgeon."  Preop labs reviewed, mild hyponatremia sodium 132, mild hypokalemia potassium 3.1, mild anemia with hemoglobin 12.8, otherwise unremarkable.  EKG 08/25/2022: Atrial sensed ventricular paced rhythm.  Rate 82.  Perioperative prescription for implanted cardiac device programming per progress note 03/25/2023: Device Information:  Clinic EP Physician:  Lewayne Bunting, MD   Device Type:  Pacemaker Manufacturer and Phone #:  Biotronik: [phone] Pacemaker Dependent?:  Yes.   Date of Last Device Check:  12/26/2022     Normal Device Function?:  Yes.    Electrophysiologist's Recommendations:   Have magnet available.  Provide continuous ECG monitoring when magnet is used or reprogramming is to be performed.   Procedure will likely interfere with device function.  Device should be programmed:  Asynchronous pacing during  procedure and returned to normal programming after procedure  Carotid duplex 01/01/2023: Summary:  Right Carotid: Velocities in the right ICA are consistent with a 1-39% stenosis. Non-hemodynamically significant plaque <50% noted in the CCA.  Left Carotid: Velocities  in the left ICA are consistent with a 80-99% stenosis. Non-hemodynamically significant plaque <50% noted in the CCA. The ECA appears >50% stenosed.  Vertebrals: Bilateral vertebral arteries demonstrate antegrade flow. Small caliber right vertebral artery.  Subclavians: Right subclavian artery was stenotic. Bilateral subclavian artery  flow was disturbed.   TTE 09/28/2022: 1. Left ventricular ejection fraction, by estimation, is 50 to 55%. The  left ventricle has low normal function. The left ventricle has no regional  wall motion abnormalities. There is mild concentric left ventricular  hypertrophy. Left ventricular  diastolic parameters are indeterminate. There is abnormal septal motion  secondary to pacing.  2. Right ventricular systolic function is normal. The right ventricular  size is normal. There is normal pulmonary artery systolic pressure. The  estimated right ventricular systolic pressure is 20.6 mmHg.  3. The mitral valve is grossly normal. Trivial mitral valve  regurgitation. No evidence of mitral stenosis.  4. The aortic valve is abnormal. There is moderate calcification of the  aortic valve. Aortic valve regurgitation is trivial. Moderate aortic valve  stenosis. Aortic valve area, by VTI measures 1.01 cm. Aortic valve mean  gradient measures 22.0 mmHg.  Aortic valve Vmax measures 3.34 m/s.  5. Ascending aorta measurements are within normal limits for age when  indexed to body surface area.  6. The inferior vena cava is normal in size with greater than 50%  respiratory variability, suggesting right atrial pressure of 3 mmHg.   Nuclear stress 09/09/2019: IMPRESSION: 1. No reversible ischemia or infarction.  2. Inferior wall hypokinesis.  3. Left ventricular ejection fraction 73%  4. Non invasive risk stratification*: Low  )        Anesthesia Quick Evaluation

## 2023-03-26 NOTE — Progress Notes (Signed)
Anesthesia Chart Review:  77 year old male follows with cardiology for history of PAD, HTN, complete heart block s/p Biotronik dual-chamber PPM implant 09/2019, moderate aortic stenosis.  Echo 09/2022 showed EF of 50 to 55%, moderate aortic stenosis with mean gradient 22 mmHg.  Recently found to have high-grade stenosis of the left ICA by both duplex and CTA.  He was referred to vascular surgery for management.  Cardiac risk stratification per telephone encounter 02/26/2023 by Joni Reining, NP, "Chart reviewed as part of pre-operative protocol coverage. Given past medical history and time since last visit, based on ACC/AHA guidelines, Glenn Collins is at acceptable risk for the planned procedure without further cardiovascular testing. Surgery scheduled for 03/06/2023. Per Dr. Allyson Sabal "Cleared for LCEA. Can hold anti platelet agents if necessary " Per office protocol, if patient is without any new symptoms or concerns at the time of their virtual visit, he/she may hold ASA for 7 days prior to procedure. Please resume 7 as soon as possible postprocedure, at the discretion of the surgeon."  Preop labs reviewed, mild hyponatremia sodium 132, mild hypokalemia potassium 3.1, mild anemia with hemoglobin 12.8, otherwise unremarkable.  EKG 08/25/2022: Atrial sensed ventricular paced rhythm.  Rate 82.  Perioperative prescription for implanted cardiac device programming per progress note 03/25/2023: Device Information:   Clinic EP Physician:  Lewayne Bunting, MD    Device Type:  Pacemaker Manufacturer and Phone #:  Biotronik: [phone] Pacemaker Dependent?:  Yes.   Date of Last Device Check:  12/26/2022     Normal Device Function?:  Yes.     Electrophysiologist's Recommendations:   Have magnet available. Provide continuous ECG monitoring when magnet is used or reprogramming is to be performed.  Procedure will likely interfere with device function.  Device should be programmed:  Asynchronous pacing during  procedure and returned to normal programming after procedure  Carotid duplex 01/01/2023: Summary:  Right Carotid: Velocities in the right ICA are consistent with a 1-39% stenosis. Non-hemodynamically significant plaque <50% noted in the CCA.  Left Carotid: Velocities in the left ICA are consistent with a 80-99% stenosis. Non-hemodynamically significant plaque <50% noted in the CCA. The ECA appears >50% stenosed.  Vertebrals:  Bilateral vertebral arteries demonstrate antegrade flow. Small caliber right vertebral artery.  Subclavians: Right subclavian artery was stenotic. Bilateral subclavian artery  flow was disturbed.   TTE 09/28/2022:  1. Left ventricular ejection fraction, by estimation, is 50 to 55%. The  left ventricle has low normal function. The left ventricle has no regional  wall motion abnormalities. There is mild concentric left ventricular  hypertrophy. Left ventricular  diastolic parameters are indeterminate. There is abnormal septal motion  secondary to pacing.   2. Right ventricular systolic function is normal. The right ventricular  size is normal. There is normal pulmonary artery systolic pressure. The  estimated right ventricular systolic pressure is 20.6 mmHg.   3. The mitral valve is grossly normal. Trivial mitral valve  regurgitation. No evidence of mitral stenosis.   4. The aortic valve is abnormal. There is moderate calcification of the  aortic valve. Aortic valve regurgitation is trivial. Moderate aortic valve  stenosis. Aortic valve area, by VTI measures 1.01 cm. Aortic valve mean  gradient measures 22.0 mmHg.  Aortic valve Vmax measures 3.34 m/s.   5. Ascending aorta measurements are within normal limits for age when  indexed to body surface area.   6. The inferior vena cava is normal in size with greater than 50%  respiratory variability, suggesting right atrial pressure of  3 mmHg.   Nuclear stress 09/09/2019: IMPRESSION: 1. No reversible ischemia or  infarction.   2. Inferior wall hypokinesis.   3. Left ventricular ejection fraction 73%   4. Non invasive risk stratification*: Low    [facility]enn [facility]/Anesthesiology Phone [phone] 03/26/2023 9:30 AM

## 2023-03-27 ENCOUNTER — Other Ambulatory Visit: Payer: Self-pay

## 2023-03-27 ENCOUNTER — Encounter (HOSPITAL_COMMUNITY)
Admission: RE | Disposition: A | Discharge status: Home Health | Payer: Self-pay | Source: Home / Self Care | Attending: Vascular Surgery

## 2023-03-27 ENCOUNTER — Inpatient Hospital Stay (HOSPITAL_COMMUNITY): Payer: Self-pay | Admitting: Anesthesiology

## 2023-03-27 ENCOUNTER — Encounter (HOSPITAL_COMMUNITY): Payer: Self-pay | Admitting: Vascular Surgery

## 2023-03-27 ENCOUNTER — Inpatient Hospital Stay (HOSPITAL_COMMUNITY): Payer: Self-pay | Admitting: Physician Assistant

## 2023-03-27 ENCOUNTER — Inpatient Hospital Stay (HOSPITAL_COMMUNITY)
Admission: RE | Admit: 2023-03-27 | Discharge: 2023-03-28 | DRG: 038 | Disposition: A | Discharge status: Home Health | Payer: Medicare Other | Attending: Vascular Surgery | Admitting: Vascular Surgery

## 2023-03-27 DIAGNOSIS — Z955 Presence of coronary angioplasty implant and graft: Secondary | ICD-10-CM

## 2023-03-27 DIAGNOSIS — Z7982 Long term (current) use of aspirin: Secondary | ICD-10-CM

## 2023-03-27 DIAGNOSIS — E876 Hypokalemia: Secondary | ICD-10-CM | POA: Diagnosis present

## 2023-03-27 DIAGNOSIS — Z822 Family history of deafness and hearing loss: Secondary | ICD-10-CM | POA: Diagnosis not present

## 2023-03-27 DIAGNOSIS — I6522 Occlusion and stenosis of left carotid artery: Secondary | ICD-10-CM

## 2023-03-27 DIAGNOSIS — I2510 Atherosclerotic heart disease of native coronary artery without angina pectoris: Secondary | ICD-10-CM | POA: Diagnosis present

## 2023-03-27 DIAGNOSIS — E871 Hypo-osmolality and hyponatremia: Secondary | ICD-10-CM | POA: Diagnosis present

## 2023-03-27 DIAGNOSIS — Z809 Family history of malignant neoplasm, unspecified: Secondary | ICD-10-CM

## 2023-03-27 DIAGNOSIS — I350 Nonrheumatic aortic (valve) stenosis: Secondary | ICD-10-CM | POA: Diagnosis present

## 2023-03-27 DIAGNOSIS — Z9889 Other specified postprocedural states: Secondary | ICD-10-CM | POA: Diagnosis not present

## 2023-03-27 DIAGNOSIS — Z8679 Personal history of other diseases of the circulatory system: Secondary | ICD-10-CM | POA: Diagnosis not present

## 2023-03-27 DIAGNOSIS — Z96641 Presence of right artificial hip joint: Secondary | ICD-10-CM | POA: Diagnosis present

## 2023-03-27 DIAGNOSIS — R011 Cardiac murmur, unspecified: Secondary | ICD-10-CM | POA: Diagnosis present

## 2023-03-27 DIAGNOSIS — I442 Atrioventricular block, complete: Secondary | ICD-10-CM | POA: Diagnosis present

## 2023-03-27 DIAGNOSIS — I6529 Occlusion and stenosis of unspecified carotid artery: Secondary | ICD-10-CM | POA: Diagnosis present

## 2023-03-27 DIAGNOSIS — I10 Essential (primary) hypertension: Secondary | ICD-10-CM | POA: Diagnosis present

## 2023-03-27 DIAGNOSIS — Z87891 Personal history of nicotine dependence: Secondary | ICD-10-CM | POA: Diagnosis not present

## 2023-03-27 DIAGNOSIS — Z87442 Personal history of urinary calculi: Secondary | ICD-10-CM

## 2023-03-27 DIAGNOSIS — Z823 Family history of stroke: Secondary | ICD-10-CM

## 2023-03-27 DIAGNOSIS — Z79899 Other long term (current) drug therapy: Secondary | ICD-10-CM

## 2023-03-27 DIAGNOSIS — Z950 Presence of cardiac pacemaker: Secondary | ICD-10-CM | POA: Diagnosis not present

## 2023-03-27 DIAGNOSIS — I739 Peripheral vascular disease, unspecified: Secondary | ICD-10-CM | POA: Diagnosis present

## 2023-03-27 DIAGNOSIS — E785 Hyperlipidemia, unspecified: Secondary | ICD-10-CM | POA: Diagnosis present

## 2023-03-27 DIAGNOSIS — I252 Old myocardial infarction: Secondary | ICD-10-CM | POA: Diagnosis not present

## 2023-03-27 DIAGNOSIS — Z86718 Personal history of other venous thrombosis and embolism: Secondary | ICD-10-CM

## 2023-03-27 HISTORY — PX: ENDARTERECTOMY: SHX5162

## 2023-03-27 HISTORY — PX: PATCH ANGIOPLASTY: SHX6230

## 2023-03-27 LAB — CBC
HCT: 33.5 % — ABNORMAL LOW (ref 39.0–52.0)
Hemoglobin: 11.8 g/dL — ABNORMAL LOW (ref 13.0–17.0)
MCH: 32.1 pg (ref 26.0–34.0)
MCHC: 35.2 g/dL (ref 30.0–36.0)
MCV: 91 fL (ref 80.0–100.0)
Platelets: 211 10*3/uL (ref 150–400)
RBC: 3.68 MIL/uL — ABNORMAL LOW (ref 4.22–5.81)
RDW: 13.5 % (ref 11.5–15.5)
WBC: 9.1 10*3/uL (ref 4.0–10.5)
nRBC: 0 % (ref 0.0–0.2)

## 2023-03-27 LAB — CREATININE, SERUM
Creatinine, Ser: 0.93 mg/dL (ref 0.61–1.24)
GFR, Estimated: >60 mL/min (ref >60)

## 2023-03-27 LAB — POCT ACTIVATED CLOTTING TIME: Activated Clotting Time: 251 s

## 2023-03-27 SURGERY — ENDARTERECTOMY, CAROTID
Anesthesia: General | Site: Neck | Laterality: Left

## 2023-03-27 MED ORDER — EZETIMIBE 10 MG PO TABS
10.0000 mg | ORAL_TABLET | Freq: Every day | ORAL | Status: DC
  Filled 2023-03-27: qty 1

## 2023-03-27 MED ORDER — HEPARIN 6000 UNIT IRRIGATION SOLUTION
Status: AC
  Filled 2023-03-27: qty 500

## 2023-03-27 MED ORDER — METOPROLOL TARTRATE 5 MG/5ML IV SOLN
2.0000 mg | INTRAVENOUS | Status: DC | PRN
Start: 2023-03-27 — End: 2023-03-28

## 2023-03-27 MED ORDER — ACETAMINOPHEN 650 MG RE SUPP: 325.0000 mg | RECTAL | Status: DC | PRN

## 2023-03-27 MED ORDER — PHENYLEPHRINE 80 MCG/ML (10ML) SYRINGE FOR IV PUSH (FOR BLOOD PRESSURE SUPPORT)
PREFILLED_SYRINGE | INTRAVENOUS | Status: AC
Start: 2023-03-27 — End: ?
  Filled 2023-03-27: qty 10

## 2023-03-27 MED ORDER — ROCURONIUM BROMIDE 10 MG/ML (PF) SYRINGE
PREFILLED_SYRINGE | INTRAVENOUS | Status: DC | PRN
  Administered 2023-03-27 (×2): 20 mg via INTRAVENOUS
  Administered 2023-03-27: 50 mg via INTRAVENOUS

## 2023-03-27 MED ORDER — SENNOSIDES-DOCUSATE SODIUM 8.6-50 MG PO TABS: 1.0000 | ORAL_TABLET | Freq: Every evening | ORAL | Status: DC | PRN

## 2023-03-27 MED ORDER — ALUM & MAG HYDROXIDE-SIMETH 200-200-20 MG/5ML PO SUSP: 15.0000 mL | ORAL | Status: DC | PRN

## 2023-03-27 MED ORDER — CHLORHEXIDINE GLUCONATE CLOTH 2 % EX PADS
6.0000 | MEDICATED_PAD | Freq: Once | CUTANEOUS | Status: DC
Start: 2023-03-27 — End: 2023-03-27

## 2023-03-27 MED ORDER — HYDROMORPHONE HCL 1 MG/ML IJ SOLN: 0.5000 mg | INTRAMUSCULAR | Status: DC | PRN

## 2023-03-27 MED ORDER — OXYCODONE HCL 5 MG/5ML PO SOLN: 5.0000 mg | Freq: Once | ORAL | Status: DC | PRN

## 2023-03-27 MED ORDER — SODIUM CHLORIDE 0.9 % IV SOLN: 250.0000 mL | INTRAVENOUS | Status: DC | PRN

## 2023-03-27 MED ORDER — SODIUM CHLORIDE 0.9 % IV SOLN
500.0000 mL | Freq: Once | INTRAVENOUS | Status: DC | PRN
Start: 2023-03-27 — End: 2023-03-28

## 2023-03-27 MED ORDER — ONDANSETRON HCL 4 MG/2ML IJ SOLN: 4.0000 mg | Freq: Four times a day (QID) | INTRAMUSCULAR | Status: DC | PRN

## 2023-03-27 MED ORDER — DOCUSATE SODIUM 100 MG PO CAPS
100.0000 mg | ORAL_CAPSULE | Freq: Every day | ORAL | Status: DC
  Filled 2023-03-27: qty 1

## 2023-03-27 MED ORDER — CEFAZOLIN SODIUM-DEXTROSE 2-4 GM/100ML-% IV SOLN
2.0000 g | INTRAVENOUS | Status: AC
  Administered 2023-03-27: 2 g via INTRAVENOUS
  Filled 2023-03-27: qty 100

## 2023-03-27 MED ORDER — CEFAZOLIN SODIUM-DEXTROSE 2-4 GM/100ML-% IV SOLN
2.0000 g | Freq: Three times a day (TID) | INTRAVENOUS | Status: AC
Start: 2023-03-27 — End: 2023-03-28
  Administered 2023-03-27 – 2023-03-28 (×2): 2 g via INTRAVENOUS
  Filled 2023-03-27 (×2): qty 100

## 2023-03-27 MED ORDER — ACETAMINOPHEN 325 MG PO TABS: 325.0000 mg | ORAL_TABLET | ORAL | Status: DC | PRN

## 2023-03-27 MED ORDER — LIDOCAINE 2% (20 MG/ML) 5 ML SYRINGE
INTRAMUSCULAR | Status: AC
Start: 2023-03-27 — End: ?
  Filled 2023-03-27: qty 5

## 2023-03-27 MED ORDER — BISACODYL 5 MG PO TBEC: 5.0000 mg | DELAYED_RELEASE_TABLET | Freq: Every day | ORAL | Status: DC | PRN

## 2023-03-27 MED ORDER — HEPARIN SODIUM (PORCINE) 1000 UNIT/ML IJ SOLN
INTRAMUSCULAR | Status: DC | PRN
  Administered 2023-03-27: 10000 [IU] via INTRAVENOUS

## 2023-03-27 MED ORDER — HEPARIN SODIUM (PORCINE) 5000 UNIT/ML IJ SOLN
5000.0000 [IU] | Freq: Three times a day (TID) | INTRAMUSCULAR | Status: DC
  Administered 2023-03-28: 5000 [IU] via SUBCUTANEOUS
  Filled 2023-03-27: qty 1

## 2023-03-27 MED ORDER — ONDANSETRON HCL 4 MG/2ML IJ SOLN
INTRAMUSCULAR | Status: AC
Start: 2023-03-27 — End: ?
  Filled 2023-03-27: qty 2

## 2023-03-27 MED ORDER — OXYCODONE HCL 5 MG PO TABS: 5.0000 mg | ORAL_TABLET | ORAL | Status: DC | PRN

## 2023-03-27 MED ORDER — SODIUM CHLORIDE 0.9% FLUSH: 3.0000 mL | INTRAVENOUS | Status: DC | PRN

## 2023-03-27 MED ORDER — FENTANYL CITRATE (PF) 100 MCG/2ML IJ SOLN: 25.0000 ug | INTRAMUSCULAR | Status: DC | PRN

## 2023-03-27 MED ORDER — ORAL CARE MOUTH RINSE: 15.0000 mL | Freq: Once | OROMUCOSAL | Status: AC

## 2023-03-27 MED ORDER — FENTANYL CITRATE (PF) 250 MCG/5ML IJ SOLN
INTRAMUSCULAR | Status: DC | PRN
  Administered 2023-03-27: 125 ug via INTRAVENOUS
  Administered 2023-03-27: 75 ug via INTRAVENOUS

## 2023-03-27 MED ORDER — PHENYLEPHRINE 80 MCG/ML (10ML) SYRINGE FOR IV PUSH (FOR BLOOD PRESSURE SUPPORT)
PREFILLED_SYRINGE | INTRAVENOUS | Status: DC | PRN
  Administered 2023-03-27 (×5): 80 ug via INTRAVENOUS

## 2023-03-27 MED ORDER — POTASSIUM CHLORIDE CRYS ER 20 MEQ PO TBCR
20.0000 meq | EXTENDED_RELEASE_TABLET | Freq: Every day | ORAL | Status: AC | PRN
Start: 2023-03-27 — End: 2023-03-28
  Administered 2023-03-28: 40 meq via ORAL
  Filled 2023-03-27: qty 2

## 2023-03-27 MED ORDER — LIDOCAINE HCL (PF) 1 % IJ SOLN
INTRAMUSCULAR | Status: AC
  Filled 2023-03-27: qty 30

## 2023-03-27 MED ORDER — 0.9 % SODIUM CHLORIDE (POUR BTL) OPTIME
TOPICAL | Status: DC | PRN
  Administered 2023-03-27: 2000 mL

## 2023-03-27 MED ORDER — HEPARIN 6000 UNIT IRRIGATION SOLUTION
Status: DC | PRN
  Administered 2023-03-27: 1

## 2023-03-27 MED ORDER — DEXAMETHASONE SODIUM PHOSPHATE 10 MG/ML IJ SOLN
INTRAMUSCULAR | Status: AC
  Filled 2023-03-27: qty 1

## 2023-03-27 MED ORDER — CHLORTHALIDONE 25 MG PO TABS
25.0000 mg | ORAL_TABLET | Freq: Every day | ORAL | Status: DC
  Filled 2023-03-27: qty 1

## 2023-03-27 MED ORDER — MAGNESIUM SULFATE 2 GM/50ML IV SOLN: 2.0000 g | Freq: Every day | INTRAVENOUS | Status: DC | PRN

## 2023-03-27 MED ORDER — LABETALOL HCL 5 MG/ML IV SOLN: 10.0000 mg | INTRAVENOUS | Status: DC | PRN

## 2023-03-27 MED ORDER — PHENOL 1.4 % MT LIQD: 1.0000 | OROMUCOSAL | Status: DC | PRN

## 2023-03-27 MED ORDER — SODIUM CHLORIDE 0.9 % IV SOLN: INTRAVENOUS | Status: DC

## 2023-03-27 MED ORDER — PROPOFOL 10 MG/ML IV BOLUS
INTRAVENOUS | Status: AC
  Filled 2023-03-27: qty 20

## 2023-03-27 MED ORDER — GUAIFENESIN-DM 100-10 MG/5ML PO SYRP
15.0000 mL | ORAL_SOLUTION | ORAL | Status: DC | PRN
Start: 2023-03-27 — End: 2023-03-28

## 2023-03-27 MED ORDER — PHENYLEPHRINE HCL-NACL 20-0.9 MG/250ML-% IV SOLN
INTRAVENOUS | Status: DC | PRN
  Administered 2023-03-27: 10 ug/min via INTRAVENOUS

## 2023-03-27 MED ORDER — OXYCODONE HCL 5 MG PO TABS: 5.0000 mg | ORAL_TABLET | Freq: Once | ORAL | Status: DC | PRN

## 2023-03-27 MED ORDER — HYDRALAZINE HCL 20 MG/ML IJ SOLN: 5.0000 mg | INTRAMUSCULAR | Status: DC | PRN

## 2023-03-27 MED ORDER — FENTANYL CITRATE (PF) 250 MCG/5ML IJ SOLN
INTRAMUSCULAR | Status: AC
  Filled 2023-03-27: qty 5

## 2023-03-27 MED ORDER — ATORVASTATIN CALCIUM 80 MG PO TABS
80.0000 mg | ORAL_TABLET | Freq: Every day | ORAL | Status: DC
  Filled 2023-03-27: qty 1

## 2023-03-27 MED ORDER — CHLORHEXIDINE GLUCONATE 0.12 % MT SOLN
15.0000 mL | Freq: Once | OROMUCOSAL | Status: AC
  Administered 2023-03-27: 15 mL via OROMUCOSAL
  Filled 2023-03-27: qty 15

## 2023-03-27 MED ORDER — ONDANSETRON HCL 4 MG/2ML IJ SOLN
INTRAMUSCULAR | Status: DC | PRN
  Administered 2023-03-27: 4 mg via INTRAVENOUS

## 2023-03-27 MED ORDER — HEMOSTATIC AGENTS (NO CHARGE) OPTIME
TOPICAL | Status: DC | PRN
  Administered 2023-03-27: 1 via TOPICAL

## 2023-03-27 MED ORDER — LIDOCAINE 2% (20 MG/ML) 5 ML SYRINGE
INTRAMUSCULAR | Status: AC
  Filled 2023-03-27: qty 5

## 2023-03-27 MED ORDER — SUGAMMADEX SODIUM 200 MG/2ML IV SOLN
INTRAVENOUS | Status: DC | PRN
  Administered 2023-03-27: 200 mg via INTRAVENOUS

## 2023-03-27 MED ORDER — SODIUM CHLORIDE 0.9% FLUSH: 3.0000 mL | Freq: Two times a day (BID) | INTRAVENOUS | Status: DC

## 2023-03-27 MED ORDER — DEXMEDETOMIDINE HCL IN NACL 200 MCG/50ML IV SOLN
INTRAVENOUS | Status: DC | PRN
  Administered 2023-03-27 (×3): 4 ug via INTRAVENOUS

## 2023-03-27 MED ORDER — LACTATED RINGERS IV SOLN
INTRAVENOUS | Status: DC
Start: 2023-03-27 — End: 2023-03-27

## 2023-03-27 MED ORDER — ROCURONIUM BROMIDE 10 MG/ML (PF) SYRINGE
PREFILLED_SYRINGE | INTRAVENOUS | Status: AC
  Filled 2023-03-27: qty 10

## 2023-03-27 MED ORDER — PROPOFOL 10 MG/ML IV BOLUS
INTRAVENOUS | Status: DC | PRN
  Administered 2023-03-27: 100 mg via INTRAVENOUS

## 2023-03-27 MED ORDER — PANTOPRAZOLE SODIUM 40 MG PO TBEC
40.0000 mg | DELAYED_RELEASE_TABLET | Freq: Every day | ORAL | Status: DC
  Filled 2023-03-27: qty 1

## 2023-03-27 MED ORDER — SODIUM CHLORIDE 0.9 % IV SOLN: INTRAVENOUS | Status: DC | PRN

## 2023-03-27 MED ORDER — ACETAMINOPHEN 10 MG/ML IV SOLN
INTRAVENOUS | Status: DC | PRN
  Administered 2023-03-27: 1000 mg via INTRAVENOUS

## 2023-03-27 MED ORDER — PROTAMINE SULFATE 10 MG/ML IV SOLN
INTRAVENOUS | Status: DC | PRN
Start: 2023-03-27 — End: 2023-03-27
  Administered 2023-03-27: 40 mg via INTRAVENOUS
  Administered 2023-03-27: 10 mg via INTRAVENOUS

## 2023-03-27 MED ORDER — ASPIRIN 81 MG PO TBEC
81.0000 mg | DELAYED_RELEASE_TABLET | Freq: Every day | ORAL | Status: DC
  Administered 2023-03-28: 81 mg via ORAL
  Filled 2023-03-27: qty 1

## 2023-03-27 MED ORDER — LIDOCAINE 2% (20 MG/ML) 5 ML SYRINGE
INTRAMUSCULAR | Status: DC | PRN
  Administered 2023-03-27: 60 mg via INTRAVENOUS

## 2023-03-27 MED ORDER — DEXAMETHASONE SODIUM PHOSPHATE 10 MG/ML IJ SOLN
INTRAMUSCULAR | Status: DC | PRN
  Administered 2023-03-27: 10 mg via INTRAVENOUS

## 2023-03-27 SURGICAL SUPPLY — 41 items
BAG COUNTER SPONGE SURGICOUNT (BAG) ×2 IMPLANT
BAG DECANTER FOR FLEXI CONT (MISCELLANEOUS) ×2 IMPLANT
CANISTER SUCT 3000ML PPV (MISCELLANEOUS) ×2 IMPLANT
CATH ROBINSON RED A/P 18FR (CATHETERS) ×2 IMPLANT
CLIP LIGATING EXTRA MED SLVR (CLIP) ×2 IMPLANT
CLIP LIGATING EXTRA SM BLUE (MISCELLANEOUS) ×2 IMPLANT
COVER PROBE W GEL 5X96 (DRAPES) ×2 IMPLANT
DERMABOND ADVANCED .7 DNX12 (GAUZE/BANDAGES/DRESSINGS) ×2 IMPLANT
ELECT REM PT RETURN 9FT ADLT (ELECTROSURGICAL) ×2 IMPLANT
ELECTRODE REM PT RTRN 9FT ADLT (ELECTROSURGICAL) ×2 IMPLANT
GLOVE BIO SURGEON STRL SZ7.5 (GLOVE) ×2 IMPLANT
GOWN STRL REUS W/ TWL LRG LVL3 (GOWN DISPOSABLE) ×4 IMPLANT
GOWN STRL REUS W/ TWL XL LVL3 (GOWN DISPOSABLE) ×2 IMPLANT
HEMOSTAT SNOW SURGICEL 2X4 (HEMOSTASIS) IMPLANT
INSERT FOGARTY SM (MISCELLANEOUS) IMPLANT
IV ADAPTER SYR DOUBLE MALE LL (MISCELLANEOUS) IMPLANT
KIT BASIN OR (CUSTOM PROCEDURE TRAY) ×2 IMPLANT
KIT SHUNT ARGYLE CAROTID ART 6 (VASCULAR PRODUCTS) ×2 IMPLANT
KIT TURNOVER KIT B (KITS) ×2 IMPLANT
LOOP VESSEL MINI RED (MISCELLANEOUS) IMPLANT
NDL HYPO 25GX1X1/2 BEV (NEEDLE) IMPLANT
NDL SPNL 20GX3.5 QUINCKE YW (NEEDLE) IMPLANT
NEEDLE HYPO 25GX1X1/2 BEV (NEEDLE) IMPLANT
NEEDLE SPNL 20GX3.5 QUINCKE YW (NEEDLE) IMPLANT
NS IRRIG 1000ML POUR BTL (IV SOLUTION) ×6 IMPLANT
PACK CAROTID (CUSTOM PROCEDURE TRAY) ×2 IMPLANT
PAD ARMBOARD 7.5X6 YLW CONV (MISCELLANEOUS) ×4 IMPLANT
PATCH VASC XENOSURE 1X6 (Vascular Products) IMPLANT
POSITIONER HEAD DONUT 9IN (MISCELLANEOUS) ×2 IMPLANT
POWDER SURGICEL 3.0 GRAM (HEMOSTASIS) IMPLANT
STOPCOCK 4 WAY LG BORE MALE ST (IV SETS) IMPLANT
SUT ETHILON 3 0 PS 1 (SUTURE) IMPLANT
SUT MNCRL AB 4-0 PS2 18 (SUTURE) ×2 IMPLANT
SUT PROLENE 6 0 BV (SUTURE) ×2 IMPLANT
SUT PROLENE 7 0 BV1 MDA (SUTURE) IMPLANT
SUT SILK 3-0 18XBRD TIE 12 (SUTURE) IMPLANT
SUT VIC AB 3-0 SH 27X BRD (SUTURE) ×2 IMPLANT
SYR CONTROL 10ML LL (SYRINGE) IMPLANT
TOWEL GREEN STERILE (TOWEL DISPOSABLE) ×2 IMPLANT
TUBING ART PRESS 48 MALE/FEM (TUBING) IMPLANT
WATER STERILE IRR 1000ML POUR (IV SOLUTION) ×2 IMPLANT

## 2023-03-27 NOTE — Transfer of Care (Signed)
Immediate Anesthesia Transfer of Care Note  Patient: Glenn Collins  Procedure(s) Performed: ENDARTERECTOMY CAROTID (Left) XENOSURE PATCH ANGIOPLASTY (Left: Neck)  Patient Location: PACU  Anesthesia Type:General  Level of Consciousness: awake, alert , oriented, and patient cooperative  Airway & Oxygen Therapy: Patient Spontanous Breathing and Patient connected to face mask oxygen  Post-op Assessment: Report given to RN and Post -op Vital signs reviewed and stable  Post vital signs: Reviewed and stable   Last Vitals:  Vitals Value Taken Time  BP 101/72 03/27/23 1000  Temp    Pulse 80 03/27/23 1002  Resp 17 03/27/23 1002  SpO2 91 % 03/27/23 1002  Vitals shown include unfiled device data.  Last Pain:  Vitals:   03/27/23 0616  TempSrc:   PainSc: 0-No pain         Complications: There were no known notable events for this encounter.

## 2023-03-27 NOTE — Anesthesia Procedure Notes (Addendum)
Procedure Name: Intubation Date/Time: 03/27/2023 7:42 AM  Performed by: Ammie Dalton, CRNAPre-anesthesia Checklist: Patient identified, Emergency Drugs available, Suction available and Patient being monitored Patient Re-evaluated:Patient Re-evaluated prior to induction Oxygen Delivery Method: Circle System Utilized Preoxygenation: Pre-oxygenation with 100% oxygen Induction Type: IV induction Ventilation: Two handed mask ventilation required and Oral airway inserted - appropriate to patient size Laryngoscope Size: Hyacinth Meeker and 2 Grade View: Grade I Tube type: Oral Tube size: 7.5 mm Number of attempts: 1 Airway Equipment and Method: Stylet and Oral airway Placement Confirmation: ETT inserted through vocal cords under direct vision, positive ETCO2 and breath sounds checked- equal and bilateral Secured at: 23 cm Tube secured with: Tape Dental Injury: Teeth and Oropharynx as per pre-operative assessment  Comments: Performed by Carroll Sage, SRNA

## 2023-03-27 NOTE — Anesthesia Procedure Notes (Addendum)
Arterial Line Insertion Start/End12/01/2023 6:45 AM, 03/27/2023 7:10 AM Performed by: Carroll Sage, RN  Patient location: [facility] procedure area. Preanesthetic checklist: patient identified, IV checked, site marked, risks and benefits discussed, surgical consent, monitors and equipment checked, pre-op evaluation, timeout performed and anesthesia consent Lidocaine 1% used for infiltration Right, radial was placed Catheter size: 20 G Hand hygiene performed   Attempts: 1 Procedure performed without using ultrasound guided technique. Following insertion, dressing applied and Biopatch. Post procedure assessment: normal  Patient tolerated the procedure well with no immediate complications.

## 2023-03-27 NOTE — H&P (Signed)
HPI:   Glenn Collins is a 77 y.o. male originally from [place] and I have seen in the past for pain in his bilateral lower extremities right greater than left.  Currently states that his legs are okay.  He is followed by Dr. Allyson Sabal with cardiology who has identified high-grade stenosis of the left ICA by both duplex and CTA.  Patient denies any history of stroke, TIA or amaurosis.  He is here today to discuss the results of his recent testing.  Patient is currently taking aspirin and a statin.       Past Medical History:  Diagnosis Date   Arthritis     CAD (coronary artery disease) 10/27/2018   Carotid artery occlusion     Coronary artery disease     Heart murmur Since childhood   Hypertension     PAD (peripheral artery disease) (HCC) 10/27/2018   Presence of permanent cardiac pacemaker      Biotronik PPM             Family History  Problem Relation Age of Onset   Cancer Mother     Hearing loss Mother     CVA Father               Past Surgical History:  Procedure Laterality Date   CARDIAC SURGERY   2019    with stents x2   HERNIA REPAIR   2012   PACEMAKER IMPLANT N/A 09/29/2019    Procedure: PACEMAKER IMPLANT;  Surgeon: Marinus Maw, MD;  Location: [facility];  Service: Cardiovascular;  Laterality: N/A;   TOTAL HIP ARTHROPLASTY Right 06/15/2021    Procedure: TOTAL HIP ARTHROPLASTY ANTERIOR APPROACH;  Surgeon: Samson Frederic, MD;  Location: [facility];  Service: Orthopedics;  Laterality: Right;          Short Social History:  Social History         Tobacco Use   Smoking status: Former      Current packs/day: 0.00      Average packs/day: 1.5 packs/day for 40.0 years (60.0 ttl pk-yrs)      Types: Cigarettes      Start date: 10/15/1960      Quit date: 10/15/2000      Years since quitting: 22.3   Smokeless tobacco: Never  Substance Use Topics   Alcohol use: Yes      Alcohol/week: 28.0 standard drinks of alcohol      Types: 28 Glasses of wine per  week      Comment: 4 glasses per day      Allergies  No Known Allergies           Current Outpatient Medications  Medication Sig Dispense Refill   aspirin EC 81 MG tablet Take 81 mg by mouth daily. Swallow whole.       atorvastatin (LIPITOR) 80 MG tablet TAKE ONE TABLET BY MOUTH DAILY AT 6PM. 90 tablet 3   chlorthalidone (HYGROTON) 25 MG tablet Take 1 tablet (25 mg total) by mouth daily. 90 tablet 3   diphenhydrAMINE (BENADRYL) 25 MG tablet Take 25 mg by mouth at bedtime as needed for sleep or allergies.       ezetimibe (ZETIA) 10 MG tablet Take 1 tablet (10 mg total) by mouth daily. 90 tablet 3   metoprolol succinate (TOPROL-XL) 25 MG 24 hr tablet Take 1 tablet (25 mg total) by mouth daily. 30 tablet 11   Multiple Vitamin (MULTIVITAMIN WITH MINERALS) TABS tablet Take 1  tablet by mouth daily.       nitroGLYCERIN (NITROSTAT) 0.4 MG SL tablet Place 1 tablet (0.4 mg total) under the tongue every 5 (five) minutes x 3 doses as needed for chest pain. 25 tablet 1   sodium chloride (OCEAN) 0.65 % SOLN nasal spray Place 1 spray into both nostrils as needed for congestion.          No current facility-administered medications for this visit.        Review of Systems  Constitutional:  Constitutional negative. HENT: HENT negative.  Eyes: Eyes negative.  Respiratory: Respiratory negative.  Cardiovascular: Positive for claudication.  GI: Gastrointestinal negative.  Musculoskeletal: Musculoskeletal negative.  Skin: Skin negative.  Neurological: Neurological negative. Hematologic: Hematologic/lymphatic negative.  Psychiatric: Psychiatric negative.          Objective:    Vitals:   03/27/23 0556  BP: 133/86  Pulse: 89  Resp: 18  Temp: 98.6 F (37 C)  SpO2: 94%      Physical Exam HENT:     Head: Normocephalic.     Nose: Nose normal.  Eyes:     Pupils: Pupils are equal, round, and reactive to light.  Neck:     Vascular: No carotid bruit.  Cardiovascular:     Rate and  Rhythm: Normal rate.     Heart sounds: No murmur heard. Pulmonary:     Effort: Pulmonary effort is normal.  Musculoskeletal:        General: Normal range of motion.     Right lower leg: No edema.     Left lower leg: No edema.  Skin:    General: Skin is warm.     Capillary Refill: Capillary refill takes less than 2 seconds.  Neurological:     General: No focal deficit present.     Mental Status: He is alert.  Psychiatric:        Mood and Affect: Mood normal.        Data: Right Carotid Findings:  +----------+--------+--------+--------+------------------+--------+           PSV cm/sEDV cm/sStenosisPlaque DescriptionComments  +----------+--------+--------+--------+------------------+--------+  CCA Prox  57      6                                           +----------+--------+--------+--------+------------------+--------+  CCA Distal81      15      <50%    heterogenous                +----------+--------+--------+--------+------------------+--------+  ICA Prox  90      16              heterogenous                +----------+--------+--------+--------+------------------+--------+  ICA Mid   103     31      1-39%   heterogenous                +----------+--------+--------+--------+------------------+--------+  ICA Distal98      21                                          +----------+--------+--------+--------+------------------+--------+  ECA      83      8                                           +----------+--------+--------+--------+------------------+--------+   +----------+--------+-------+----------------------+-------------------+  PSV cm/sEDV cmsDescribe              Arm Pressure (mmHG)  +----------+--------+-------+----------------------+-------------------+  [id number]           Stenotic and Turbulent132                  +----------+--------+-------+----------------------+-------------------+    +---------+--------+--+--------+-+---------------------------+  VertebralPSV cm/s29EDV cm/s7Antegrade and Small caliber  +---------+--------+--+--------+-+---------------------------+      Left Carotid Findings:  +----------+--------+--------+--------+------------------+---------+           PSV cm/sEDV cm/sStenosisPlaque DescriptionComments   +----------+--------+--------+--------+------------------+---------+  CCA Prox  95      12                                           +----------+--------+--------+--------+------------------+---------+  CCA Distal63      14      <50%    heterogenous                 +----------+--------+--------+--------+------------------+---------+  ICA Prox  354     118     80-99%  calcific          Shadowing  +----------+--------+--------+--------+------------------+---------+  ICA Mid   63      17                                           +----------+--------+--------+--------+------------------+---------+  ICA Distal58      18                                           +----------+--------+--------+--------+------------------+---------+  ECA      219     15      >50%    heterogenous                 +----------+--------+--------+--------+------------------+---------+   +----------+--------+--------+---------+-------------------+           PSV cm/sEDV cm/sDescribe Arm Pressure (mmHG)  +----------+--------+--------+---------+-------------------+  [id number]            Turbulent126                  +----------+--------+--------+---------+-------------------+   +---------+--------+--+--------+--+---------+  VertebralPSV cm/s59EDV cm/s18Antegrade  +---------+--------+--+--------+--+---------+           Findings reported to Dr. Allyson Sabal via Epic messaging at 10:39 am.   Summary:  Right Carotid: Velocities in the right ICA are consistent with a 1-39%  stenosis.                 Non-hemodynamically significant plaque <50% noted in the  CCA.   Left Carotid: Velocities in the left ICA are consistent with a 80-99%  stenosis.               Non-hemodynamically significant plaque <50% noted in the  CCA. The                ECA appears >50% stenosed.   Vertebrals:  Bilateral vertebral arteries demonstrate antegrade flow.  Small              caliber right vertebral artery.  Subclavians: Right subclavian artery was stenotic. Bilateral subclavian  artery              flow was  disturbed.    CTA IMPRESSION: 1. Bulky calcified plaque at the left carotid bifurcation resulting in high-grade stenosis of the proximal internal carotid artery. 2. Calcified plaque at the right carotid bifurcation resulting in approximately 40-50% stenosis of the proximal internal carotid artery. 3. Severe right and mild left vertebral artery origin stenosis. The left vertebral artery is dominant.      Assessment/Plan:    77 year old male with high-grade left ICA stenosis confirmed with CT angio.  We reviewed his images together at bedside today which demonstrates a bulky calcified plaque right at the ICA bifurcation left.  This has known elevated velocities by duplex.  We discussed his options of medical therapy with the addition of Plavix to aspirin and statin versus stenting versus carotid endarterectomy.  Given the calcific nature I have recommended against any type of stenting either transfemoral or transcarotid.  Plan is to proceed with L CEA today in the OR. Again reiterated the risks and benefits particularly risk of stroke and need for neuro checks on awakening from anesthesia. His wife is at home and I will contact after surgery.      Bishoy Cupp C. Randie Heinz, MD [facility] Office: [phone] Pager: [phone]

## 2023-03-27 NOTE — Op Note (Addendum)
Patient name: Glenn Collins MRN: [id number] DOB: March 17, 1946 Sex: male  03/27/2023 Pre-operative Diagnosis: Asymptomatic high-grade left ICA stenosis Post-operative diagnosis:  Same Surgeon:  Apolinar Junes C. Randie Heinz, MD Assistant: Nathanial Rancher, PA Procedure Performed: Left carotid endarterectomy with bovine pericardial patch angioplasty  Indications: 77 year old male with history of high-grade left ICA stenosis which is remained asymptomatic.  He has CT evidence of significant calcific lesion not amenable to transfemoral transcarotid stenting we have discussed his options being medical therapy versus carotid endarterectomy he has elected for surgical intervention for which he presents today.  All risk benefits and alternatives have been discussed with the patient he demonstrates good understanding and consent was signed and he wishes to proceed.   Given the complexity of the case,  the assistant was necessary in order to expedite the procedure and safely perform the technical aspects of the operation.  The assistant provided traction and countertraction to assist with exposure of the common carotid artery, external carotid artery, and internal carotid artery.  They also assisted with suture ligatures and dividing the facial vein and multiple small venous branches tethering the hypoglossal nerve. In addition they were necessary to provide adequate traction and countertraction to perform a precise endarterectomy and precise closure.  These skills, especially following the Prolene suture for the anastomosis, could not have been adequately performed by a scrub tech assistant.    Findings: There was a large friable calcified plaque at the carotid bifurcation extending approximately 2 cm cephalad.  After endarterectomy we placed 4 tacking sutures distally and there was very strong backbleeding from the ICA distally and no shunt was placed.  At completion there was strong signal in the distal ICA with flow  throughout diastole the patient was neurologically intact upon awakening from anesthesia.   Procedure:  The patient was identified in the holding area and taken to the operating room where he was placed supine on upper table general anesthesia was induced.  He was sterilely prepped and draped in the left neck in the usual fashion, antibiotics were administered a timeout was called.  Ultrasound was used to identify the carotid bifurcation incision was created along the anterior border sternocleidomastoid we dissected down to the skin and subcutaneous tissue identified the platysma and this was divided.  Proximally we retracted the sternocleidomastoid laterally dissected down retracted the IJ laterally protected the vagus nerve identified the common carotid artery and encircled this with umbilical tape and the patient was fully heparinized and ACT ultimately returned greater than 250.  We dissected higher up identified the facial vein and this was divided between ties.  The hypoglossal nerve was identified and protected.  Superior thyroidal and external carotid artery branches were encircled with Vesseloops.  Higher on the ICA where this appeared normal we encircled this with a vessel loop as well and again the hypoglossal nerve was protected and the ansa cervicalis was identified and also protected.  A 12 French shunt and bovine pericardial patch were prepared.  The ICA was then clamped distally followed by the common carotid artery and then the external carotid artery and the common carotid artery onto the ICA was opened until was normal-appearing.  There was very strong backbleeding from the distal ICA and thus no shunt was placed.  We proceeded with endarterectomy and after this we placed 4 tacking sutures distally at the endarterectomy site.  When endarterectomy was complete we then sewed a bovine pericardial patch in place.  Prior to completion we allowed flushing in all directions.  And then thoroughly  irrigated the carotid bulb.  We then completed the patch and then released the clamps on the ECA followed by the common carotid artery and after several cardiac cycles the ICA.  The ICA had very strong flow throughout diastole is an expected signal.  50 mg of protamine was administered.  We then meticulously obtained hemostasis of the wound and thoroughly irrigated the wound and closed in layers the platysma with Vicryl and the skin with Monocryl.  Dermabond was placed at the skin level.  The patient was then awakened from anesthesia having tolerated the procedure well intermittent complication.  All counts were correct at completion.  EBL: 200 cc   [name] C. Randie Heinz, MD [facility] Office: [phone] Pager: [phone]

## 2023-03-28 ENCOUNTER — Encounter (HOSPITAL_COMMUNITY): Payer: Self-pay | Admitting: Vascular Surgery

## 2023-03-28 LAB — CUP PACEART REMOTE DEVICE CHECK
Date Time Interrogation Session: 20241210080943
Implantable Lead Connection Status: 753985
Implantable Lead Connection Status: 753985
Implantable Lead Implant Date: 20210614
Implantable Lead Implant Date: 20210614
Implantable Lead Location: 753859
Implantable Lead Location: 753860
Implantable Lead Model: 377171
Implantable Lead Model: 377171
Implantable Lead Serial Number: 7000244971
Implantable Lead Serial Number: 7000271886
Implantable Pulse Generator Implant Date: 20210614
Pulse Gen Model: 407145
Pulse Gen Serial Number: 69870190

## 2023-03-28 LAB — BASIC METABOLIC PANEL
Anion gap: 9 (ref 5–15)
BUN: 12 mg/dL (ref 8–23)
CO2: 26 mmol/L (ref 22–32)
Calcium: 9 mg/dL (ref 8.9–10.3)
Chloride: 95 mmol/L — ABNORMAL LOW (ref 98–111)
Creatinine, Ser: 0.86 mg/dL (ref 0.61–1.24)
GFR, Estimated: >60 mL/min (ref >60)
Glucose, Bld: 134 mg/dL — ABNORMAL HIGH (ref 70–99)
Potassium: 2.9 mmol/L — ABNORMAL LOW (ref 3.5–5.1)
Sodium: 130 mmol/L — ABNORMAL LOW (ref 135–145)

## 2023-03-28 LAB — LIPID PANEL
Cholesterol: 141 mg/dL (ref 0–200)
HDL: 59 mg/dL (ref >40)
LDL Cholesterol: 73 mg/dL (ref 0–99)
Total CHOL/HDL Ratio: 2.4 {ratio}
Triglycerides: 45 mg/dL (ref <150)
VLDL: 9 mg/dL (ref 0–40)

## 2023-03-28 LAB — CBC
HCT: 32 % — ABNORMAL LOW (ref 39.0–52.0)
Hemoglobin: 11.3 g/dL — ABNORMAL LOW (ref 13.0–17.0)
MCH: 31.7 pg (ref 26.0–34.0)
MCHC: 35.3 g/dL (ref 30.0–36.0)
MCV: 89.6 fL (ref 80.0–100.0)
Platelets: 226 10*3/uL (ref 150–400)
RBC: 3.57 MIL/uL — ABNORMAL LOW (ref 4.22–5.81)
RDW: 13.5 % (ref 11.5–15.5)
WBC: 10.3 10*3/uL (ref 4.0–10.5)
nRBC: 0 % (ref 0.0–0.2)

## 2023-03-28 MED ORDER — OXYCODONE-ACETAMINOPHEN 5-325 MG PO TABS: 1.0000 | ORAL_TABLET | Freq: Four times a day (QID) | ORAL | 0 refills | Status: DC | PRN

## 2023-03-28 NOTE — Discharge Summary (Signed)
Discharge Summary     Glenn Collins 11-Jul-1945 77 y.o. male  [id number]  Admission Date: 03/27/2023  Discharge Date: 03/28/2023  Physician: Juventino Slovak*  Admission Diagnosis: Carotid artery stenosis [I65.29] Carotid stenosis, asymptomatic, left [I65.22]   HPI:   This is a 77 y.o. male with history of high-grade left ICA stenosis which is remained asymptomatic. He has CT evidence of significant calcific lesion not amenable to transfemoral transcarotid stenting we have discussed his options being medical therapy versus carotid endarterectomy he has elected for surgical intervention for which he presents today. All risk benefits and alternatives have been discussed with the patient he demonstrates good understanding and consent was signed and he wishes to proceed.   Hospital Course:  The patient was admitted to the hospital and taken to the operating room on 03/27/2023 and underwent left CEA    Findings: There was a large friable calcified plaque at the carotid bifurcation extending approximately 2 cm cephalad. After endarterectomy we placed 4 tacking sutures distally and there was very strong backbleeding from the ICA distally and no shunt was placed. At completion there was strong signal in the distal ICA with flow throughout diastole the patient was neurologically intact upon awakening from anesthesia.   The pt tolerated the procedure well and was transported to the PACU in good condition.   By POD 1, the pt neuro status was in tact.  He was swallowing without difficulty and ambulating.  He was able to walk in the hallways.  He did have hypokalemia and this was supplemented.  His renal function is normal.  Incision looks good without hematoma.   Recent Labs    03/28/23 0340  NA 130*  K 2.9*  CL 95*  CO2 26  GLUCOSE 134*  BUN 12  CALCIUM 9.0   Recent Labs    03/27/23 1608 03/28/23 0340  WBC 9.1 10.3  HGB 11.8* 11.3*  HCT 33.5* 32.0*  PLT 211 226    No results for input(s): "INR" in the last 72 hours.   Discharge Instructions     Discharge patient   Complete by: As directed    Discharge pt home after he has walked in hallways.  Thanks.   Discharge disposition: 01-Home or Self Care   Discharge patient date: 03/28/2023       Discharge Diagnosis:  Carotid artery stenosis [I65.29] Carotid stenosis, asymptomatic, left [I65.22]  Secondary Diagnosis: Patient Active Problem List   Diagnosis Date Noted   Carotid artery stenosis 03/27/2023   Carotid stenosis, asymptomatic, left 03/27/2023   Abscess 03/15/2022   Environmental and seasonal allergies 01/10/2022   DVT (deep venous thrombosis) (HCC) 08/11/2021   Lower extremity edema 07/08/2021   Osteoarthritis of right hip 06/15/2021   Preoperative clearance 05/04/2021   Wellness examination 03/14/2021   Valvular heart disease 12/23/2020   Dyslipidemia 12/23/2020   Greater trochanteric pain syndrome of right lower extremity 12/23/2020   Stenosis of left subclavian artery (HCC) 04/02/2020   Aortic stenosis 04/02/2020   Pacemaker 01/05/2020   Complete heart block (HCC) 09/28/2019   Syncope 09/08/2019   Hyperlipidemia 04/22/2019   Bilateral carotid bruits 12/24/2018   PAD (peripheral artery disease) (HCC) 10/27/2018   Hypertension 10/27/2018   CAD (coronary artery disease) 10/27/2018   NSTEMI (non-ST elevated myocardial infarction) 10/27/2018   Acute back pain 10/27/2018   Past Medical History:  Diagnosis Date   Arthritis    CAD (coronary artery disease) 10/27/2018   Carotid artery occlusion    Coronary artery disease  Heart murmur Since childhood   History of kidney stones 2021   Hypertension    PAD (peripheral artery disease) (HCC) 10/27/2018   Presence of permanent cardiac pacemaker    Biotronik PPM    Allergies as of 03/28/2023   No Known Allergies      Medication List     TAKE these medications    aspirin EC 81 MG tablet Take 81 mg by mouth daily.  Swallow whole.   atorvastatin 80 MG tablet Commonly known as: LIPITOR TAKE ONE TABLET BY MOUTH DAILY AT 6PM.   chlorthalidone 25 MG tablet Commonly known as: HYGROTON Take 1 tablet (25 mg total) by mouth daily.   diphenhydrAMINE 25 MG tablet Commonly known as: BENADRYL Take 25 mg by mouth at bedtime.   ezetimibe 10 MG tablet Commonly known as: ZETIA Take 1 tablet (10 mg total) by mouth daily.   metoprolol succinate 25 MG 24 hr tablet Commonly known as: TOPROL-XL Take 1 tablet (25 mg total) by mouth daily.   multivitamin with minerals Tabs tablet Take 1 tablet by mouth daily.   nitroGLYCERIN 0.4 MG SL tablet Commonly known as: NITROSTAT Place 1 tablet (0.4 mg total) under the tongue every 5 (five) minutes x 3 doses as needed for chest pain.   oxyCODONE-acetaminophen 5-325 MG tablet Commonly known as: Percocet Take 1 tablet by mouth every 6 (six) hours as needed for severe pain (pain score 7-10).   sodium chloride 0.65 % Soln nasal spray Commonly known as: OCEAN Place 1 spray into both nostrils as needed for congestion.         [facility] Discharge Instructions Carotid Endarterectomy (CEA)  Please refer to the following instructions for your post-procedure care. Your surgeon or physician assistant will discuss any changes with you.  Activity  You are encouraged to walk as much as you can. You can slowly return to normal activities but must avoid strenuous activity and heavy lifting until your doctor tell you it's OK. Avoid activities such as vacuuming or swinging a golf club. You can drive after one week if you are comfortable and you are no longer taking prescription pain medications. It is normal to feel tired for serval weeks after your surgery. It is also normal to have difficulty with sleep habits, eating, and bowel movements after surgery. These will go away with time.  Bathing/Showering  You may shower after you come home. Do  not soak in a bathtub, hot tub, or swim until the incision heals completely.  Incision Care  Shower every day. Clean your incision with mild soap and water. Pat the area dry with a clean towel. You do not need a bandage unless otherwise instructed. Do not apply any ointments or creams to your incision. You may have skin glue on your incision. Do not peel it off. It will come off on its own in about one week. Your incision may feel thickened and raised for several weeks after your surgery. This is normal and the skin will soften over time. For Men Only: It's OK to shave around the incision but do not shave the incision itself for 2 weeks. It is common to have numbness under your chin that could last for several months.  Diet  Resume your normal diet. There are no special food restrictions following this procedure. A low fat/low cholesterol diet is recommended for all patients with vascular disease. In order to heal from your surgery, it is CRITICAL to get adequate nutrition.  Your body requires vitamins, minerals, and protein. Vegetables are the best source of vitamins and minerals. Vegetables also provide the perfect balance of protein. Processed food has little nutritional value, so try to avoid this.  Medications  Resume taking all of your medications unless your doctor or physician assistant tells you not to.  If your incision is causing pain, you may take over-the- counter pain relievers such as acetaminophen (Tylenol). If you were prescribed a stronger pain medication, please be aware these medications can cause nausea and constipation.  Prevent nausea by taking the medication with a snack or meal. Avoid constipation by drinking plenty of fluids and eating foods with a high amount of fiber, such as fruits, vegetables, and grains.  Do not take Tylenol if you are taking prescription pain medications.  Follow Up  Our office will schedule a follow up appointment 2-3 weeks following  discharge.  Please call us immediately for any of the following conditions  Increased pain, redness, drainage (pus) from your incision site. Fever of 101 degrees or higher. If you should develop stroke (slurred speech, difficulty swallowing, weakness on one side of your body, loss of vision) you should call 911 and go to the nearest emergency room.  Reduce your risk of vascular disease:  Stop smoking. If you would like help call [facility] at 1-800-QUIT-NOW (2497106901) or [facility] at [phone]. Manage your cholesterol Maintain a desired weight Control your diabetes Keep your blood pressure down  If you have any questions, please call the office at [phone].  Prescriptions given: 1.   Roxicet #8 No Refill  Disposition: home  Patient's condition: is Good  Follow up: 1. [facility] in 2-3 weeks on Dr. Randie Heinz clinic day   Doreatha Massed, PA-C [facility] [phone]   --- For [facility] Registry use ---   Modified Rankin score at D/C (0-6): 0  IV medication needed for:  1. Hypertension: No 2. Hypotension: No  Post-op Complications: No  1. Post-op CVA or TIA: No  If yes: Event classification (right eye, left eye, right cortical, left cortical, verterobasilar, other): n/a  If yes: Timing of event (intra-op, <6 hrs post-op, >=6 hrs post-op, unknown): n/a  2. CN injury: No  If yes: CN n/a injuried   3. Myocardial infarction: No  If yes: Dx by (EKG or clinical, Troponin): n/a  4.  CHF: No  5.  Dysrhythmia (new): No  6. Wound infection: No  7. Reperfusion symptoms: No  8. Return to OR: No  If yes: return to OR for (bleeding, neurologic, other CEA incision, other): n/a  Discharge medications: Statin use:  Yes ASA use:  Yes   Beta blocker use:  Yes ACE-Inhibitor use:  No  ARB use:  No CCB use: No P2Y12 Antagonist use: No, [ ]  Plavix, [ ]  Plasugrel, [ ]  Ticlopinine, [ ]  Ticagrelor, [ ]  Other, [ ]  No for medical reason, [ ]   Non-compliant, [ ]  Not-indicated Anti-coagulant use:  No, [ ]  Warfarin, [ ]  Rivaroxaban, [ ]  Dabigatran,

## 2023-03-28 NOTE — Progress Notes (Addendum)
  Progress Note    03/28/2023 6:40 AM 1 Day Post-Op  Subjective:  feels good.  Throat sore but no difficulty swallowing.  Has voided and walked to the bathroom  Afebrile HR 60's-90's  120's-140's systolic 93% RA  Gtts:  none  Vitals:   03/27/23 2314 03/28/23 0340  BP: 126/65 133/66  Pulse: 74 77  Resp: 20 18  Temp: 98 F (36.7 C) 98.2 F (36.8 C)  SpO2: 95% 93%     Physical Exam: Neuro:  in tact Lungs:  non labored Incision:  clean and dry without hematoma  CBC    Component Value Date/Time   WBC 10.3 03/28/2023 0340   RBC 3.57 (L) 03/28/2023 0340   HGB 11.3 (L) 03/28/2023 0340   HGB 13.0 05/09/2022 0933   HCT 32.0 (L) 03/28/2023 0340   HCT 39.2 05/09/2022 0933   PLT 226 03/28/2023 0340   PLT 229 05/09/2022 0933   MCV 89.6 03/28/2023 0340   MCV 97 05/09/2022 0933   MCH 31.7 03/28/2023 0340   MCHC 35.3 03/28/2023 0340   RDW 13.5 03/28/2023 0340   RDW 13.4 05/09/2022 0933   LYMPHSABS 1.3 05/09/2022 0933   MONOABS 0.9 09/22/2019 0215   EOSABS 0.1 05/09/2022 0933   BASOSABS 0.1 05/09/2022 0933    BMET    Component Value Date/Time   NA 130 (L) 03/28/2023 0340   NA 133 (L) 02/01/2023 1038   K 2.9 (L) 03/28/2023 0340   CL 95 (L) 03/28/2023 0340   CO2 26 03/28/2023 0340   GLUCOSE 134 (H) 03/28/2023 0340   BUN 12 03/28/2023 0340   BUN 19 02/01/2023 1038   CREATININE 0.86 03/28/2023 0340   CALCIUM 9.0 03/28/2023 0340   GFRNONAA >60 03/28/2023 0340   GFRAA >60 10/06/2019 1921     Intake/Output Summary (Last 24 hours) at 03/28/2023 0640 Last data filed at 03/28/2023 0416 Gross per 24 hour  Intake 1965.13 ml  Output 880 ml  Net 1085.13 ml     Assessment/Plan:  This is a 77 y.o. male who is s/p left CEA for asymptomatic carotid artery stenosis  1 Day Post-Op  -pt is doing well this am. -hypokalemia this am-supplement -pt neuro exam is in tact -pt has ambulated to the bathroom -pt has voided -f/u with [facility] in 2-3 weeks on Dr. Randie Heinz clinic day.   Our office will arrange appt.  -will send pain med to [facility] pharmacy but does not need to pick up if he does not need it.    Doreatha Massed, PA-C [facility] [phone]   I have independently interviewed and examined patient and agree with PA assessment and plan above.  Recovering well.  Okay for discharge and will follow-up in a few weeks for wound check.  Travonta Gill C. Randie Heinz, MD [facility] [place] Office: [phone] Pager: [phone]

## 2023-03-28 NOTE — Progress Notes (Signed)
Mobility Specialist Progress Note:   03/28/23 1000  Mobility  Activity Ambulated with assistance in hallway  Level of Assistance Standby assist, set-up cues, supervision of patient - no hands on  Assistive Device None  Distance Ambulated (ft) 200 ft  Activity Response Tolerated well  Mobility Referral Yes  Mobility visit 1 Mobility  Mobility Specialist Start Time (ACUTE ONLY) 0953  Mobility Specialist Stop Time (ACUTE ONLY) 1003  Mobility Specialist Time Calculation (min) (ACUTE ONLY) 10 min   During Mobility: 113 HR , 91% SpO2  Pt received in chair, agreeable to mobility per RN request. No physical assist needed during ambulation. Pt c/o slight lightheadedness, otherwise asx throughout. VSS. Pt returned to chair with all needs met.   Leory Plowman  Mobility Specialist Please contact via [employer] office at [phone]

## 2023-03-28 NOTE — TOC Transition Note (Signed)
Transition of Care (TOC) - CM/SW Discharge Note Donn Pierini RN, BSN Transitions of Care Unit 4E- RN Case Manager See Treatment Team for direct phone #   Patient Details  Name: Glenn Collins MRN: [id number] Date of Birth: 1945/07/13  [facility]) CM/SW Contact:  Darrold Span, RN Phone Number: 03/28/2023, 11:45 AM   Clinical Narrative:    Pt stable for transition home today, CM was notified by [facility] liaison that VSS office made referral for [facility] needs- [facility] liaison notified of discharge and will follow up for [facility] needs.   Family to transport home,  No further TOC needs noted.    Final next level of care: Home w Home Health Services Barriers to Discharge: No Barriers Identified   Patient Goals and CMS Choice   Choice offered to / list presented to : NA  Discharge Placement                 Home w/ [facility]        Discharge Plan and Services Additional resources added to the After Visit Summary for   In-house Referral: NA Discharge Planning Services: NA            DME Arranged: N/A DME Agency: NA         HH Agency: [facility] Date HH Agency Contacted: 03/28/23   Representative spoke with at [facility] Agency: Corrie Dandy  Social Determinants of Health (SDOH) Interventions SDOH Screenings   Food Insecurity: Patient Declined (03/28/2023)  Housing: Patient Declined (03/28/2023)  Transportation Needs: Patient Declined (03/28/2023)  Utilities: Patient Declined (03/28/2023)  Alcohol Screen: Low Risk  (01/31/2023)  Depression (PHQ2-9): Low Risk  (01/31/2023)  Financial Resource Strain: Low Risk  (01/31/2023)  Physical Activity: Insufficiently Active (01/31/2023)  Social Connections: Socially Isolated (01/31/2023)  Stress: No Stress Concern Present (01/31/2023)  Tobacco Use: Medium Risk (03/27/2023)  Health Literacy: Adequate Health Literacy (01/31/2023)     Readmission Risk Interventions    03/28/2023   11:45 AM   Readmission Risk Prevention Plan  Post Dischage Appt Complete  Medication Screening Complete  Transportation Screening Complete

## 2023-03-28 NOTE — Discharge Instructions (Signed)
   [facility] [place]  Discharge Instructions   Carotid Surgery  Please refer to the following instructions for your post-procedure care. Your surgeon or physician assistant will discuss any changes with you.  Activity  You are encouraged to walk as much as you can. You can slowly return to normal activities but must avoid strenuous activity and heavy lifting until your doctor tell you it's okay. Avoid activities such as vacuuming or swinging a golf club. You can drive after one week if you are comfortable and you are no longer taking prescription pain medications. It is normal to feel tired for serval weeks after your surgery. It is also normal to have difficulty with sleep habits, eating, and bowel movements after surgery. These will go away with time.  Bathing/Showering  Shower daily after you go home. Do not soak in a bathtub, hot tub, or swim until the incision heals completely.  Incision Care  Shower every day. Clean your incision with mild soap and water. Pat the area dry with a clean towel. You do not need a bandage unless otherwise instructed. Do not apply any ointments or creams to your incision. You may have skin glue on your incision. Do not peel it off. It will come off on its own in about one week. Your incision may feel thickened and raised for several weeks after your surgery. This is normal and the skin will soften over time.   For Men Only: It's okay to shave around the incision but do not shave the incision itself for 2 weeks. It is common to have numbness under your chin that could last for several months.  Diet  Resume your normal diet. There are no special food restrictions following this procedure. A low fat/low cholesterol diet is recommended for all patients with vascular disease. In order to heal from your surgery, it is CRITICAL to get adequate nutrition. Your body requires vitamins, minerals, and protein. Vegetables are the best source of  vitamins and minerals. Vegetables also provide the perfect balance of protein. Processed food has little nutritional value, so try to avoid this.  Medications  Resume taking all of your medications unless your doctor or physician assistant tells you not to. If your incision is causing pain, you may take over-the- counter pain relievers such as acetaminophen (Tylenol). If you were prescribed a stronger pain medication, please be aware these medications can cause nausea and constipation. Prevent nausea by taking the medication with a snack or meal. Avoid constipation by drinking plenty of fluids and eating foods with a high amount of fiber, such as fruits, vegetables, and grains.   Do not take Tylenol if you are taking prescription pain medications.  Follow Up  Our office will schedule a follow up appointment 2-3 weeks following discharge.  Please call us immediately for any of the following conditions  . Increased pain, redness, drainage (pus) from your incision site. . Fever of 101 degrees or higher. . If you should develop stroke (slurred speech, difficulty swallowing, weakness on one side of your body, loss of vision) you should call 911 and go to the nearest emergency room. .  Reduce your risk of vascular disease:  . Stop smoking. If you would like help call [facility] at 1-800-QUIT-NOW (1-800-784-8669) or [facility] at 336-586-4000. . Manage your cholesterol . Maintain a desired weight . Control your diabetes . Keep your blood pressure down .  If you have any questions, please call the office at [phone]. 

## 2023-03-28 NOTE — Plan of Care (Signed)
  Problem: Health Behavior/Discharge Planning: Goal: Ability to manage health-related needs will improve Outcome: Progressing   Problem: Clinical Measurements: Goal: Respiratory complications will improve Outcome: Progressing Goal: Cardiovascular complication will be avoided Outcome: Progressing

## 2023-03-29 ENCOUNTER — Ambulatory Visit (INDEPENDENT_AMBULATORY_CARE_PROVIDER_SITE_OTHER): Payer: Medicare Other

## 2023-03-29 ENCOUNTER — Telehealth (HOSPITAL_BASED_OUTPATIENT_CLINIC_OR_DEPARTMENT_OTHER): Payer: Self-pay

## 2023-03-29 DIAGNOSIS — I442 Atrioventricular block, complete: Secondary | ICD-10-CM

## 2023-03-29 NOTE — Transitions of Care (Post Inpatient/ED Visit) (Signed)
   03/29/2023  Name: Kaysan Moleski MRN: [id number] DOB: 07-30-1945  Today's TOC FU Call Status: Today's TOC FU Call Status:: Unsuccessful Call (1st Attempt) Unsuccessful Call (1st Attempt) Date: 03/29/23  Attempted to reach the patient regarding the most recent Inpatient/ED visit.  Follow Up Plan: Additional outreach attempts will be made to reach the patient to complete the Transitions of Care (Post Inpatient/ED visit) call.   Signature Karena Addison, LPN [facility] Nurse Health Advisor Direct Dial [phone]

## 2023-03-29 NOTE — Anesthesia Postprocedure Evaluation (Signed)
Anesthesia Post Note  Patient: Glenn Collins  Procedure(s) Performed: ENDARTERECTOMY CAROTID (Left) XENOSURE PATCH ANGIOPLASTY (Left: Neck)     Patient location during evaluation: PACU Anesthesia Type: General Level of consciousness: awake and alert Pain management: pain level controlled Vital Signs Assessment: post-procedure vital signs reviewed and stable Respiratory status: spontaneous breathing, nonlabored ventilation, respiratory function stable and patient connected to nasal cannula oxygen Cardiovascular status: blood pressure returned to baseline and stable Postop Assessment: no apparent nausea or vomiting Anesthetic complications: no   There were no known notable events for this encounter.  Last Vitals:  Vitals:   03/28/23 0340 03/28/23 0756  BP: 133/66 (!) 153/76  Pulse: 77 85  Resp: 18 16  Temp: 36.8 C 36.8 C  SpO2: 93% 96%    Last Pain:  Vitals:   03/28/23 0756  TempSrc: Oral  PainSc: 0-No pain                 Ramya Vanbergen S

## 2023-04-03 NOTE — Transitions of Care (Post Inpatient/ED Visit) (Signed)
04/03/2023  Name: Glenn Collins MRN: [id number] DOB: [birth year]  Today's TOC FU Call Status: Today's TOC FU Call Status:: Successful TOC FU Call Completed Unsuccessful Call (1st Attempt) Date: 03/29/23 [facility] FU Call Complete Date: 04/03/23 Patient's Name and Date of Birth confirmed.  Transition Care Management Follow-up Telephone Call Date of Discharge: 03/28/23 Discharge Facility: [facility]) Type of Discharge: Inpatient Admission Primary Inpatient Discharge Diagnosis:: carotid stenosis How have you been since you were released from the hospital?: Better Any questions or concerns?: No  Items Reviewed: Did you receive and understand the discharge instructions provided?: Yes Medications obtained,verified, and reconciled?: Yes (Medications Reviewed) Any new allergies since your discharge?: No Dietary orders reviewed?: Yes Do you have support at home?: Yes People in Home: spouse  Medications Reviewed Today: Medications Reviewed Today     Reviewed by Karena Addison, LPN (Licensed Practical Nurse) on 04/03/23 at 1523  Med List Status: <None>   Medication Order Taking? Sig Documenting Provider Last Dose Status Informant  aspirin EC 81 MG tablet [id number] No Take 81 mg by mouth daily. Swallow whole. [provider] Past Week Active Self  atorvastatin (LIPITOR) 80 MG tablet [id number] No TAKE ONE TABLET BY MOUTH DAILY AT Estill Bakes, MD 03/26/2023 Active Self  chlorthalidone (HYGROTON) 25 MG tablet [id number] No Take 1 tablet (25 mg total) by mouth daily. Runell Gess, MD 03/27/2023 0430 Active Self  diphenhydrAMINE (BENADRYL) 25 MG tablet [id number] No Take 25 mg by mouth at bedtime. [provider] Past Week Active Self  ezetimibe (ZETIA) 10 MG tablet [id number] No Take 1 tablet (10 mg total) by mouth daily. Runell Gess, MD 03/27/2023 0430 Active Self  metoprolol succinate (TOPROL-XL) 25 MG 24 hr tablet [id number] No Take 1 tablet (25 mg total)  by mouth daily. Runell Gess, MD 03/27/2023 0430 Active Self  Multiple Vitamin (MULTIVITAMIN WITH MINERALS) TABS tablet [id number] No Take 1 tablet by mouth daily. [provider] Past Week Active Self  nitroGLYCERIN (NITROSTAT) 0.4 MG SL tablet [id number] No Place 1 tablet (0.4 mg total) under the tongue every 5 (five) minutes x 3 doses as needed for chest pain. Arty Baumgartner, NP Taking Active Self           Med Note Gunnar Fusi, MELISSA R   Wed Mar 21, 2023 11:03 AM)    oxyCODONE-acetaminophen (PERCOCET) 5-325 MG tablet [id number]  Take 1 tablet by mouth every 6 (six) hours as needed for severe pain (pain score 7-10). Rhyne, Ames Coupe, PA-C  Active   sodium chloride (OCEAN) 0.65 % SOLN nasal spray [id number] No Place 1 spray into both nostrils as needed for congestion. [provider] Past Week Active Self            Home Care and Equipment/Supplies: Were Home Health Services Ordered?: Yes [facility] of [facility] Has Agency set up a time to come to your home?: Yes First Home Health Visit Date: 04/03/23 Any new equipment or medical supplies ordered?: NA  Functional Questionnaire: Do you need assistance with bathing/showering or dressing?: No Do you need assistance with meal preparation?: No Do you need assistance with eating?: No Do you have difficulty maintaining continence: No Do you need assistance with getting out of bed/getting out of a chair/moving?: No Do you have difficulty managing or taking your medications?: No  Follow up appointments reviewed: PCP Follow-up appointment confirmed?: NA Specialist Hospital Follow-up appointment confirmed?: Yes Date of Specialist follow-up appointment?: 04/09/23 Follow-Up Specialty Provider:: surgeon  Do you need transportation to your follow-up appointment?: No Do you understand care options if your condition(s) worsen?: Yes-patient verbalized understanding    SIGNATURE Karena Addison, LPN [facility]  Nurse Health Advisor Direct Dial [phone]

## 2023-05-02 ENCOUNTER — Ambulatory Visit (INDEPENDENT_AMBULATORY_CARE_PROVIDER_SITE_OTHER): Payer: Medicare Other | Admitting: Physician Assistant

## 2023-05-02 ENCOUNTER — Encounter: Payer: Self-pay | Admitting: Physician Assistant

## 2023-05-02 VITALS — BP 98/68 | HR 97 | Temp 97.6°F | Resp 18 | Ht 71.0 in | Wt 207.2 lb

## 2023-05-02 DIAGNOSIS — I6523 Occlusion and stenosis of bilateral carotid arteries: Secondary | ICD-10-CM

## 2023-05-02 NOTE — Progress Notes (Signed)
  POST OPERATIVE OFFICE NOTE    CC:  F/u for surgery  HPI:  This is a 78 y.o. male who is s/p left CEA on 03/27/2023 by Dr. Vikki Graves for asymptomatic carotid artery stenosis.    Right ICA stenosis prior to surgery was 1-39%.  Pt returns today for follow up.  Pt states he has been doing well.  He denies any visual changes, speech difficulties or one sided weakness, numbness or paralysis.  He states he gets occasional lightheadedness but this was present before surgery and he has hx of inner ear issues and hay fever.  He has been compliant with his asa/statin.  His incision is healing nicely.  He inquires about lifting more than 5 lbs.   No Known Allergies  Current Outpatient Medications  Medication Sig Dispense Refill   aspirin  EC 81 MG tablet Take 81 mg by mouth daily. Swallow whole.     atorvastatin  (LIPITOR ) 80 MG tablet TAKE ONE TABLET BY MOUTH DAILY AT 6PM. 90 tablet 3   chlorthalidone  (HYGROTON ) 25 MG tablet Take 1 tablet (25 mg total) by mouth daily. 90 tablet 3   diphenhydrAMINE  (BENADRYL ) 25 MG tablet Take 25 mg by mouth at bedtime.     ezetimibe  (ZETIA ) 10 MG tablet Take 1 tablet (10 mg total) by mouth daily. 90 tablet 3   metoprolol  succinate (TOPROL -XL) 25 MG 24 hr tablet Take 1 tablet (25 mg total) by mouth daily. 30 tablet 11   Multiple Vitamin (MULTIVITAMIN WITH MINERALS) TABS tablet Take 1 tablet by mouth daily.     nitroGLYCERIN  (NITROSTAT ) 0.4 MG SL tablet Place 1 tablet (0.4 mg total) under the tongue every 5 (five) minutes x 3 doses as needed for chest pain. 25 tablet 1   oxyCODONE -acetaminophen  (PERCOCET) 5-325 MG tablet Take 1 tablet by mouth every 6 (six) hours as needed for severe pain (pain score 7-10). 8 tablet 0   sodium chloride  (OCEAN) 0.65 % SOLN nasal spray Place 1 spray into both nostrils as needed for congestion.     No current facility-administered medications for this visit.     ROS:  See HPI  Physical Exam:  Today's Vitals   05/02/23 1039 05/02/23  1042  BP: 120/74 98/68  Pulse: 97   Resp: 18   Temp: 97.6 F (36.4 C)   TempSrc: Temporal   SpO2: 99%   Weight: 207 lb 3.2 oz (94 kg)   Height: 5\' 11"  (1.803 m)    Body mass index is 28.9 kg/m.   Incision:  well healed Extremities:  moving all extremities equally.     Assessment/Plan:  This is a 78 y.o. male who is s/p: left CEA on 03/27/2023 by Dr. Vikki Graves for asymptomatic carotid artery stenosis.    -pt doing well without any neurological sx.   -discussed he can gradually build up his weight lifting over the next couple of weeks without restrictions. -continue asa/statin -f/u in 9 months for carotid duplex.  Discussed there is small risk of developing intimal hyperplasia in the first year.  He will see Dr. Vikki Graves at next visit since he did not see him today.  Discussed we will be following his carotid stenosis every year.  He knows that if he develops any neurological sx, he should go to the ER.    Maryanna Smart, [facility] [phone]   Clinic MD:  Vikki Graves

## 2023-05-04 NOTE — Progress Notes (Signed)
Remote pacemaker transmission.   

## 2023-05-29 ENCOUNTER — Other Ambulatory Visit: Payer: Self-pay

## 2023-05-29 DIAGNOSIS — I6523 Occlusion and stenosis of bilateral carotid arteries: Secondary | ICD-10-CM

## 2023-06-28 ENCOUNTER — Ambulatory Visit: Payer: Medicare Other

## 2023-06-28 ENCOUNTER — Encounter: Payer: Self-pay | Admitting: Internal Medicine

## 2023-06-28 DIAGNOSIS — I442 Atrioventricular block, complete: Secondary | ICD-10-CM

## 2023-06-28 LAB — CUP PACEART REMOTE DEVICE CHECK
Battery Voltage: 75
Date Time Interrogation Session: 20250313081441
Implantable Lead Connection Status: 753985
Implantable Lead Connection Status: 753985
Implantable Lead Implant Date: 20210614
Implantable Lead Implant Date: 20210614
Implantable Lead Location: 753859
Implantable Lead Location: 753860
Implantable Lead Model: 377171
Implantable Lead Model: 377171
Implantable Lead Serial Number: 7000244971
Implantable Lead Serial Number: 7000271886
Implantable Pulse Generator Implant Date: 20210614
Pulse Gen Model: 407145
Pulse Gen Serial Number: 69870190

## 2023-07-02 ENCOUNTER — Encounter: Payer: Self-pay | Admitting: Cardiovascular Disease

## 2023-07-02 ENCOUNTER — Ambulatory Visit: Payer: Medicare Other | Attending: Cardiovascular Disease | Admitting: Cardiovascular Disease

## 2023-07-02 VITALS — BP 150/82 | HR 89 | Ht 71.0 in | Wt 211.0 lb

## 2023-07-02 DIAGNOSIS — I10 Essential (primary) hypertension: Secondary | ICD-10-CM

## 2023-07-02 DIAGNOSIS — I6522 Occlusion and stenosis of left carotid artery: Secondary | ICD-10-CM

## 2023-07-02 DIAGNOSIS — I739 Peripheral vascular disease, unspecified: Secondary | ICD-10-CM | POA: Diagnosis not present

## 2023-07-02 DIAGNOSIS — E782 Mixed hyperlipidemia: Secondary | ICD-10-CM | POA: Diagnosis not present

## 2023-07-02 DIAGNOSIS — I2510 Atherosclerotic heart disease of native coronary artery without angina pectoris: Secondary | ICD-10-CM

## 2023-07-02 DIAGNOSIS — I350 Nonrheumatic aortic (valve) stenosis: Secondary | ICD-10-CM

## 2023-07-02 DIAGNOSIS — I442 Atrioventricular block, complete: Secondary | ICD-10-CM

## 2023-07-02 NOTE — Assessment & Plan Note (Signed)
 History of moderate aortic stenosis by 2D echo most recently performed 09/28/2022 with normal EF and a valve area of 1 cm with a peak gradient of 44 mmHg.  He has been asymptomatic.  Will continue to follow this on annual basis.

## 2023-07-02 NOTE — Assessment & Plan Note (Signed)
 History of essential hypertension with blood pressure measured today at 150/82.  He is on chlorthalidone and metoprolol.  He says usually his blood pressure at home is much better than this.

## 2023-07-02 NOTE — Assessment & Plan Note (Signed)
 History of high-grade left ICA stenosis status post recent elective left carotid endarterectomy performed by Dr. Randie Heinz 03/27/2023 which has healed.  He will continue to follow this.

## 2023-07-02 NOTE — Assessment & Plan Note (Signed)
 History of hyperlipidemia on high-dose atorvastatin and Zetia with lipid profile performed 03/28/2023 revealing total cholesterol 141, LDL 73 and HDL 59.

## 2023-07-02 NOTE — Assessment & Plan Note (Signed)
 History of CAD status post RCA and LAD stenting in 2007 with 3 interventions in 2017 of the RCA for in-stent restenosis.  He has been asymptomatic since.

## 2023-07-02 NOTE — Patient Instructions (Signed)
 Medication Instructions:  Your physician recommends that you continue on your current medications as directed. Please refer to the Current Medication list given to you today.  *If you need a refill on your cardiac medications before your next appointment, please call your pharmacy*   Testing/Procedures: Your physician has requested that you have an echocardiogram. Echocardiography is a painless test that uses sound waves to create images of your heart. It provides your doctor with information about the size and shape of your heart and how well your heart's chambers and valves are working. This procedure takes approximately one hour. There are no restrictions for this procedure. Please do NOT wear cologne, perfume, aftershave, or lotions (deodorant is allowed). Please arrive 15 minutes prior to your appointment time. **To do in June**  Please note: We ask at that you not bring children with you during ultrasound (echo/ vascular) testing. Due to room size and safety concerns, children are not allowed in the ultrasound rooms during exams. Our front office staff cannot provide observation of children in our lobby area while testing is being conducted. An adult accompanying a patient to their appointment will only be allowed in the ultrasound room at the discretion of the ultrasound technician under special circumstances. We apologize for any inconvenience.    Follow-Up: At [facility], you and your health needs are our priority.  As part of our continuing mission to provide you with exceptional heart care, we have created designated Provider Care Teams.  These Care Teams include your primary Cardiologist (physician) and Advanced Practice Providers (APPs -  Physician Assistants and Nurse Practitioners) who all work together to provide you with the care you need, when you need it.  We recommend signing up for the patient portal called "MyChart".  Sign up information is provided on this After  Visit Summary.  MyChart is used to connect with patients for Virtual Visits (Telemedicine).  Patients are able to view lab/test results, encounter notes, upcoming appointments, etc.  Non-urgent messages can be sent to your provider as well.   To learn more about what you can do with MyChart, go to [email or web address].    Your next appointment:   12 month(s)  Provider:   Nanetta Batty, MD     Other Instructions

## 2023-07-02 NOTE — Assessment & Plan Note (Signed)
 History of complete heart block status post permanent transvenous pacemaker insertion by Dr. Ladona Ridgel 09/29/2019.

## 2023-07-02 NOTE — Assessment & Plan Note (Signed)
 History of PAD with ABI performed 2021 revealing right ABI of 0.68 with monophasic waveforms.  His claudication did improve with the addition of Pletal which has been discontinued.

## 2023-07-02 NOTE — Progress Notes (Signed)
 07/02/2023 Glenn Collins   1946/03/14  [id number]  Primary Physician de [place], Raymond J, MD Primary Cardiologist: Runell Gess MD Nicholes Calamity, [place]  HPI:  Glenn Collins is a 78 y.o.  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    I last saw him in the office 01/02/2023.Marland Kitchen  He does have a history of CAD status post RCA and LAD stenting in 2007 with 3 intervention in 2017 of the RCA for in-stent restenosis.  History of hypertension and hyperlipidemia.  She never had a heart attack or stroke.  He denies chest pain or shortness of breath.  He smoked remotely but stopped 20 years ago.  He has a history of claudication which is fairly symmetric and lifestyle limiting especially walking up an incline.  He was evaluated in [place] and told he had "blockages".   I did begin him on Pletal which has resulted in significant improvement in his claudication symptoms.  He had carotid Dopplers that showed no evidence of ICA stenosis.   He apparently had several syncopal episodes and was noted to be bradycardic.  His beta-blocker was held but he continued to have bradycardia related syncope and ultimately underwent dual-chamber permanent transvenous pacemaker insertion by Dr. Ladona Ridgel 1 week ago.  His Pletal was discontinued at that time as the plus his beta-blocker.     Since I saw him in the 6 months ago he continues to do well.  He denies chest pain shortness of breath or claudication.  He did have elective left carotid endarterectomy performed Dr. Randie Heinz 03/27/2023.   Current Meds  Medication Sig   aspirin EC 81 MG tablet Take 81 mg by mouth daily. Swallow whole.   atorvastatin (LIPITOR) 80 MG tablet TAKE ONE TABLET BY MOUTH DAILY AT 6PM.   chlorthalidone (HYGROTON) 25 MG tablet Take 1 tablet  (25 mg total) by mouth daily.   diphenhydrAMINE (BENADRYL) 25 MG tablet Take 25 mg by mouth at bedtime.   ezetimibe (ZETIA) 10 MG tablet Take 1 tablet (10 mg total) by mouth daily.   fluorouracil (EFUDEX) 5 % cream Apply 1 application  topically as needed.   metoprolol succinate (TOPROL-XL) 25 MG 24 hr tablet Take 1 tablet (25 mg total) by mouth daily.   Multiple Vitamin (MULTIVITAMIN WITH MINERALS) TABS tablet Take 1 tablet by mouth daily.   nitroGLYCERIN (NITROSTAT) 0.4 MG SL tablet Place 1 tablet (0.4 mg total) under the tongue every 5 (five) minutes x 3 doses as needed for chest pain.   sodium chloride (OCEAN) 0.65 % SOLN nasal spray Place 1 spray into both nostrils as needed for congestion.     No Known Allergies  Social History   Socioeconomic History   Marital status: Married    Spouse name: Not on file   Number of children: Not on file   Years of education: Not on file   Highest education level: Doctorate  Occupational History   Not on file  Tobacco Use   Smoking status: Former    Current packs/day: 0.00    Average packs/day: 1.5 packs/day for 40.0 years (60.0 ttl pk-yrs)    Types: Cigarettes    Start date: 10/15/1960    Quit date: 10/15/2000    Years since quitting: 22.7   Smokeless tobacco: Never  Vaping Use  Vaping status: Never Used  Substance and Sexual Activity   Alcohol use: Yes    Alcohol/week: 28.0 standard drinks of alcohol    Types: 28 Glasses of wine per week    Comment: 4 glasses per day   Drug use: Never   Sexual activity: Not on file  Other Topics Concern   Not on file  Social History Narrative   Not on file   Social Drivers of Health   Financial Resource Strain: Low Risk  (01/31/2023)   Overall Financial Resource Strain (CARDIA)    Difficulty of Paying Living Expenses: Not hard at all  Food Insecurity: Patient Declined (03/28/2023)   Hunger Vital Sign    Worried About Running Out of Food in the Last Year: Patient declined    Ran Out of Food in  the Last Year: Patient declined  Transportation Needs: Patient Declined (03/28/2023)   PRAPARE - Administrator, Civil Service (Medical): Patient declined    Lack of Transportation (Non-Medical): Patient declined  Physical Activity: Insufficiently Active (01/31/2023)   Exercise Vital Sign    Days of Exercise per Week: 3 days    Minutes of Exercise per Session: 20 min  Stress: No Stress Concern Present (01/31/2023)   [employer] of Occupational Health - Occupational Stress Questionnaire    Feeling of Stress : Not at all  Social Connections: Socially Isolated (01/31/2023)   Social Connection and Isolation Panel [NHANES]    Frequency of Communication with Friends and Family: Once a week    Frequency of Social Gatherings with Friends and Family: Once a week    Attends Religious Services: Never    Database administrator or Organizations: No    Attends Banker Meetings: Never    Marital Status: Married  Catering manager Violence: Patient Declined (03/28/2023)   Humiliation, Afraid, Rape, and Kick questionnaire    Fear of Current or Ex-Partner: Patient declined    Emotionally Abused: Patient declined    Physically Abused: Patient declined    Sexually Abused: Patient declined     Review of Systems: General: negative for chills, fever, night sweats or weight changes.  Cardiovascular: negative for chest pain, dyspnea on exertion, edema, orthopnea, palpitations, paroxysmal nocturnal dyspnea or shortness of breath Dermatological: negative for rash Respiratory: negative for cough or wheezing Urologic: negative for hematuria Abdominal: negative for nausea, vomiting, diarrhea, bright red blood per rectum, melena, or hematemesis Neurologic: negative for visual changes, syncope, or dizziness All other systems reviewed and are otherwise negative except as noted above.    Blood pressure (!) 150/82, pulse 89, height 5\' 11"  (1.803 m), weight 211 lb (95.7 kg), SpO2  96%.  General appearance: alert and no distress Neck: no adenopathy, no JVD, supple, symmetrical, trachea midline, thyroid not enlarged, symmetric, no tenderness/mass/nodules, and bilateral carotid bruits versus transmitted murmur Lungs: clear to auscultation bilaterally Heart: 2/6 outflow tract murmur consistent with aortic stenosis Extremities: extremities normal, atraumatic, no cyanosis or edema Pulses: 2+ and symmetric Skin: Skin color, texture, turgor normal. No rashes or lesions Neurologic: Grossly normal  EKG  EKG Interpretation Date/Time:  Monday July 02 2023 11:44:00 EDT Ventricular Rate:  89 PR Interval:  162 QRS Duration:  190 QT Interval:  438 QTC Calculation: 532 R Axis:   -69  Text Interpretation: Atrial-sensed ventricular-paced rhythm When compared with ECG of 06-Oct-2019 18:52, Vent. rate has increased BY  10 BPM Confirmed by Nanetta Batty [phone]) on 07/02/2023 12:02:19 PM    ASSESSMENT AND PLAN:  PAD (peripheral artery disease) (HCC) History of PAD with ABI performed 2021 revealing right ABI of 0.68 with monophasic waveforms.  His claudication did improve with the addition of Pletal which has been discontinued.  Hypertension History of essential hypertension with blood pressure measured today at 150/82.  He is on chlorthalidone and metoprolol.  He says usually his blood pressure at home is much better than this.  CAD (coronary artery disease) History of CAD status post RCA and LAD stenting in 2007 with 3 interventions in 2017 of the RCA for in-stent restenosis.  He has been asymptomatic since.  Hyperlipidemia History of hyperlipidemia on high-dose atorvastatin and Zetia with lipid profile performed 03/28/2023 revealing total cholesterol 141, LDL 73 and HDL 59.  Complete heart block (HCC) History of complete heart block status post permanent transvenous pacemaker insertion by Dr. Ladona Ridgel 09/29/2019.  Aortic stenosis History of moderate aortic stenosis by 2D  echo most recently performed 09/28/2022 with normal EF and a valve area of 1 cm with a peak gradient of 44 mmHg.  He has been asymptomatic.  Will continue to follow this on annual basis.  Carotid stenosis, asymptomatic, left History of high-grade left ICA stenosis status post recent elective left carotid endarterectomy performed by Dr. Randie Heinz 03/27/2023 which has healed.  He will continue to follow this.     Runell Gess MD FACP,FACC,FAHA, [facility] 07/02/2023 12:02 PM

## 2023-07-10 ENCOUNTER — Ambulatory Visit (HOSPITAL_BASED_OUTPATIENT_CLINIC_OR_DEPARTMENT_OTHER): Admitting: Family Medicine

## 2023-07-10 ENCOUNTER — Encounter (HOSPITAL_BASED_OUTPATIENT_CLINIC_OR_DEPARTMENT_OTHER): Payer: Self-pay | Admitting: Family Medicine

## 2023-07-10 VITALS — BP 139/83 | HR 100 | Ht 71.0 in | Wt 210.9 lb

## 2023-07-10 DIAGNOSIS — M5432 Sciatica, left side: Secondary | ICD-10-CM | POA: Diagnosis not present

## 2023-07-10 NOTE — Progress Notes (Signed)
    Procedures performed today:    None.  Independent interpretation of notes and tests performed by another provider:   None.  Brief History, Exam, Impression, and Recommendations:    BP (!) 144/83 (BP Location: Right Arm, Patient Position: Sitting, Cuff Size: Normal)   Pulse 100   Ht 5\' 11"  (1.803 m)   Wt 210 lb 14.4 oz (95.7 kg)   SpO2 97%   BMI 29.41 kg/m   Sciatica of left side Assessment & Plan: Patient reports 1 week history of pain shooting down left leg.  No inciting event, no injury recalled.  Has not had any prior episodes like this.  Pain will occur with certain activities or movements.  Has noted with walking or bending a certain way.  In some cases, cannot reproduce pain, some days certain activities will trigger pain and other days not.  Has not had any associated weakness.  Denies any bowel or bladder issues, no associated numbness or tingling. On exam, patient presents with cane to assist with ambulation.  Normal gait in office, no observed weakness, no foot drop.  He is in no acute distress.  Vital signs are stable.  No tenderness to palpation through spinous processes and lumbar region, no tenderness palpation through paraspinal muscles in lumbar region.  Negative straight leg raise bilaterally.  Negative FABER and FADIR.  Negative logroll.  DTRs intact at bilateral patellar tendons and Achilles tendons. Discussed suspected diagnosis of sciatica.  Given lack of red flags, recommend that we proceed with conservative measures including referral to physical therapy and home exercise program.  Patient amenable to this.  We will plan for close follow-up in about 6 weeks to assess progress or sooner as needed.  Reviewed signs and symptoms for which patient should return to office sooner.  If not improving as expected, will likely proceed with x-ray imaging and consideration for MRI  Orders: -     Ambulatory referral to Physical Therapy  Return in about 6 weeks (around  08/21/2023) for sciatica.   ___________________________________________ Shawnice Tilmon de [place], MD, ABFM, CAQSM Primary Care and Sports Medicine [facility]

## 2023-07-10 NOTE — Patient Instructions (Signed)
  Medication Instructions:  Your physician recommends that you continue on your current medications as directed. Please refer to the Current Medication list given to you today. --If you need a refill on any your medications before your next appointment, please call your pharmacy first. If no refills are authorized on file call the office.--   Follow-Up: Your next appointment:   Your physician recommends that you schedule a follow-up appointment in: 6 week follow up  with Dr. de [place]  You will receive a text message or e-mail with a link to a survey about your care and experience with [place] today! We would greatly appreciate your feedback!   Thanks for letting us be apart of your health journey!!  Primary Care and Sports Medicine   Dr. Ceasar Mons [place]   We encourage you to activate your patient portal called "MyChart".  Sign up information is provided on this After Visit Summary.  MyChart is used to connect with patients for Virtual Visits (Telemedicine).  Patients are able to view lab/test results, encounter notes, upcoming appointments, etc.  Non-urgent messages can be sent to your provider as well. To learn more about what you can do with MyChart, please visit --  [email or web address].

## 2023-07-10 NOTE — Assessment & Plan Note (Signed)
 Patient reports 1 week history of pain shooting down left leg.  No inciting event, no injury recalled.  Has not had any prior episodes like this.  Pain will occur with certain activities or movements.  Has noted with walking or bending a certain way.  In some cases, cannot reproduce pain, some days certain activities will trigger pain and other days not.  Has not had any associated weakness.  Denies any bowel or bladder issues, no associated numbness or tingling. On exam, patient presents with cane to assist with ambulation.  Normal gait in office, no observed weakness, no foot drop.  He is in no acute distress.  Vital signs are stable.  No tenderness to palpation through spinous processes and lumbar region, no tenderness palpation through paraspinal muscles in lumbar region.  Negative straight leg raise bilaterally.  Negative FABER and FADIR.  Negative logroll.  DTRs intact at bilateral patellar tendons and Achilles tendons. Discussed suspected diagnosis of sciatica.  Given lack of red flags, recommend that we proceed with conservative measures including referral to physical therapy and home exercise program.  Patient amenable to this.  We will plan for close follow-up in about 6 weeks to assess progress or sooner as needed.  Reviewed signs and symptoms for which patient should return to office sooner.  If not improving as expected, will likely proceed with x-ray imaging and consideration for MRI

## 2023-08-01 ENCOUNTER — Ambulatory Visit (HOSPITAL_BASED_OUTPATIENT_CLINIC_OR_DEPARTMENT_OTHER): Payer: Medicare Other | Admitting: Family Medicine

## 2023-08-02 ENCOUNTER — Telehealth (HOSPITAL_BASED_OUTPATIENT_CLINIC_OR_DEPARTMENT_OTHER): Payer: Self-pay | Admitting: Physical Therapy

## 2023-08-02 NOTE — Telephone Encounter (Signed)
 Called and LVM to remind patient of upcoming physical therapy evaluation appointment. Requested call back to confirm appointment attendance.

## 2023-08-05 NOTE — Therapy (Signed)
 OUTPATIENT PHYSICAL THERAPY THORACOLUMBAR EVALUATION   Patient Name: Glenn Collins MRN: [id number] DOB:1945-04-21, 78 y.o., male Today's Date: 08/07/2023  END OF SESSION:  PT End of Session - 08/06/23 1134     Visit Number 1    Number of Visits 7    Date for PT Re-Evaluation 09/17/23    Authorization Type BCBS MCR    PT Start Time 1108    PT Stop Time 1159    PT Time Calculation (min) 51 min    Activity Tolerance Patient tolerated treatment well    Behavior During Therapy [facility] for tasks assessed/performed             Past Medical History:  Diagnosis Date   Arthritis    CAD (coronary artery disease) 10/27/2018   Carotid artery occlusion    Coronary artery disease    Heart murmur Since childhood   History of kidney stones 2021   Hypertension    PAD (peripheral artery disease) (HCC) 10/27/2018   Presence of permanent cardiac pacemaker    Biotronik PPM   Past Surgical History:  Procedure Laterality Date   CARDIAC SURGERY  2019   with stents x2   ENDARTERECTOMY Left 03/27/2023   Procedure: ENDARTERECTOMY CAROTID;  Surgeon: Adine Hoof, MD;  Location: [facility] OR;  Service: Vascular;  Laterality: Left;   HERNIA REPAIR  2012   PACEMAKER IMPLANT N/A 09/29/2019   Procedure: PACEMAKER IMPLANT;  Surgeon: Tammie Fall, MD;  Location: [facility] INVASIVE CV LAB;  Service: Cardiovascular;  Laterality: N/A;   PATCH ANGIOPLASTY Left 03/27/2023   Procedure: Corinna Dickens PATCH ANGIOPLASTY;  Surgeon: Adine Hoof, MD;  Location: [facility] OR;  Service: Vascular;  Laterality: Left;   TOTAL HIP ARTHROPLASTY Right 06/15/2021   Procedure: TOTAL HIP ARTHROPLASTY ANTERIOR APPROACH;  Surgeon: Adonica Hoose, MD;  Location: [facility] ORS;  Service: Orthopedics;  Laterality: Right;   Patient Active Problem List   Diagnosis Date Noted   Sciatica of left side 07/10/2023   Carotid artery stenosis 03/27/2023   Carotid stenosis, asymptomatic, left 03/27/2023   Abscess 03/15/2022    Environmental and seasonal allergies 01/10/2022   DVT (deep venous thrombosis) (HCC) 08/11/2021   Lower extremity edema 07/08/2021   Osteoarthritis of right hip 06/15/2021   Preoperative clearance 05/04/2021   Wellness examination 03/14/2021   Valvular heart disease 12/23/2020   Dyslipidemia 12/23/2020   Greater trochanteric pain syndrome of right lower extremity 12/23/2020   Stenosis of left subclavian artery (HCC) 04/02/2020   Aortic stenosis 04/02/2020   Pacemaker 01/05/2020   Complete heart block (HCC) 09/28/2019   Syncope 09/08/2019   Hyperlipidemia 04/22/2019   Bilateral carotid bruits 12/24/2018   PAD (peripheral artery disease) (HCC) 10/27/2018   Hypertension 10/27/2018   CAD (coronary artery disease) 10/27/2018   NSTEMI (non-ST elevated myocardial infarction) 10/27/2018   Acute back pain 10/27/2018    REFERRING PROVIDER: de [place], Raymond J, MD  REFERRING DIAG: [phone] (ICD-10-CM) - Sciatica of left side  Rationale for Evaluation and Treatment: Rehabilitation  THERAPY DIAG:  Pain in left leg  Pain in right leg  Muscle weakness (generalized)  Difficulty in walking, not elsewhere classified  ONSET DATE: 07/03/23  SUBJECTIVE:  SUBJECTIVE STATEMENT: Pt saw MD on 3/25 and he reports pain began 1 week before he saw MD.  He had no specific MOI.  Pt states his pain comes and goes.  Pain was in bilat buttocks and traveled down leg.  He also has pain in bilat calves.  Pt has not experienced this pain before.  Pt has had no imaging.   Pt is limiting himself physically due to pain.  He is not walking outdoors.   When he has the pain, he has much difficulty with walking.   "It makes me afraid to walk sometimes."  Pt is able to perform stairs without any issues.  Pt reports he is able to perform  his normal household chores without difficulty.  Pt states he is not having the pain today.   Pt had HHPT having a procedure to clean out blockage in carotid artery in December.  He has been performing his HH HEP.    PERTINENT HISTORY:  Pt has a PACEMAKER-- NO E-STIM. R THA with anterior approach 03/23 HTN, PAD, arthritis CAD with Cardiac surgery with 2 stents Pt had a procedure to clean out blockage in carotid artery in December.  Pt uses a cane occasionally due to being light-headed because of allergies.    PAIN:  NPRS:  0/10 current, 7-8/10 worst Location:  bilat buttocks down bilat LE's.  Sometimes just in bilat calves.  Pt states he hasn't had LBP. Type:  Intermittent, sharp, stabbing.  Pt denies N/T.  Easing Factors:  standing Aggravating factors:  occasionally bending  PRECAUTIONS: ICD/Pacemaker and Other: R THA with anterior approach in 2023  RED FLAGS: None   WEIGHT BEARING RESTRICTIONS: No  FALLS:  Has patient fallen in last 6 months? Yes. Number of falls 2 falls in 1 week about 3 months ago  LIVING ENVIRONMENT: Lives with: lives with their spouse Lives in: 3 story home Stairs: yes Has following equipment at home: Single point cane and Environmental consultant - 2 wheeled   PLOF: Independent.  Pt uses a cane occasionally due to being light-headed because of allergies.  PATIENT GOALS: to decrease pain and improve walking  NEXT MD VISIT: walk better and decrease pain  OBJECTIVE:  Note: Objective measures were completed at Evaluation unless otherwise noted.  DIAGNOSTIC FINDINGS:  Pt has had no imaging performed.   PATIENT SURVEYS:  Modified Oswestry 24%   COGNITION: Overall cognitive status: Within functional limits for tasks assessed       MUSCLE LENGTH: Hamstrings: tightness in bilat HS   LUMBAR ROM:   AROM eval  Flexion   Extension   Right lateral flexion   Left lateral flexion   Right rotation   Left rotation    (Blank rows = not tested)  LOWER  EXTREMITY MMT:    MMT Right eval Left eval  Hip flexion Weak, Tolerates slight resistance 4+/5  Hip extension    Hip abduction 28.1 ; Pt has difficulty performing S/L hip abduction 27.0 ;  Pt has difficulty performing S/L hip abduction  Hip adduction    Hip internal rotation    Hip external rotation    Knee flexion 5/5 seated 5/5 seated  Knee extension 5/5 5/5  Ankle dorsiflexion    Ankle plantarflexion    Ankle inversion    Ankle eversion     (Blank rows = not tested)  LUMBAR SPECIAL TESTS:  SLR test:  positive bilat Slump test:  positive bilat   GAIT: Assistive device utilized: None Level of assistance: Independent  Comments: decreased toe off bilat, bilat toe off, leans to R with gait.  Pt has a limp with gait.   TREATMENT:                                                                                                                                Pt performed LAQ with ankle pumps (10) x 2 sets bilat.  Pt received a HEP handout and was educated in correct form and appropriate frequency.  Pt instructed he should not have significant pain with exercise.    PATIENT EDUCATION:  Education details: dx, objective findings, HEP, POC, rationale of interventions, relevant anatomy, and what to expect next treatment.  PT answered pt's questions. Person educated: Patient Education method: Explanation, Demonstration, Verbal cues, and Handouts Education comprehension: verbalized understanding, returned demonstration, verbal cues required, and needs further education  HOME EXERCISE PROGRAM: Access Code: [email or web address] URL: [email or web address] Date: 08/06/2023 Prepared by: Marnie Siren  Exercises - Knee extension with Ankle Pumps  - 2 x daily - 7 x weekly - 2 reps  ASSESSMENT:  CLINICAL IMPRESSION: Patient is a 78 y.o. male with a dx of L sided sciatica presenting to the clinic with L > R LE pain, muscle weakness, and difficulty in walking.  Pt has intermittent pain  and limits himself physically due to pain.  Pt has much difficulty with walking when he is having pain.  He is not walking outdoors.  Pt has positive neural tension tests bilat.  Pt should benefit from skilled PT to address impairments and improve overall function.    OBJECTIVE IMPAIRMENTS: Abnormal gait, decreased activity tolerance, decreased mobility, difficulty walking, decreased strength, impaired flexibility, and pain.   ACTIVITY LIMITATIONS: locomotion level  PARTICIPATION LIMITATIONS: community activity  PERSONAL FACTORS: Age and 3+ comorbidities: Pacemaker, R THA, PAD, CAD  are also affecting patient's functional outcome.   REHAB POTENTIAL: Good  CLINICAL DECISION MAKING: Stable/uncomplicated  EVALUATION COMPLEXITY: Low   GOALS:  SHORT TERM GOALS: Target date: 08/27/2023   Pt will be independent and compliant with HEP for improved pain, strength, and function.   Baseline: Goal status: INITIAL  2.  Pt will report at least a 25% improvement in pain and sx's overall.   Baseline:  Goal status: INITIAL  3.  Pt's worst pain will be <6/10 for improved mobility and ambulation.   Baseline:  Goal status: INITIAL Target date:  09/03/2023    LONG TERM GOALS: Target date: 09/17/2023  Pt will be able to perform his normal community ambulation without increased pain.   Baseline:  Goal status: INITIAL  2.  Pt will be able to walk outdoors with improved confidence and without significant pain. Baseline:  Goal status: INITIAL  3.  Pt will report at least a 70% improvement in pain and sx's overall.   Baseline:  Goal status: INITIAL    PLAN:  PT FREQUENCY: 1-2x/wk, primarily once per week  PT DURATION: 6 weeks  PLANNED  INTERVENTIONS: 97164- PT Re-evaluation, 97750- Physical Performance Testing, 97110-Therapeutic exercises, 97530- Therapeutic activity, [id number]- Neuromuscular re-education, [phone]- Self Care, 60454- Manual therapy, [phone]- Gait training, [phone]- Aquatic Therapy,  [phone]- Ultrasound, Patient/Family education, Balance training, Stair training, Taping, Dry Needling, Joint mobilization, Spinal mobilization, DME instructions, Cryotherapy, and Moist heat.  PLAN FOR NEXT SESSION:  Review HEP from HHPT.  Cont with neural flossing techniques.  Core and LE strengthening.  Review home exercise. Pt has a PACEMAKER-- NO E-STIM.   Trina Fujita III PT, DPT 08/07/23 11:35 PM

## 2023-08-06 ENCOUNTER — Encounter (HOSPITAL_BASED_OUTPATIENT_CLINIC_OR_DEPARTMENT_OTHER): Payer: Self-pay | Admitting: Physical Therapy

## 2023-08-06 ENCOUNTER — Ambulatory Visit (HOSPITAL_BASED_OUTPATIENT_CLINIC_OR_DEPARTMENT_OTHER): Attending: Family Medicine | Admitting: Physical Therapy

## 2023-08-06 ENCOUNTER — Other Ambulatory Visit: Payer: Self-pay

## 2023-08-06 DIAGNOSIS — M6281 Muscle weakness (generalized): Secondary | ICD-10-CM | POA: Diagnosis present

## 2023-08-06 DIAGNOSIS — M79605 Pain in left leg: Secondary | ICD-10-CM | POA: Insufficient documentation

## 2023-08-06 DIAGNOSIS — M5432 Sciatica, left side: Secondary | ICD-10-CM | POA: Diagnosis not present

## 2023-08-06 DIAGNOSIS — M79604 Pain in right leg: Secondary | ICD-10-CM | POA: Insufficient documentation

## 2023-08-06 DIAGNOSIS — R262 Difficulty in walking, not elsewhere classified: Secondary | ICD-10-CM | POA: Insufficient documentation

## 2023-08-10 NOTE — Progress Notes (Signed)
 Remote pacemaker transmission.

## 2023-08-16 ENCOUNTER — Encounter (HOSPITAL_BASED_OUTPATIENT_CLINIC_OR_DEPARTMENT_OTHER): Payer: Self-pay

## 2023-08-16 ENCOUNTER — Ambulatory Visit (HOSPITAL_BASED_OUTPATIENT_CLINIC_OR_DEPARTMENT_OTHER): Attending: Family Medicine

## 2023-08-16 DIAGNOSIS — M79605 Pain in left leg: Secondary | ICD-10-CM

## 2023-08-16 DIAGNOSIS — M79604 Pain in right leg: Secondary | ICD-10-CM | POA: Diagnosis present

## 2023-08-16 DIAGNOSIS — R262 Difficulty in walking, not elsewhere classified: Secondary | ICD-10-CM

## 2023-08-16 DIAGNOSIS — M6281 Muscle weakness (generalized): Secondary | ICD-10-CM

## 2023-08-16 NOTE — Therapy (Signed)
 OUTPATIENT PHYSICAL THERAPY THORACOLUMBAR EVALUATION   Patient Name: Glenn Collins MRN: [id number] DOB:1945-08-28, 78 y.o., male Today's Date: 08/16/2023  END OF SESSION:  PT End of Session - 08/16/23 1421     Visit Number 2    Number of Visits 7    Date for PT Re-Evaluation 09/17/23    Authorization Type BCBS MCR    PT Start Time 1432    PT Stop Time 1511    PT Time Calculation (min) 39 min    Activity Tolerance Patient tolerated treatment well    Behavior During Therapy [facility] for tasks assessed/performed              Past Medical History:  Diagnosis Date   Arthritis    CAD (coronary artery disease) 10/27/2018   Carotid artery occlusion    Coronary artery disease    Heart murmur Since childhood   History of kidney stones 2021   Hypertension    PAD (peripheral artery disease) (HCC) 10/27/2018   Presence of permanent cardiac pacemaker    Biotronik PPM   Past Surgical History:  Procedure Laterality Date   CARDIAC SURGERY  2019   with stents x2   ENDARTERECTOMY Left 03/27/2023   Procedure: ENDARTERECTOMY CAROTID;  Surgeon: Adine Hoof, MD;  Location: [facility] OR;  Service: Vascular;  Laterality: Left;   HERNIA REPAIR  2012   PACEMAKER IMPLANT N/A 09/29/2019   Procedure: PACEMAKER IMPLANT;  Surgeon: Tammie Fall, MD;  Location: [facility] INVASIVE CV LAB;  Service: Cardiovascular;  Laterality: N/A;   PATCH ANGIOPLASTY Left 03/27/2023   Procedure: Corinna Dickens PATCH ANGIOPLASTY;  Surgeon: Adine Hoof, MD;  Location: [facility] OR;  Service: Vascular;  Laterality: Left;   TOTAL HIP ARTHROPLASTY Right 06/15/2021   Procedure: TOTAL HIP ARTHROPLASTY ANTERIOR APPROACH;  Surgeon: Adonica Hoose, MD;  Location: [facility] ORS;  Service: Orthopedics;  Laterality: Right;   Patient Active Problem List   Diagnosis Date Noted   Sciatica of left side 07/10/2023   Carotid artery stenosis 03/27/2023   Carotid stenosis, asymptomatic, left 03/27/2023   Abscess 03/15/2022    Environmental and seasonal allergies 01/10/2022   DVT (deep venous thrombosis) (HCC) 08/11/2021   Lower extremity edema 07/08/2021   Osteoarthritis of right hip 06/15/2021   Preoperative clearance 05/04/2021   Wellness examination 03/14/2021   Valvular heart disease 12/23/2020   Dyslipidemia 12/23/2020   Greater trochanteric pain syndrome of right lower extremity 12/23/2020   Stenosis of left subclavian artery (HCC) 04/02/2020   Aortic stenosis 04/02/2020   Pacemaker 01/05/2020   Complete heart block (HCC) 09/28/2019   Syncope 09/08/2019   Hyperlipidemia 04/22/2019   Bilateral carotid bruits 12/24/2018   PAD (peripheral artery disease) (HCC) 10/27/2018   Hypertension 10/27/2018   CAD (coronary artery disease) 10/27/2018   NSTEMI (non-ST elevated myocardial infarction) 10/27/2018   Acute back pain 10/27/2018    REFERRING PROVIDER: de [place], Raymond J, MD  REFERRING DIAG: [phone] (ICD-10-CM) - Sciatica of left side  Rationale for Evaluation and Treatment: Rehabilitation  THERAPY DIAG:  Pain in right leg  Pain in left leg  Muscle weakness (generalized)  Difficulty in walking, not elsewhere classified  ONSET DATE: 07/03/23  SUBJECTIVE:  SUBJECTIVE STATEMENT:  Pt reports sciatic pain "comes and goes". Eval: Pt saw MD on 3/25 and he reports pain began 1 week before he saw MD.  He had no specific MOI.  Pt states his pain comes and goes.  Pain was in bilat buttocks and traveled down leg.  He also has pain in bilat calves.  Pt has not experienced this pain before.  Pt has had no imaging.   Pt is limiting himself physically due to pain.  He is not walking outdoors.   When he has the pain, he has much difficulty with walking.   "It makes me afraid to walk sometimes."  Pt is able to perform stairs  without any issues.  Pt reports he is able to perform his normal household chores without difficulty.  Pt states he is not having the pain today.   Pt had HHPT having a procedure to clean out blockage in carotid artery in December.  He has been performing his HH HEP.    PERTINENT HISTORY:  Pt has a PACEMAKER-- NO E-STIM. R THA with anterior approach 03/23 HTN, PAD, arthritis CAD with Cardiac surgery with 2 stents Pt had a procedure to clean out blockage in carotid artery in December.  Pt uses a cane occasionally due to being light-headed because of allergies.    PAIN:  NPRS:  0/10 current, 7-8/10 worst Location:  bilat buttocks down bilat LE's.  Sometimes just in bilat calves.  Pt states he hasn't had LBP. Type:  Intermittent, sharp, stabbing.  Pt denies N/T.  Easing Factors:  standing Aggravating factors:  occasionally bending  PRECAUTIONS: ICD/Pacemaker and Other: R THA with anterior approach in 2023  RED FLAGS: None   WEIGHT BEARING RESTRICTIONS: No  FALLS:  Has patient fallen in last 6 months? Yes. Number of falls 2 falls in 1 week about 3 months ago  LIVING ENVIRONMENT: Lives with: lives with their spouse Lives in: 3 story home Stairs: yes Has following equipment at home: Single point cane and Environmental consultant - 2 wheeled   PLOF: Independent.  Pt uses a cane occasionally due to being light-headed because of allergies.  PATIENT GOALS: to decrease pain and improve walking  NEXT MD VISIT: walk better and decrease pain  OBJECTIVE:  Note: Objective measures were completed at Evaluation unless otherwise noted.  DIAGNOSTIC FINDINGS:  Pt has had no imaging performed.   PATIENT SURVEYS:  Modified Oswestry 24%   COGNITION: Overall cognitive status: Within functional limits for tasks assessed       MUSCLE LENGTH: Hamstrings: tightness in bilat HS   LUMBAR ROM:   AROM eval  Flexion   Extension   Right lateral flexion   Left lateral flexion   Right rotation   Left  rotation    (Blank rows = not tested)  LOWER EXTREMITY MMT:    MMT Right eval Left eval  Hip flexion Weak, Tolerates slight resistance 4+/5  Hip extension    Hip abduction 28.1 ; Pt has difficulty performing S/L hip abduction 27.0 ;  Pt has difficulty performing S/L hip abduction  Hip adduction    Hip internal rotation    Hip external rotation    Knee flexion 5/5 seated 5/5 seated  Knee extension 5/5 5/5  Ankle dorsiflexion    Ankle plantarflexion    Ankle inversion    Ankle eversion     (Blank rows = not tested)  LUMBAR SPECIAL TESTS:  SLR test:  positive bilat Slump test:  positive bilat  GAIT: Assistive device utilized: None Level of assistance: Independent Comments: decreased toe off bilat, bilat toe off, leans to R with gait.  Pt has a limp with gait.   TREATMENT:                                                                                                                                 Piriformis stretch 3x30sec HSS with strap 3x30sec ea Thomas stretch DKTC 30sec x3  PPT 5" 2x10  BKFO 2x10ea with TA  Quadruped cat/cow x10 Prone hip extension 2x10 ea (challenging)     Pt performed LAQ with ankle pumps (10) x 2 sets bilat.  Pt received a HEP handout and was educated in correct form and appropriate frequency.  Pt instructed he should not have significant pain with exercise.    PATIENT EDUCATION:  Education details: dx, objective findings, HEP, POC, rationale of interventions, relevant anatomy, and what to expect next treatment.  PT answered pt's questions. Person educated: Patient Education method: Explanation, Demonstration, Verbal cues, and Handouts Education comprehension: verbalized understanding, returned demonstration, verbal cues required, and needs further education  HOME EXERCISE PROGRAM: Access Code: [email or web address] URL: [email or web address] Date: 08/06/2023 Prepared by: Marnie Siren  Exercises - Knee extension with Ankle Pumps   - 2 x daily - 7 x weekly - 2 reps  ASSESSMENT:  CLINICAL IMPRESSION:  Pt responded well to gentle stretching program today. Cuing was required for proper set up and performance with most tasks today. He felt beneficial stretch from [facility] and piriformis. Did experience HS cramping with prone hip extension. Core weakness observed with transitional movements. Spent time educating pt on proper TA activation and exercises. HEP was updated to include additional exercises.   Eval: Patient is a 78 y.o. male with a dx of L sided sciatica presenting to the clinic with L > R LE pain, muscle weakness, and difficulty in walking.  Pt has intermittent pain and limits himself physically due to pain.  Pt has much difficulty with walking when he is having pain.  He is not walking outdoors.  Pt has positive neural tension tests bilat.  Pt should benefit from skilled PT to address impairments and improve overall function.    OBJECTIVE IMPAIRMENTS: Abnormal gait, decreased activity tolerance, decreased mobility, difficulty walking, decreased strength, impaired flexibility, and pain.   ACTIVITY LIMITATIONS: locomotion level  PARTICIPATION LIMITATIONS: community activity  PERSONAL FACTORS: Age and 3+ comorbidities: Pacemaker, R THA, PAD, CAD  are also affecting patient's functional outcome.   REHAB POTENTIAL: Good  CLINICAL DECISION MAKING: Stable/uncomplicated  EVALUATION COMPLEXITY: Low   GOALS:  SHORT TERM GOALS: Target date: 08/27/2023   Pt will be independent and compliant with HEP for improved pain, strength, and function.   Baseline: Goal status: INITIAL  2.  Pt will report at least a 25% improvement in pain and sx's overall.   Baseline:  Goal status: INITIAL  3.  Pt's worst pain will be <6/10  for improved mobility and ambulation.   Baseline:  Goal status: INITIAL Target date:  09/03/2023    LONG TERM GOALS: Target date: 09/17/2023  Pt will be able to perform his normal community  ambulation without increased pain.   Baseline:  Goal status: INITIAL  2.  Pt will be able to walk outdoors with improved confidence and without significant pain. Baseline:  Goal status: INITIAL  3.  Pt will report at least a 70% improvement in pain and sx's overall.   Baseline:  Goal status: INITIAL    PLAN:  PT FREQUENCY: 1-2x/wk, primarily once per week  PT DURATION: 6 weeks  PLANNED INTERVENTIONS: 97164- PT Re-evaluation, 97750- Physical Performance Testing, 97110-Therapeutic exercises, 97530- Therapeutic activity, 97112- Neuromuscular re-education, 97535- Self Care, 16109- Manual therapy, [phone]- Gait training, [phone]- Aquatic Therapy, [phone]- Ultrasound, Patient/Family education, Balance training, Stair training, Taping, Dry Needling, Joint mobilization, Spinal mobilization, DME instructions, Cryotherapy, and Moist heat.  PLAN FOR NEXT SESSION:  Review HEP from HHPT.  Cont with neural flossing techniques.  Core and LE strengthening.  Review home exercise. Pt has a PACEMAKER-- NO E-STIM.   Herb Loges, PTA  08/16/23 4:41 PM

## 2023-08-21 ENCOUNTER — Ambulatory Visit (HOSPITAL_BASED_OUTPATIENT_CLINIC_OR_DEPARTMENT_OTHER): Admitting: Physical Therapy

## 2023-08-21 ENCOUNTER — Other Ambulatory Visit: Payer: Self-pay

## 2023-08-21 DIAGNOSIS — M79604 Pain in right leg: Secondary | ICD-10-CM

## 2023-08-21 DIAGNOSIS — R262 Difficulty in walking, not elsewhere classified: Secondary | ICD-10-CM

## 2023-08-21 DIAGNOSIS — M6281 Muscle weakness (generalized): Secondary | ICD-10-CM

## 2023-08-21 DIAGNOSIS — M79605 Pain in left leg: Secondary | ICD-10-CM

## 2023-08-21 DIAGNOSIS — E782 Mixed hyperlipidemia: Secondary | ICD-10-CM

## 2023-08-21 MED ORDER — EZETIMIBE 10 MG PO TABS: 10.0000 mg | ORAL_TABLET | Freq: Every day | ORAL | 3 refills | Status: DC

## 2023-08-21 NOTE — Therapy (Signed)
 OUTPATIENT PHYSICAL THERAPY THORACOLUMBAR TREATMENT   Patient Name: Glenn Collins MRN: [id number] DOB:1946-04-01, 78 y.o., male Today's Date: 08/22/2023  END OF SESSION:  PT End of Session - 08/21/23 1500     Visit Number 3    Number of Visits 7    Date for PT Re-Evaluation 09/17/23    Authorization Type BCBS MCR    PT Start Time 1457    PT Stop Time 1531    PT Time Calculation (min) 34 min    Activity Tolerance Patient tolerated treatment well    Behavior During Therapy [facility] for tasks assessed/performed              Past Medical History:  Diagnosis Date   Arthritis    CAD (coronary artery disease) 10/27/2018   Carotid artery occlusion    Coronary artery disease    Heart murmur Since childhood   History of kidney stones 2021   Hypertension    PAD (peripheral artery disease) (HCC) 10/27/2018   Presence of permanent cardiac pacemaker    Biotronik PPM   Past Surgical History:  Procedure Laterality Date   CARDIAC SURGERY  2019   with stents x2   ENDARTERECTOMY Left 03/27/2023   Procedure: ENDARTERECTOMY CAROTID;  Surgeon: Adine Hoof, MD;  Location: [facility] OR;  Service: Vascular;  Laterality: Left;   HERNIA REPAIR  2012   PACEMAKER IMPLANT N/A 09/29/2019   Procedure: PACEMAKER IMPLANT;  Surgeon: Tammie Fall, MD;  Location: [facility] INVASIVE CV LAB;  Service: Cardiovascular;  Laterality: N/A;   PATCH ANGIOPLASTY Left 03/27/2023   Procedure: Corinna Dickens PATCH ANGIOPLASTY;  Surgeon: Adine Hoof, MD;  Location: [facility] OR;  Service: Vascular;  Laterality: Left;   TOTAL HIP ARTHROPLASTY Right 06/15/2021   Procedure: TOTAL HIP ARTHROPLASTY ANTERIOR APPROACH;  Surgeon: Adonica Hoose, MD;  Location: [facility] ORS;  Service: Orthopedics;  Laterality: Right;   Patient Active Problem List   Diagnosis Date Noted   Sciatica of left side 07/10/2023   Carotid artery stenosis 03/27/2023   Carotid stenosis, asymptomatic, left 03/27/2023   Abscess 03/15/2022    Environmental and seasonal allergies 01/10/2022   DVT (deep venous thrombosis) (HCC) 08/11/2021   Lower extremity edema 07/08/2021   Osteoarthritis of right hip 06/15/2021   Preoperative clearance 05/04/2021   Wellness examination 03/14/2021   Valvular heart disease 12/23/2020   Dyslipidemia 12/23/2020   Greater trochanteric pain syndrome of right lower extremity 12/23/2020   Stenosis of left subclavian artery (HCC) 04/02/2020   Aortic stenosis 04/02/2020   Pacemaker 01/05/2020   Complete heart block (HCC) 09/28/2019   Syncope 09/08/2019   Hyperlipidemia 04/22/2019   Bilateral carotid bruits 12/24/2018   PAD (peripheral artery disease) (HCC) 10/27/2018   Hypertension 10/27/2018   CAD (coronary artery disease) 10/27/2018   NSTEMI (non-ST elevated myocardial infarction) 10/27/2018   Acute back pain 10/27/2018    REFERRING PROVIDER: de [place], Raymond J, MD  REFERRING DIAG: [phone] (ICD-10-CM) - Sciatica of left side  Rationale for Evaluation and Treatment: Rehabilitation  THERAPY DIAG:  Pain in right leg  Pain in left leg  Muscle weakness (generalized)  Difficulty in walking, not elsewhere classified  ONSET DATE: 07/03/23  SUBJECTIVE:  SUBJECTIVE STATEMENT:  Pt states he is not getting the sharp pains anymore.  Pt states the stretches are really helping.  He is not having the pain shooting down leg though occasionally has pain in L calf.  Pt denies any adverse effects after prior Rx.    PERTINENT HISTORY:  Pt has a PACEMAKER-- NO E-STIM. R THA with anterior approach 03/23 HTN, PAD, arthritis CAD with Cardiac surgery with 2 stents Pt had a procedure to clean out blockage in carotid artery in December.  Pt uses a cane occasionally due to being light-headed because of allergies.    PAIN:   NPRS:  0/10 current, 7-8/10 worst Location:  bilat buttocks down bilat LE's.  Sometimes just in bilat calves.  Pt states he hasn't had LBP. Type:  Intermittent, sharp, stabbing.  Pt denies N/T.  Easing Factors:  standing Aggravating factors:  occasionally bending  PRECAUTIONS: ICD/Pacemaker and Other: R THA with anterior approach in 2023  RED FLAGS: None   WEIGHT BEARING RESTRICTIONS: No  FALLS:  Has patient fallen in last 6 months? Yes. Number of falls 2 falls in 1 week about 3 months ago  LIVING ENVIRONMENT: Lives with: lives with their spouse Lives in: 3 story home Stairs: yes Has following equipment at home: Single point cane and Environmental consultant - 2 wheeled   PLOF: Independent.  Pt uses a cane occasionally due to being light-headed because of allergies.  PATIENT GOALS: to decrease pain and improve walking  NEXT MD VISIT: walk better and decrease pain  OBJECTIVE:  Note: Objective measures were completed at Evaluation unless otherwise noted.  DIAGNOSTIC FINDINGS:  Pt has had no imaging performed.   PATIENT SURVEYS:  Modified Oswestry 24%   COGNITION: Overall cognitive status: Within functional limits for tasks assessed       MUSCLE LENGTH: Hamstrings: tightness in bilat HS   LUMBAR ROM:   AROM eval  Flexion   Extension   Right lateral flexion   Left lateral flexion   Right rotation   Left rotation    (Blank rows = not tested)  LOWER EXTREMITY MMT:    MMT Right eval Left eval  Hip flexion Weak, Tolerates slight resistance 4+/5  Hip extension    Hip abduction 28.1 ; Pt has difficulty performing S/L hip abduction 27.0 ;  Pt has difficulty performing S/L hip abduction  Hip adduction    Hip internal rotation    Hip external rotation    Knee flexion 5/5 seated 5/5 seated  Knee extension 5/5 5/5  Ankle dorsiflexion    Ankle plantarflexion    Ankle inversion    Ankle eversion     (Blank rows = not tested)  LUMBAR SPECIAL TESTS:  SLR test:  positive  bilat Slump test:  positive bilat   GAIT: Assistive device utilized: None Level of assistance: Independent Comments: decreased toe off bilat, bilat toe off, leans to R with gait.  Pt has a limp with gait.   TREATMENT:  Reviewed prior HEP from home health:  Pt was performing marching, standing hip abd, standing hip extension, standing HS curls, heel/toe raises, and squats with UE support on counter.   LAQ with AP's (10) x 2 sets bilat Seated nn flossing x10 Supine PPT 2x10 Supine HS stretch with strap 2x30 sec bilat PT educated pt with correct form and palpation of TrA contraction.  Pt performed TrA contraction in hooklying. Updated HEP and gave pt a HEP handout.  PT educated pt in correct form and appropriate frequency.  Pt received manual HS stretch with passive AP's for nn flossing 2 sets bilat    PATIENT EDUCATION:  Education details: dx, HEP, POC, exercise form, HEP, rationale of interventions, relevant anatomy, and what to expect next treatment.  PT answered pt's questions. Person educated: Patient Education method: Explanation, Demonstration, Verbal cues, and Handouts Education comprehension: verbalized understanding, returned demonstration, verbal cues required, and needs further education  HOME EXERCISE PROGRAM: Access Code: [email or web address] URL: [email or web address] Date: 08/06/2023 Prepared by: Marnie Siren  Updated HEP: - Supine Transversus Abdominis Bracing - Hands on Stomach  - 2 x daily - 7 x weekly - 2 sets - 10 reps  ASSESSMENT:  CLINICAL IMPRESSION:  Pt is improving with sx's as evidenced by subjective reports.  He reports not having the sharp and shooting pains anymore. PT focused on neural mobility and core stabilization today.  Pt requires cuing for correct form with exercises.  PT instructed pt in performance and palpation  of TrA contraction.  He improved with TrA contractions with instruction/cuing and as he performed increased repetitions.  PT gave pt a handout of TrA contraction for his HEP.  Pt responded well to treatment reporting no pain and having no sx's after Rx.    OBJECTIVE IMPAIRMENTS: Abnormal gait, decreased activity tolerance, decreased mobility, difficulty walking, decreased strength, impaired flexibility, and pain.   ACTIVITY LIMITATIONS: locomotion level  PARTICIPATION LIMITATIONS: community activity  PERSONAL FACTORS: Age and 3+ comorbidities: Pacemaker, R THA, PAD, CAD  are also affecting patient's functional outcome.   REHAB POTENTIAL: Good  CLINICAL DECISION MAKING: Stable/uncomplicated  EVALUATION COMPLEXITY: Low   GOALS:  SHORT TERM GOALS: Target date: 08/27/2023   Pt will be independent and compliant with HEP for improved pain, strength, and function.   Baseline: Goal status: INITIAL  2.  Pt will report at least a 25% improvement in pain and sx's overall.   Baseline:  Goal status: INITIAL  3.  Pt's worst pain will be <6/10 for improved mobility and ambulation.   Baseline:  Goal status: INITIAL Target date:  09/03/2023    LONG TERM GOALS: Target date: 09/17/2023  Pt will be able to perform his normal community ambulation without increased pain.   Baseline:  Goal status: INITIAL  2.  Pt will be able to walk outdoors with improved confidence and without significant pain. Baseline:  Goal status: INITIAL  3.  Pt will report at least a 70% improvement in pain and sx's overall.   Baseline:  Goal status: INITIAL    PLAN:  PT FREQUENCY: 1-2x/wk, primarily once per week  PT DURATION: 6 weeks  PLANNED INTERVENTIONS: 97164- PT Re-evaluation, 97750- Physical Performance Testing, 97110-Therapeutic exercises, 97530- Therapeutic activity, 97112- Neuromuscular re-education, 97535- Self Care, 30865- Manual therapy, [phone]- Gait training, [phone]- Aquatic Therapy, [phone]-  Ultrasound, Patient/Family education, Balance training, Stair training, Taping, Dry Needling, Joint mobilization, Spinal mobilization, DME instructions, Cryotherapy, and Moist heat.  PLAN FOR NEXT SESSION:  Review HEP from HHPT.  Cont with  neural flossing techniques.  Core and LE strengthening.   Pt has a PACEMAKER-- NO E-STIM.   Trina Fujita III PT, DPT 08/22/23 10:16 PM

## 2023-08-22 ENCOUNTER — Other Ambulatory Visit: Payer: Self-pay

## 2023-08-22 ENCOUNTER — Encounter (HOSPITAL_BASED_OUTPATIENT_CLINIC_OR_DEPARTMENT_OTHER): Payer: Self-pay | Admitting: Physical Therapy

## 2023-08-22 DIAGNOSIS — E782 Mixed hyperlipidemia: Secondary | ICD-10-CM

## 2023-08-22 MED ORDER — EZETIMIBE 10 MG PO TABS: 10.0000 mg | ORAL_TABLET | Freq: Every day | ORAL | 3 refills | Status: AC

## 2023-08-29 ENCOUNTER — Encounter (HOSPITAL_BASED_OUTPATIENT_CLINIC_OR_DEPARTMENT_OTHER): Payer: Self-pay | Admitting: Physical Therapy

## 2023-08-29 ENCOUNTER — Ambulatory Visit (HOSPITAL_BASED_OUTPATIENT_CLINIC_OR_DEPARTMENT_OTHER): Admitting: Physical Therapy

## 2023-08-29 DIAGNOSIS — M79604 Pain in right leg: Secondary | ICD-10-CM | POA: Diagnosis not present

## 2023-08-29 DIAGNOSIS — M79605 Pain in left leg: Secondary | ICD-10-CM

## 2023-08-29 DIAGNOSIS — M6281 Muscle weakness (generalized): Secondary | ICD-10-CM

## 2023-08-29 DIAGNOSIS — R262 Difficulty in walking, not elsewhere classified: Secondary | ICD-10-CM

## 2023-08-29 NOTE — Therapy (Signed)
 OUTPATIENT PHYSICAL THERAPY THORACOLUMBAR TREATMENT   Patient Name: Glenn Collins MRN: [id number] DOB:Feb 01, 1946, 78 y.o., male Today's Date: 08/29/2023  END OF SESSION:  PT End of Session - 08/29/23 1309     Visit Number 4    Number of Visits 7    Date for PT Re-Evaluation 09/17/23    Authorization Type BCBS MCR    PT Start Time 1315    PT Stop Time 1347    PT Time Calculation (min) 32 min    Activity Tolerance Patient tolerated treatment well    Behavior During Therapy [facility] for tasks assessed/performed              Past Medical History:  Diagnosis Date   Arthritis    CAD (coronary artery disease) 10/27/2018   Carotid artery occlusion    Coronary artery disease    Heart murmur Since childhood   History of kidney stones 2021   Hypertension    PAD (peripheral artery disease) (HCC) 10/27/2018   Presence of permanent cardiac pacemaker    Biotronik PPM   Past Surgical History:  Procedure Laterality Date   CARDIAC SURGERY  2019   with stents x2   ENDARTERECTOMY Left 03/27/2023   Procedure: ENDARTERECTOMY CAROTID;  Surgeon: Adine Hoof, MD;  Location: [facility] OR;  Service: Vascular;  Laterality: Left;   HERNIA REPAIR  2012   PACEMAKER IMPLANT N/A 09/29/2019   Procedure: PACEMAKER IMPLANT;  Surgeon: Tammie Fall, MD;  Location: [facility] INVASIVE CV LAB;  Service: Cardiovascular;  Laterality: N/A;   PATCH ANGIOPLASTY Left 03/27/2023   Procedure: Corinna Dickens PATCH ANGIOPLASTY;  Surgeon: Adine Hoof, MD;  Location: [facility] OR;  Service: Vascular;  Laterality: Left;   TOTAL HIP ARTHROPLASTY Right 06/15/2021   Procedure: TOTAL HIP ARTHROPLASTY ANTERIOR APPROACH;  Surgeon: Adonica Hoose, MD;  Location: [facility] ORS;  Service: Orthopedics;  Laterality: Right;   Patient Active Problem List   Diagnosis Date Noted   Sciatica of left side 07/10/2023   Carotid artery stenosis 03/27/2023   Carotid stenosis, asymptomatic, left 03/27/2023   Abscess 03/15/2022    Environmental and seasonal allergies 01/10/2022   DVT (deep venous thrombosis) (HCC) 08/11/2021   Lower extremity edema 07/08/2021   Osteoarthritis of right hip 06/15/2021   Preoperative clearance 05/04/2021   Wellness examination 03/14/2021   Valvular heart disease 12/23/2020   Dyslipidemia 12/23/2020   Greater trochanteric pain syndrome of right lower extremity 12/23/2020   Stenosis of left subclavian artery (HCC) 04/02/2020   Aortic stenosis 04/02/2020   Pacemaker 01/05/2020   Complete heart block (HCC) 09/28/2019   Syncope 09/08/2019   Hyperlipidemia 04/22/2019   Bilateral carotid bruits 12/24/2018   PAD (peripheral artery disease) (HCC) 10/27/2018   Hypertension 10/27/2018   CAD (coronary artery disease) 10/27/2018   NSTEMI (non-ST elevated myocardial infarction) 10/27/2018   Acute back pain 10/27/2018    REFERRING PROVIDER: de [place], Raymond J, MD  REFERRING DIAG: [phone] (ICD-10-CM) - Sciatica of left side  Rationale for Evaluation and Treatment: Rehabilitation  THERAPY DIAG:  Pain in right leg  Pain in left leg  Muscle weakness (generalized)  Difficulty in walking, not elsewhere classified  ONSET DATE: 07/03/23  SUBJECTIVE:  SUBJECTIVE STATEMENT:  Pt states that the pain is better. The pain is no longer at rest with the occasional "twinge" of pain.    PERTINENT HISTORY:  Pt has a PACEMAKER-- NO E-STIM. R THA with anterior approach 03/23 HTN, PAD, arthritis CAD with Cardiac surgery with 2 stents Pt had a procedure to clean out blockage in carotid artery in December.  Pt uses a cane occasionally due to being light-headed because of allergies.    PAIN:  NPRS:  0/10 current, 7-8/10 worst Location:  bilat buttocks down bilat LE's.  Sometimes just in bilat calves.  Pt states he  hasn't had LBP. Type:  Intermittent, sharp, stabbing.  Pt denies N/T.  Easing Factors:  standing Aggravating factors:  occasionally bending  PRECAUTIONS: ICD/Pacemaker and Other: R THA with anterior approach in 2023  RED FLAGS: None   WEIGHT BEARING RESTRICTIONS: No  FALLS:  Has patient fallen in last 6 months? Yes. Number of falls 2 falls in 1 week about 3 months ago  LIVING ENVIRONMENT: Lives with: lives with their spouse Lives in: 3 story home Stairs: yes Has following equipment at home: Single point cane and Environmental consultant - 2 wheeled   PLOF: Independent.  Pt uses a cane occasionally due to being light-headed because of allergies.  PATIENT GOALS: to decrease pain and improve walking  NEXT MD VISIT: walk better and decrease pain  OBJECTIVE:  Note: Objective measures were completed at Evaluation unless otherwise noted.  DIAGNOSTIC FINDINGS:  Pt has had no imaging performed.   PATIENT SURVEYS:  Modified Oswestry 24%   COGNITION: Overall cognitive status: Within functional limits for tasks assessed       MUSCLE LENGTH: Hamstrings: tightness in bilat HS   LUMBAR ROM:   AROM eval  Flexion   Extension   Right lateral flexion   Left lateral flexion   Right rotation   Left rotation    (Blank rows = not tested)  LOWER EXTREMITY MMT:    MMT Right eval Left eval  Hip flexion Weak, Tolerates slight resistance 4+/5  Hip extension    Hip abduction 28.1 ; Pt has difficulty performing S/L hip abduction 27.0 ;  Pt has difficulty performing S/L hip abduction  Hip adduction    Hip internal rotation    Hip external rotation    Knee flexion 5/5 seated 5/5 seated  Knee extension 5/5 5/5  Ankle dorsiflexion    Ankle plantarflexion    Ankle inversion    Ankle eversion     (Blank rows = not tested)  LUMBAR SPECIAL TESTS:  SLR test:  positive bilat Slump test:  positive bilat   GAIT: Assistive device utilized: None Level of assistance: Independent Comments:  decreased toe off bilat, bilat toe off, leans to R with gait.  Pt has a limp with gait.   TREATMENT:     5/14   Nustep Lvl 6 2.5 min; lvl 3 2.5 min  Hip hinge at table 10x Seated quadratus stretch in figure 4 30s 2x each YTB paloff 2x10 ea way Fwd flexion with swissball 2x10 standing Childs pose on table 3s 10x Seated figure 4 30s 2x  Previous:  Reviewed prior HEP from home health:  Pt was performing marching, standing hip abd, standing hip extension, standing HS curls, heel/toe raises, and squats with UE support on counter.   LAQ with AP's (10) x 2 sets bilat Seated nn flossing x10 Supine PPT 2x10 Supine HS stretch with strap 2x30 sec bilat PT educated pt with correct form and palpation of TrA contraction.  Pt performed TrA contraction in hooklying. Updated HEP and gave pt a HEP handout.  PT educated pt in correct form and appropriate frequency.  Pt received manual HS stretch with passive AP's for nn flossing 2 sets bilat    PATIENT EDUCATION:  Education details: dx, HEP, POC, exercise form, HEP, rationale of interventions, relevant anatomy, and what to expect next treatment.  PT answered pt's questions. Person educated: Patient Education method: Explanation, Demonstration, Verbal cues, and Handouts Education comprehension: verbalized understanding, returned demonstration, verbal cues required, and needs further education  HOME EXERCISE PROGRAM: Access Code: [email or web address] URL: [email or web address] Date: 08/06/2023 Prepared by: Marnie Siren  Updated HEP: - Supine Transversus Abdominis Bracing - Hands on Stomach  - 2 x daily - 7 x weekly - 2 sets - 10 reps  ASSESSMENT:  CLINICAL IMPRESSION: Pt with continued relief in pain from therapy session. HEP program updated today with progressive hip stretching as well as progressive adominal strength  in standing. No pain noted during session. Pt doing well and able to drop frequency to 1x/every other week at this time. Plan to progress standing hip and "core" exercise as tolerated. Consider banded sidesteps or DL/RDL type motion for home.    OBJECTIVE IMPAIRMENTS: Abnormal gait, decreased activity tolerance, decreased mobility, difficulty walking, decreased strength, impaired flexibility, and pain.   ACTIVITY LIMITATIONS: locomotion level  PARTICIPATION LIMITATIONS: community activity  PERSONAL FACTORS: Age and 3+ comorbidities: Pacemaker, R THA, PAD, CAD are also affecting patient's functional outcome.   REHAB POTENTIAL: Good  CLINICAL DECISION MAKING: Stable/uncomplicated  EVALUATION COMPLEXITY: Low   GOALS:  SHORT TERM GOALS: Target date: 08/27/2023   Pt will be independent and compliant with HEP for improved pain, strength, and function.   Baseline: Goal status: INITIAL  2.  Pt will report at least a 25% improvement in pain and sx's overall.   Baseline:  Goal status: INITIAL  3.  Pt's worst pain will be <6/10 for improved mobility and ambulation.   Baseline:  Goal status: INITIAL Target date:  09/03/2023    LONG TERM GOALS: Target date: 09/17/2023  Pt will be able to perform his normal community ambulation without increased pain.   Baseline:  Goal status: INITIAL  2.  Pt will be able to walk outdoors with improved confidence and without significant pain. Baseline:  Goal status: INITIAL  3.  Pt will report at least a 70% improvement in pain and sx's overall.   Baseline:  Goal status: INITIAL    PLAN:  PT FREQUENCY: 1-2x/wk, primarily once per week  PT DURATION: 6 weeks  PLANNED INTERVENTIONS: 97164- PT Re-evaluation, 97750- Physical Performance Testing, 97110-Therapeutic exercises, 97530- Therapeutic activity, 97112- Neuromuscular re-education, 97535- Self Care, 54098- Manual therapy, [phone]- Gait training, [phone]- Aquatic Therapy, [phone]- Ultrasound,  Patient/Family education, Balance training, Stair training, Taping, Dry Needling, Joint mobilization, Spinal mobilization, DME instructions, Cryotherapy, and Moist heat.  PLAN FOR NEXT SESSION:  Review HEP from HHPT.  Cont with neural flossing techniques.  Core and LE strengthening.   Pt has a PACEMAKER-- NO E-STIM.  Silver Dross PT, DPT 08/29/23 1:50 PM

## 2023-08-30 ENCOUNTER — Encounter (HOSPITAL_BASED_OUTPATIENT_CLINIC_OR_DEPARTMENT_OTHER): Payer: Self-pay | Admitting: Family Medicine

## 2023-08-30 ENCOUNTER — Ambulatory Visit (HOSPITAL_BASED_OUTPATIENT_CLINIC_OR_DEPARTMENT_OTHER): Admitting: Family Medicine

## 2023-08-30 VITALS — BP 131/76 | HR 84 | Ht 71.0 in | Wt 213.7 lb

## 2023-08-30 DIAGNOSIS — M5432 Sciatica, left side: Secondary | ICD-10-CM | POA: Diagnosis not present

## 2023-08-30 DIAGNOSIS — I10 Essential (primary) hypertension: Secondary | ICD-10-CM

## 2023-08-30 NOTE — Progress Notes (Signed)
    Procedures performed today:    None.  Independent interpretation of notes and tests performed by another provider:   None.  Brief History, Exam, Impression, and Recommendations:    BP 131/76 (BP Location: Left Arm, Patient Position: Sitting, Cuff Size: Normal)   Pulse 84   Ht 5\' 11"  (1.803 m)   Wt 213 lb 11.2 oz (96.9 kg)   SpO2 97%   BMI 29.81 kg/m   Sciatica of left side Assessment & Plan: Patient has been working with physical therapy and has had good response with this.  He has been doing home exercises as per PT.  Symptoms are largely resolved at this time. Recommend continuing with home exercises regimen as per PT.  Discussed that it is possible for symptoms to recur in the future and if so, could consider resuming physical therapy for management.  For now, we will continue to monitor   Primary hypertension Assessment & Plan: Blood pressure appropriate in office today.  No concerns related to chest pain or headaches.  Has not been checking blood pressure at home given that blood pressure has been well-controlled recently.  He continues with medications as prescribed, no concerns currently. No changes to medication regimen today.  We will plan to follow-up in about 6 months or sooner as needed.   Return in about 6 months (around 03/01/2024) for hyperlipidemia, hypertension.   ___________________________________________ Edinson Domeier de [place], MD, ABFM, CAQSM Primary Care and Sports Medicine [facility]

## 2023-08-30 NOTE — Assessment & Plan Note (Signed)
 Patient has been working with physical therapy and has had good response with this.  He has been doing home exercises as per PT.  Symptoms are largely resolved at this time. Recommend continuing with home exercises regimen as per PT.  Discussed that it is possible for symptoms to recur in the future and if so, could consider resuming physical therapy for management.  For now, we will continue to monitor

## 2023-08-30 NOTE — Assessment & Plan Note (Signed)
 Blood pressure appropriate in office today.  No concerns related to chest pain or headaches.  Has not been checking blood pressure at home given that blood pressure has been well-controlled recently.  He continues with medications as prescribed, no concerns currently. No changes to medication regimen today.  We will plan to follow-up in about 6 months or sooner as needed.

## 2023-08-30 NOTE — Patient Instructions (Signed)

## 2023-09-04 ENCOUNTER — Other Ambulatory Visit: Payer: Self-pay | Admitting: *Deleted

## 2023-09-04 DIAGNOSIS — I739 Peripheral vascular disease, unspecified: Secondary | ICD-10-CM

## 2023-09-05 ENCOUNTER — Ambulatory Visit (HOSPITAL_BASED_OUTPATIENT_CLINIC_OR_DEPARTMENT_OTHER): Admitting: Physical Therapy

## 2023-09-12 ENCOUNTER — Ambulatory Visit (HOSPITAL_COMMUNITY)

## 2023-09-13 ENCOUNTER — Encounter (HOSPITAL_BASED_OUTPATIENT_CLINIC_OR_DEPARTMENT_OTHER): Payer: Self-pay | Admitting: Physical Therapy

## 2023-09-13 ENCOUNTER — Ambulatory Visit (HOSPITAL_BASED_OUTPATIENT_CLINIC_OR_DEPARTMENT_OTHER): Admitting: Physical Therapy

## 2023-09-13 DIAGNOSIS — M6281 Muscle weakness (generalized): Secondary | ICD-10-CM

## 2023-09-13 DIAGNOSIS — R262 Difficulty in walking, not elsewhere classified: Secondary | ICD-10-CM

## 2023-09-13 DIAGNOSIS — M79604 Pain in right leg: Secondary | ICD-10-CM | POA: Diagnosis not present

## 2023-09-13 DIAGNOSIS — M79605 Pain in left leg: Secondary | ICD-10-CM

## 2023-09-13 NOTE — Therapy (Signed)
 OUTPATIENT PHYSICAL THERAPY THORACOLUMBAR TREATMENT   Patient Name: Glenn Collins MRN: [id number] DOB:05-24-1945, 78 y.o., male Today's Date: 09/14/2023  END OF SESSION:  PT End of Session - 09/13/23 1157     Visit Number 5    Number of Visits 7    Date for PT Re-Evaluation 09/17/23    Authorization Type BCBS MCR    PT Start Time 1155    PT Stop Time 1234    PT Time Calculation (min) 39 min    Activity Tolerance Patient tolerated treatment well    Behavior During Therapy [facility] for tasks assessed/performed              Past Medical History:  Diagnosis Date   Arthritis    CAD (coronary artery disease) 10/27/2018   Carotid artery occlusion    Coronary artery disease    Heart murmur Since childhood   History of kidney stones 2021   Hypertension    PAD (peripheral artery disease) (HCC) 10/27/2018   Presence of permanent cardiac pacemaker    Biotronik PPM   Past Surgical History:  Procedure Laterality Date   CARDIAC SURGERY  2019   with stents x2   ENDARTERECTOMY Left 03/27/2023   Procedure: ENDARTERECTOMY CAROTID;  Surgeon: Adine Hoof, MD;  Location: [facility] OR;  Service: Vascular;  Laterality: Left;   HERNIA REPAIR  2012   PACEMAKER IMPLANT N/A 09/29/2019   Procedure: PACEMAKER IMPLANT;  Surgeon: Tammie Fall, MD;  Location: [facility] INVASIVE CV LAB;  Service: Cardiovascular;  Laterality: N/A;   PATCH ANGIOPLASTY Left 03/27/2023   Procedure: Corinna Dickens PATCH ANGIOPLASTY;  Surgeon: Adine Hoof, MD;  Location: [facility] OR;  Service: Vascular;  Laterality: Left;   TOTAL HIP ARTHROPLASTY Right 06/15/2021   Procedure: TOTAL HIP ARTHROPLASTY ANTERIOR APPROACH;  Surgeon: Adonica Hoose, MD;  Location: [facility] ORS;  Service: Orthopedics;  Laterality: Right;   Patient Active Problem List   Diagnosis Date Noted   Sciatica of left side 07/10/2023   Carotid artery stenosis 03/27/2023   Carotid stenosis, asymptomatic, left 03/27/2023   Abscess 03/15/2022    Environmental and seasonal allergies 01/10/2022   DVT (deep venous thrombosis) (HCC) 08/11/2021   Lower extremity edema 07/08/2021   Osteoarthritis of right hip 06/15/2021   Preoperative clearance 05/04/2021   Wellness examination 03/14/2021   Valvular heart disease 12/23/2020   Dyslipidemia 12/23/2020   Greater trochanteric pain syndrome of right lower extremity 12/23/2020   Stenosis of left subclavian artery (HCC) 04/02/2020   Aortic stenosis 04/02/2020   Pacemaker 01/05/2020   Complete heart block (HCC) 09/28/2019   Syncope 09/08/2019   Hyperlipidemia 04/22/2019   Bilateral carotid bruits 12/24/2018   PAD (peripheral artery disease) (HCC) 10/27/2018   Hypertension 10/27/2018   CAD (coronary artery disease) 10/27/2018   NSTEMI (non-ST elevated myocardial infarction) 10/27/2018   Acute back pain 10/27/2018    REFERRING PROVIDER: de [place], Raymond J, MD  REFERRING DIAG: [phone] (ICD-10-CM) - Sciatica of left side  Rationale for Evaluation and Treatment: Rehabilitation  THERAPY DIAG:  Pain in right leg  Pain in left leg  Muscle weakness (generalized)  Difficulty in walking, not elsewhere classified  ONSET DATE: 07/03/23  SUBJECTIVE:  SUBJECTIVE STATEMENT:  Pt states he is doing better.  He denies pain currently.  Pt denies any adverse effects after prior Rx.  Pt is not having the sharp pains in his legs.  Occasionally he gets a twinge in his L calf though doesn't hurt.  Pt states the seated nerve flossing exercise helps a lot.  Pt reports he performed some of his new HEP.     PERTINENT HISTORY:  Pt has a PACEMAKER-- NO E-STIM. R THA with anterior approach 03/23 HTN, PAD, arthritis CAD with Cardiac surgery with 2 stents Pt had a procedure to clean out blockage in carotid artery in  December.  Pt uses a cane occasionally due to being light-headed because of allergies.    PAIN:  NPRS:  0/10 current, 7-8/10 worst Location:  bilat buttocks down bilat LE's.  Sometimes just in bilat calves.  Pt states he hasn't had LBP. Type:  Intermittent, sharp, stabbing.  Pt denies N/T.  Easing Factors:  standing Aggravating factors:  occasionally bending  PRECAUTIONS: ICD/Pacemaker and Other: R THA with anterior approach in 2023  RED FLAGS: None   WEIGHT BEARING RESTRICTIONS: No  FALLS:  Has patient fallen in last 6 months? Yes. Number of falls 2 falls in 1 week about 3 months ago  LIVING ENVIRONMENT: Lives with: lives with their spouse Lives in: 3 story home Stairs: yes Has following equipment at home: Single point cane and Environmental consultant - 2 wheeled   PLOF: Independent.  Pt uses a cane occasionally due to being light-headed because of allergies.  PATIENT GOALS: to decrease pain and improve walking  NEXT MD VISIT: walk better and decrease pain  OBJECTIVE:  Note: Objective measures were completed at Evaluation unless otherwise noted.  DIAGNOSTIC FINDINGS:  Pt has had no imaging performed.   PATIENT SURVEYS:  Modified Oswestry 24%   COGNITION: Overall cognitive status: Within functional limits for tasks assessed       MUSCLE LENGTH: Hamstrings: tightness in bilat HS   LUMBAR ROM:   AROM eval  Flexion   Extension   Right lateral flexion   Left lateral flexion   Right rotation   Left rotation    (Blank rows = not tested)  LOWER EXTREMITY MMT:    MMT Right eval Left eval  Hip flexion Weak, Tolerates slight resistance 4+/5  Hip extension    Hip abduction 28.1 ; Pt has difficulty performing S/L hip abduction 27.0 ;  Pt has difficulty performing S/L hip abduction  Hip adduction    Hip internal rotation    Hip external rotation    Knee flexion 5/5 seated 5/5 seated  Knee extension 5/5 5/5  Ankle dorsiflexion    Ankle plantarflexion    Ankle inversion     Ankle eversion     (Blank rows = not tested)  LUMBAR SPECIAL TESTS:  SLR test:  positive bilat Slump test:  positive bilat   GAIT: Assistive device utilized: None Level of assistance: Independent Comments: decreased toe off bilat, bilat toe off, leans to R with gait.  Pt has a limp with gait.   TREATMENT:     5/29 Reviewed pt presentation, HEP compliance, pain level, and response to prior Rx.   Nustep L4-5 x 5 mins UE/Les Seated QL stretch 2x25-30 sec each bilat  PT educated pt with correct form and palpation of TrA contraction.  Pt performed TrA contraction in hooklying. Supine marching with TrA x 10 reps Supine alt UE/LE with TrA Supine HS stretch with strap 2x30  sec bilat Pt received manual HS stretch with passive AP's for nn flossing 2 sets bilat  YTB paloff with TrA 2x10 ea way Sidestepping at rail with light UE support at rail x 3 laps Supine heel slide on R, LE ext on L with TrA x 10 each    5/14   Nustep Lvl 6 2.5 min; lvl 3 2.5 min  Hip hinge at table 10x Seated quadratus stretch in figure 4 30s 2x each YTB paloff 2x10 ea way Fwd flexion with swissball 2x10 standing Childs pose on table 3s 10x Seated figure 4 30s 2x  Previous:                                                                                                                            Reviewed prior HEP from home health:  Pt was performing marching, standing hip abd, standing hip extension, standing HS curls, heel/toe raises, and squats with UE support on counter.   LAQ with AP's (10) x 2 sets bilat Seated nn flossing x10 Supine PPT 2x10 Supine HS stretch with strap 2x30 sec bilat PT educated pt with correct form and palpation of TrA contraction.  Pt performed TrA contraction in hooklying. Updated HEP and gave pt a HEP handout.  PT educated pt in correct form and appropriate frequency.  Pt received manual HS stretch with passive AP's for nn flossing 2 sets bilat    PATIENT EDUCATION:   Education details: dx, HEP, POC, exercise form, HEP, rationale of interventions, relevant anatomy, and what to expect next treatment.  PT answered pt's questions. Person educated: Patient Education method: Explanation, Demonstration, Verbal cues, and Handouts Education comprehension: verbalized understanding, returned demonstration, verbal cues required, and needs further education  HOME EXERCISE PROGRAM: Access Code: [email or web address] URL: [email or web address] Date: 08/06/2023 Prepared by: Marnie Siren    ASSESSMENT:  CLINICAL IMPRESSION: Pt has made great progress with sx's as evidenced by subjective reports.  Pt is not having the sharp pains in his legs.  He reports improved sx's with nn flossing home exercise.  Pt worked on core strengthening, flexibility, and neural flossing.  He performed exercises well with cuing for correct form.  Pt attempted supine alt LE extension though had pain in R thigh and no pain in L LE.  Pt able to perform LE extension on L LE and performed heel slide on R with TrA.  Pt responded well to Rx having no pain and no c/o's after Rx.  Pt should be ready for discharge next visit.   OBJECTIVE IMPAIRMENTS: Abnormal gait, decreased activity tolerance, decreased mobility, difficulty walking, decreased strength, impaired flexibility, and pain.   ACTIVITY LIMITATIONS: locomotion level  PARTICIPATION LIMITATIONS: community activity  PERSONAL FACTORS: Age and 3+ comorbidities: Pacemaker, R THA, PAD, CAD are also affecting patient's functional outcome.   REHAB POTENTIAL: Good  CLINICAL DECISION MAKING: Stable/uncomplicated  EVALUATION COMPLEXITY: Low   GOALS:  SHORT TERM GOALS: Target date: 08/27/2023   Pt will be independent  and compliant with HEP for improved pain, strength, and function.   Baseline: Goal status: INITIAL  2.  Pt will report at least a 25% improvement in pain and sx's overall.   Baseline:  Goal status: INITIAL  3.  Pt's  worst pain will be <6/10 for improved mobility and ambulation.   Baseline:  Goal status: INITIAL Target date:  09/03/2023    LONG TERM GOALS: Target date: 09/17/2023  Pt will be able to perform his normal community ambulation without increased pain.   Baseline:  Goal status: INITIAL  2.  Pt will be able to walk outdoors with improved confidence and without significant pain. Baseline:  Goal status: INITIAL  3.  Pt will report at least a 70% improvement in pain and sx's overall.   Baseline:  Goal status: INITIAL    PLAN:  PT FREQUENCY: 1-2x/wk, primarily once per week  PT DURATION: 6 weeks  PLANNED INTERVENTIONS: 97164- PT Re-evaluation, 97750- Physical Performance Testing, 97110-Therapeutic exercises, 97530- Therapeutic activity, 97112- Neuromuscular re-education, 97535- Self Care, 16109- Manual therapy, [phone]- Gait training, [phone]- Aquatic Therapy, [phone]- Ultrasound, Patient/Family education, Balance training, Stair training, Taping, Dry Needling, Joint mobilization, Spinal mobilization, DME instructions, Cryotherapy, and Moist heat.  PLAN FOR NEXT SESSION:  Review HEP from HHPT.  Cont with neural flossing techniques.  Core and LE strengthening.  Probable discharge next treatment.    Pt has a PACEMAKER-- NO E-STIM.  Trina Fujita III PT, DPT 09/14/23 5:53 PM

## 2023-09-19 ENCOUNTER — Encounter (HOSPITAL_BASED_OUTPATIENT_CLINIC_OR_DEPARTMENT_OTHER): Admitting: Physical Therapy

## 2023-09-25 ENCOUNTER — Encounter (HOSPITAL_BASED_OUTPATIENT_CLINIC_OR_DEPARTMENT_OTHER): Payer: Medicare Other

## 2023-09-26 ENCOUNTER — Encounter (HOSPITAL_BASED_OUTPATIENT_CLINIC_OR_DEPARTMENT_OTHER): Payer: Self-pay | Admitting: Physical Therapy

## 2023-09-26 ENCOUNTER — Ambulatory Visit (HOSPITAL_BASED_OUTPATIENT_CLINIC_OR_DEPARTMENT_OTHER): Attending: Family Medicine | Admitting: Physical Therapy

## 2023-09-26 DIAGNOSIS — M6281 Muscle weakness (generalized): Secondary | ICD-10-CM | POA: Insufficient documentation

## 2023-09-26 DIAGNOSIS — M79604 Pain in right leg: Secondary | ICD-10-CM | POA: Insufficient documentation

## 2023-09-26 DIAGNOSIS — M79605 Pain in left leg: Secondary | ICD-10-CM | POA: Insufficient documentation

## 2023-09-26 DIAGNOSIS — R262 Difficulty in walking, not elsewhere classified: Secondary | ICD-10-CM | POA: Diagnosis present

## 2023-09-26 NOTE — Therapy (Signed)
 OUTPATIENT PHYSICAL THERAPY THORACOLUMBAR TREATMENT / DISCHARGE   Patient Name: Glenn Collins MRN: [id number] DOB:04/13/1946, 79 y.o., male Today's Date: 09/27/2023  END OF SESSION:  PT End of Session - 09/26/23 1326     Visit Number 6    Number of Visits 6    Authorization Type BCBS MCR    PT Start Time 1323    PT Stop Time 1401    PT Time Calculation (min) 38 min    Activity Tolerance Patient tolerated treatment well    Behavior During Therapy [facility] for tasks assessed/performed           Past Medical History:  Diagnosis Date   Arthritis    CAD (coronary artery disease) 10/27/2018   Carotid artery occlusion    Coronary artery disease    Heart murmur Since childhood   History of kidney stones 2021   Hypertension    PAD (peripheral artery disease) (HCC) 10/27/2018   Presence of permanent cardiac pacemaker    Biotronik PPM   Past Surgical History:  Procedure Laterality Date   CARDIAC SURGERY  2019   with stents x2   ENDARTERECTOMY Left 03/27/2023   Procedure: ENDARTERECTOMY CAROTID;  Surgeon: Adine Hoof, MD;  Location: [facility] OR;  Service: Vascular;  Laterality: Left;   HERNIA REPAIR  2012   PACEMAKER IMPLANT N/A 09/29/2019   Procedure: PACEMAKER IMPLANT;  Surgeon: Tammie Fall, MD;  Location: [facility] INVASIVE CV LAB;  Service: Cardiovascular;  Laterality: N/A;   PATCH ANGIOPLASTY Left 03/27/2023   Procedure: Corinna Dickens PATCH ANGIOPLASTY;  Surgeon: Adine Hoof, MD;  Location: [facility] OR;  Service: Vascular;  Laterality: Left;   TOTAL HIP ARTHROPLASTY Right 06/15/2021   Procedure: TOTAL HIP ARTHROPLASTY ANTERIOR APPROACH;  Surgeon: Adonica Hoose, MD;  Location: [facility] ORS;  Service: Orthopedics;  Laterality: Right;   Patient Active Problem List   Diagnosis Date Noted   Sciatica of left side 07/10/2023   Carotid artery stenosis 03/27/2023   Carotid stenosis, asymptomatic, left 03/27/2023   Abscess 03/15/2022   Environmental and seasonal allergies  01/10/2022   DVT (deep venous thrombosis) (HCC) 08/11/2021   Lower extremity edema 07/08/2021   Osteoarthritis of right hip 06/15/2021   Preoperative clearance 05/04/2021   Wellness examination 03/14/2021   Valvular heart disease 12/23/2020   Dyslipidemia 12/23/2020   Greater trochanteric pain syndrome of right lower extremity 12/23/2020   Stenosis of left subclavian artery (HCC) 04/02/2020   Aortic stenosis 04/02/2020   Pacemaker 01/05/2020   Complete heart block (HCC) 09/28/2019   Syncope 09/08/2019   Hyperlipidemia 04/22/2019   Bilateral carotid bruits 12/24/2018   PAD (peripheral artery disease) (HCC) 10/27/2018   Hypertension 10/27/2018   CAD (coronary artery disease) 10/27/2018   NSTEMI (non-ST elevated myocardial infarction) 10/27/2018   Acute back pain 10/27/2018    REFERRING PROVIDER: de [place], Raymond J, MD  REFERRING DIAG: [id number] (ICD-10-CM) - Sciatica of left side  Rationale for Evaluation and Treatment: Rehabilitation  THERAPY DIAG:  Pain in right leg  Pain in left leg  Muscle weakness (generalized)  Difficulty in walking, not elsewhere classified  ONSET DATE: 07/03/23  SUBJECTIVE:  SUBJECTIVE STATEMENT:  Pt states he did well after prior Rx and had no pain.  Pt states he is not had any issues since last Rx.  He denies pain currently and has not been having pain.  Pt states he is not getting the twinges anymore.  Pt reports compliance with HEP.  Pt states the seated nerve flossing exercise helps a lot.   Pt reports 100% improvement in pain and sx's overall.  Pt report he is able to perform his normal community ambulation without increased pain.  Pt is able to walk outdoors with improved confidence and without significant pain.   PERTINENT HISTORY:  Pt has a PACEMAKER-- NO  E-STIM. R THA with anterior approach 03/23 HTN, PAD, arthritis CAD with Cardiac surgery with 2 stents Pt had a procedure to clean out blockage in carotid artery in December.  Pt uses a cane occasionally due to being light-headed because of allergies.    PAIN:  NPRS:  0/10 current, 1/10 worst Location:  bilat buttocks down bilat LE's.  Sometimes just in bilat calves.  Pt states he hasn't had LBP. Type:  Intermittent, sharp, stabbing.  Pt denies N/T.  Easing Factors:  standing Aggravating factors:  occasionally bending  PRECAUTIONS: ICD/Pacemaker and Other: R THA with anterior approach in 2023  RED FLAGS: None   WEIGHT BEARING RESTRICTIONS: No  FALLS:  Has patient fallen in last 6 months? Yes. Number of falls 2 falls in 1 week about 3 months ago  LIVING ENVIRONMENT: Lives with: lives with their spouse Lives in: 3 story home Stairs: yes Has following equipment at home: Single point cane and Environmental consultant - 2 wheeled   PLOF: Independent.  Pt uses a cane occasionally due to being light-headed because of allergies.  PATIENT GOALS: to decrease pain and improve walking  NEXT MD VISIT: walk better and decrease pain  OBJECTIVE:  Note: Objective measures were completed at Evaluation unless otherwise noted.  DIAGNOSTIC FINDINGS:  Pt has had no imaging performed.   PATIENT SURVEYS:  Modified Oswestry Initial/Current:  24% / 6%     TREATMENT:     6/11  Reviewed current function, goals, pain levels, and response to prior Rx. Reviewed and updated HEP.  Nustep x 5 mins L5 UE/LE's Sidestepping x 4 laps at rail YTB paloff x12 reps and RTB x10 reps with TrA Supine heel slide on R with TrA and LE ext on L with TrA x2x10 reps each Supine alt UE/LE 2x10     PATIENT EDUCATION:  Education details: dx, HEP, POC, exercise form, HEP, rationale of interventions, relevant anatomy, and what to expect next treatment.  PT answered pt's questions. Person educated: Patient Education method:  Explanation, Demonstration, Verbal cues, and Handouts Education comprehension: verbalized understanding, returned demonstration, verbal cues required, and needs further education  HOME EXERCISE PROGRAM: Access Code: [email or web address] URL: [email or web address] Date: 08/06/2023 Prepared by: Marnie Siren  Updated HEP: - Side Stepping with Counter Support  - 1 x daily - 5-6 x weekly - 3 reps    ASSESSMENT:  CLINICAL IMPRESSION: Pt has made excellent progress in PT.  He reports 100% improvement in pain and sx's overall.  Pt report he is able to perform his normal community ambulation without increased pain.  PT reviewed and updated HEP.  PT instructed pt in correct exercises, exercise form, proper sets/reps, and appropriate frequency.  Pt is independent with HEP.  Pt demonstrates clinically significant improvement in self perceived disability with modified oswestry improving from 24% to 6%.  Pt  has met all goals and is ready for discharge.     OBJECTIVE IMPAIRMENTS: Abnormal gait, decreased activity tolerance, decreased mobility, difficulty walking, decreased strength, impaired flexibility, and pain.   ACTIVITY LIMITATIONS: locomotion level  PARTICIPATION LIMITATIONS: community activity  PERSONAL FACTORS: Age and 3+ comorbidities: Pacemaker, R THA, PAD, CAD are also affecting patient's functional outcome.   REHAB POTENTIAL: Good  CLINICAL DECISION MAKING: Stable/uncomplicated  EVALUATION COMPLEXITY: Low   GOALS:  SHORT TERM GOALS: Target date: 08/27/2023   Pt will be independent and compliant with HEP for improved pain, strength, and function.   Baseline: Goal status:   GOAL MET  6/11  2.  Pt will report at least a 25% improvement in pain and sx's overall.   Baseline:  Goal status:  GOAL MET  6/11  3.  Pt's worst pain will be <6/10 for improved mobility and ambulation.   Baseline:  Goal status:   GOAL MET  6/11 Target date:  09/03/2023    LONG TERM GOALS: Target  date: 09/17/2023  Pt will be able to perform his normal community ambulation without increased pain.   Baseline:  Goal status:   GOAL MET  6/11  2.  Pt will be able to walk outdoors with improved confidence and without significant pain. Baseline:  Goal status:   GOAL MET  6/11  3.  Pt will report at least a 70% improvement in pain and sx's overall.   Baseline:  Goal status:   GOAL MET  6/11    PLAN:   PLANNED INTERVENTIONS: 97164- PT Re-evaluation, 97750- Physical Performance Testing, 97110-Therapeutic exercises, 97530- Therapeutic activity, 97112- Neuromuscular re-education, 97535- Self Care, 16109- Manual therapy, [phone]- Gait training, [phone]- Aquatic Therapy, [phone]- Ultrasound, Patient/Family education, Balance training, Stair training, Taping, Dry Needling, Joint mobilization, Spinal mobilization, DME instructions, Cryotherapy, and Moist heat.  PLAN FOR NEXT SESSION:  Pt to be discharged from skilled PT due to meeting all goals.  Pt is agreeable with discharge.  He will cont with HEP. Pt has a PACEMAKER-- NO E-STIM.  PHYSICAL THERAPY DISCHARGE SUMMARY  Visits from Start of Care: 6  Current functional level related to goals / functional outcomes: See above   Remaining deficits: See above   Education / Equipment: See above     Trina Fujita III PT, DPT 09/27/23 11:50 PM

## 2023-09-27 ENCOUNTER — Ambulatory Visit (INDEPENDENT_AMBULATORY_CARE_PROVIDER_SITE_OTHER): Payer: Medicare Other

## 2023-09-27 DIAGNOSIS — I442 Atrioventricular block, complete: Secondary | ICD-10-CM

## 2023-09-28 LAB — CUP PACEART REMOTE DEVICE CHECK
Date Time Interrogation Session: 20250612143708
Implantable Lead Connection Status: 753985
Implantable Lead Connection Status: 753985
Implantable Lead Implant Date: 20210614
Implantable Lead Implant Date: 20210614
Implantable Lead Location: 753859
Implantable Lead Location: 753860
Implantable Lead Model: 377171
Implantable Lead Model: 377171
Implantable Lead Serial Number: 7000244971
Implantable Lead Serial Number: 7000271886
Implantable Pulse Generator Implant Date: 20210614
Pulse Gen Model: 407145
Pulse Gen Serial Number: 69870190

## 2023-09-30 ENCOUNTER — Ambulatory Visit: Payer: Self-pay | Admitting: Internal Medicine

## 2023-10-02 ENCOUNTER — Ambulatory Visit (HOSPITAL_COMMUNITY)
Admission: RE | Admit: 2023-10-02 | Discharge: 2023-10-02 | Disposition: A | Discharge status: Home / Self Care | Source: Ambulatory Visit | Attending: Cardiovascular Disease | Admitting: Cardiovascular Disease

## 2023-10-02 DIAGNOSIS — I10 Essential (primary) hypertension: Secondary | ICD-10-CM | POA: Diagnosis present

## 2023-10-02 DIAGNOSIS — I350 Nonrheumatic aortic (valve) stenosis: Secondary | ICD-10-CM

## 2023-10-02 DIAGNOSIS — E782 Mixed hyperlipidemia: Secondary | ICD-10-CM | POA: Insufficient documentation

## 2023-10-02 DIAGNOSIS — I2510 Atherosclerotic heart disease of native coronary artery without angina pectoris: Secondary | ICD-10-CM | POA: Diagnosis present

## 2023-10-02 LAB — ECHOCARDIOGRAM COMPLETE
AR max vel: 1.62 cm2
AV Area VTI: 1.48 cm2
AV Area mean vel: 1.4 cm2
AV Mean grad: 30 mmHg
AV Peak grad: 48.7 mmHg
Ao pk vel: 3.49 m/s
Calc EF: 45.8 %
P 1/2 time: 852 ms
S' Lateral: 2.9 cm
Single Plane A2C EF: 49.1 %
Single Plane A4C EF: 42.2 %

## 2023-10-03 ENCOUNTER — Ambulatory Visit: Payer: Self-pay | Admitting: Cardiovascular Disease

## 2023-10-07 ENCOUNTER — Other Ambulatory Visit: Payer: Self-pay | Admitting: Cardiovascular Disease

## 2023-10-17 ENCOUNTER — Encounter: Payer: Self-pay | Admitting: Cardiovascular Disease

## 2023-10-17 ENCOUNTER — Ambulatory Visit: Attending: Cardiovascular Disease | Admitting: Cardiovascular Disease

## 2023-10-17 VITALS — BP 154/83 | HR 93 | Resp 16 | Ht 71.0 in

## 2023-10-17 DIAGNOSIS — I519 Heart disease, unspecified: Secondary | ICD-10-CM | POA: Diagnosis not present

## 2023-10-17 MED ORDER — METOPROLOL SUCCINATE ER 50 MG PO TB24: 50.0000 mg | ORAL_TABLET | Freq: Every day | ORAL | 3 refills | Status: AC

## 2023-10-17 NOTE — Progress Notes (Signed)
 Mr. Edmondson returns today for follow-up of a 2D echocardiogram performed 10/02/2023.  This revealed a decline in his EF from 50 to 55% a year before to 40 to 45%.  Completely asymptomatic.  Mitral valve appears unchanged in severity without mean gradient that has remained stable.  He is completely asymptomatic.  He does have a paced rhythm.  I am going to increase his Toprol -XL 25 to 50 mg and refer him to the advanced heart failure clinic for medication optimization for LV dysfunction.  It is possible that this may be related to his pacer.  Other possibilities include progression of CAD although he is completely asymptomatic.  I will see him back in 6 months for follow-up.  Dorn DOROTHA Lesches, M.D., FACP, [facility], FAHA, [facility]  [address] [place], [place]  [address]  [phone] 10/17/2023 3:05 PM

## 2023-10-17 NOTE — Patient Instructions (Addendum)
 Medication Instructions:  Please increase Metoprolol  Succinate to 50 mg daily. Continue all other medications as listed.  *If you need a refill on your cardiac medications before your next appointment, please call your pharmacy*  You have been referred to the Advance Heart Failure Clinic and will be contacted to be scheduled.  Follow-Up: At [facility], you and your health needs are our priority.  As part of our continuing mission to provide you with exceptional heart care, our providers are all part of one team.  This team includes your primary Cardiologist (physician) and Advanced Practice Providers or APPs (Physician Assistants and Nurse Practitioners) who all work together to provide you with the care you need, when you need it.  Your next appointment:   6 month(s)  Provider:   Dorn Lesches, MD    We recommend signing up for the patient portal called MyChart.  Sign up information is provided on this After Visit Summary.  MyChart is used to connect with patients for Virtual Visits (Telemedicine).  Patients are able to view lab/test results, encounter notes, upcoming appointments, etc.  Non-urgent messages can be sent to your provider as well.   To learn more about what you can do with MyChart, go to [email or web address].

## 2023-11-12 ENCOUNTER — Other Ambulatory Visit: Payer: Self-pay | Admitting: *Deleted

## 2023-11-12 DIAGNOSIS — I739 Peripheral vascular disease, unspecified: Secondary | ICD-10-CM

## 2023-11-13 ENCOUNTER — Encounter (HOSPITAL_BASED_OUTPATIENT_CLINIC_OR_DEPARTMENT_OTHER): Payer: Self-pay

## 2023-11-13 ENCOUNTER — Ambulatory Visit (HOSPITAL_BASED_OUTPATIENT_CLINIC_OR_DEPARTMENT_OTHER): Admitting: *Deleted

## 2023-11-13 VITALS — Ht 71.0 in | Wt 213.0 lb

## 2023-11-13 DIAGNOSIS — Z0000 Encounter for general adult medical examination without abnormal findings: Secondary | ICD-10-CM | POA: Diagnosis not present

## 2023-11-13 NOTE — Patient Instructions (Signed)
 [name] , Thank you for taking time to come for your Medicare Wellness Visit. I appreciate your ongoing commitment to your health goals. Please review the following plan we discussed and let me know if I can assist you in the future.   Screening recommendations/referrals: Colonoscopy: no longer required Recommended yearly ophthalmology/optometry visit for glaucoma screening and checkup Recommended yearly dental visit for hygiene and checkup  Vaccinations: Influenza vaccine: up to date Pneumococcal vaccine: Education provided Tdap vaccine: up to date Shingles vaccine: Education provided      Preventive Care 65 Years and Older, Male Preventive care refers to lifestyle choices and visits with your health care provider that can promote health and wellness. What does preventive care include? A yearly physical exam. This is also called an annual well check. Dental exams once or twice a year. Routine eye exams. Ask your health care provider how often you should have your eyes checked. Personal lifestyle choices, including: Daily care of your teeth and gums. Regular physical activity. Eating a healthy diet. Avoiding tobacco and drug use. Limiting alcohol  use. Practicing safe sex. Taking low doses of aspirin  every day. Taking vitamin and mineral supplements as recommended by your health care provider. What happens during an annual well check? The services and screenings done by your health care provider during your annual well check will depend on your age, overall health, lifestyle risk factors, and family history of disease. Counseling  Your health care provider may ask you questions about your: Alcohol  use. Tobacco use. Drug use. Emotional well-being. Home and relationship well-being. Sexual activity. Eating habits. History of falls. Memory and ability to understand (cognition). Work and work Astronomer. Screening  You may have the following tests or measurements: Height,  weight, and BMI. Blood pressure. Lipid and cholesterol levels. These may be checked every 5 years, or more frequently if you are over 41 years old. Skin check. Lung cancer screening. You may have this screening every year starting at age 11 if you have a 30-pack-year history of smoking and currently smoke or have quit within the past 15 years. Fecal occult blood test (FOBT) of the stool. You may have this test every year starting at age 67. Flexible sigmoidoscopy or colonoscopy. You may have a sigmoidoscopy every 5 years or a colonoscopy every 10 years starting at age 60. Prostate cancer screening. Recommendations will vary depending on your family history and other risks. Hepatitis C blood test. Hepatitis B blood test. Sexually transmitted disease (STD) testing. Diabetes screening. This is done by checking your blood sugar (glucose) after you have not eaten for a while (fasting). You may have this done every 1-3 years. Abdominal aortic aneurysm (AAA) screening. You may need this if you are a current or former smoker. Osteoporosis. You may be screened starting at age 5 if you are at high risk. Talk with your health care provider about your test results, treatment options, and if necessary, the need for more tests. Vaccines  Your health care provider may recommend certain vaccines, such as: Influenza vaccine. This is recommended every year. Tetanus, diphtheria, and acellular pertussis (Tdap, Td) vaccine. You may need a Td booster every 10 years. Zoster vaccine. You may need this after age 3. Pneumococcal 13-valent conjugate (PCV13) vaccine. One dose is recommended after age 37. Pneumococcal polysaccharide (PPSV23) vaccine. One dose is recommended after age 50. Talk to your health care provider about which screenings and vaccines you need and how often you need them. This information is not intended to replace  advice given to you by your health care provider. Make sure you discuss any  questions you have with your health care provider. Document Released: 04/30/2015 Document Revised: 12/22/2015 Document Reviewed: 02/02/2015 Elsevier Interactive Patient Education  2017 [employer].  Fall Prevention in the Home Falls can cause injuries. They can happen to people of all ages. There are many things you can do to make your home safe and to help prevent falls. What can I do on the outside of my home? Regularly fix the edges of walkways and driveways and fix any cracks. Remove anything that might make you trip as you walk through a door, such as a raised step or threshold. Trim any bushes or trees on the path to your home. Use bright outdoor lighting. Clear any walking paths of anything that might make someone trip, such as rocks or tools. Regularly check to see if handrails are loose or broken. Make sure that both sides of any steps have handrails. Any raised decks and porches should have guardrails on the edges. Have any leaves, snow, or ice cleared regularly. Use sand or salt on walking paths during winter. Clean up any spills in your garage right away. This includes oil or grease spills. What can I do in the bathroom? Use night lights. Install grab bars by the toilet and in the tub and shower. Do not use towel bars as grab bars. Use non-skid mats or decals in the tub or shower. If you need to sit down in the shower, use a plastic, non-slip stool. Keep the floor dry. Clean up any water  that spills on the floor as soon as it happens. Remove soap buildup in the tub or shower regularly. Attach bath mats securely with double-sided non-slip rug tape. Do not have throw rugs and other things on the floor that can make you trip. What can I do in the bedroom? Use night lights. Make sure that you have a light by your bed that is easy to reach. Do not use any sheets or blankets that are too big for your bed. They should not hang down onto the floor. Have a firm chair that has side  arms. You can use this for support while you get dressed. Do not have throw rugs and other things on the floor that can make you trip. What can I do in the kitchen? Clean up any spills right away. Avoid walking on wet floors. Keep items that you use a lot in easy-to-reach places. If you need to reach something above you, use a strong step stool that has a grab bar. Keep electrical cords out of the way. Do not use floor polish or wax that makes floors slippery. If you must use wax, use non-skid floor wax. Do not have throw rugs and other things on the floor that can make you trip. What can I do with my stairs? Do not leave any items on the stairs. Make sure that there are handrails on both sides of the stairs and use them. Fix handrails that are broken or loose. Make sure that handrails are as long as the stairways. Check any carpeting to make sure that it is firmly attached to the stairs. Fix any carpet that is loose or worn. Avoid having throw rugs at the top or bottom of the stairs. If you do have throw rugs, attach them to the floor with carpet tape. Make sure that you have a light switch at the top of the stairs and the bottom  of the stairs. If you do not have them, ask someone to add them for you. What else can I do to help prevent falls? Wear shoes that: Do not have high heels. Have rubber bottoms. Are comfortable and fit you well. Are closed at the toe. Do not wear sandals. If you use a stepladder: Make sure that it is fully opened. Do not climb a closed stepladder. Make sure that both sides of the stepladder are locked into place. Ask someone to hold it for you, if possible. Clearly mark and make sure that you can see: Any grab bars or handrails. First and last steps. Where the edge of each step is. Use tools that help you move around (mobility aids) if they are needed. These include: Canes. Walkers. Scooters. Crutches. Turn on the lights when you go into a dark area.  Replace any light bulbs as soon as they burn out. Set up your furniture so you have a clear path. Avoid moving your furniture around. If any of your floors are uneven, fix them. If there are any pets around you, be aware of where they are. Review your medicines with your doctor. Some medicines can make you feel dizzy. This can increase your chance of falling. Ask your doctor what other things that you can do to help prevent falls. This information is not intended to replace advice given to you by your health care provider. Make sure you discuss any questions you have with your health care provider. Document Released: 01/28/2009 Document Revised: 09/09/2015 Document Reviewed: 05/08/2014 Elsevier Interactive Patient Education  2017 [employer].

## 2023-11-13 NOTE — Progress Notes (Signed)
 Subjective:   Glenn Collins is a 78 y.o. male who presents for Medicare Annual/Subsequent preventive examination.  Visit Complete: Virtual I connected with  Glenn Collins on 11/13/23 by a audio enabled telemedicine application and verified that I am speaking with the correct person using two identifiers.  Patient Location: Home  Provider Location: Home Office  I discussed the limitations of evaluation and management by telemedicine. The patient expressed understanding and agreed to proceed.  Vital Signs: Because this visit was a virtual/telehealth visit, some criteria may be missing or patient reported. Any vitals not documented were not able to be obtained and vitals that have been documented are patient reported.   Cardiac Risk Factors include: advanced age (>5men, >61 women);obesity (BMI >30kg/m2);hypertension;male gender     Objective:    Today's Vitals   11/13/23 1017  Weight: 213 lb (96.6 kg)  Height: 5' 11 (1.803 m)   Body mass index is 29.71 kg/m.     11/13/2023   10:16 AM 08/06/2023   11:31 AM 03/27/2023    6:13 AM 03/23/2023   10:30 AM 09/19/2022   10:19 AM 06/15/2021    1:18 PM 06/08/2021    2:12 PM  Advanced Directives  Does Patient Have a Medical Advance Directive? No No No No No No No  Would patient like information on creating a medical advance directive? No - Patient declined No - Patient declined No - Patient declined No - Patient declined No - Patient declined No - Patient declined No - Patient declined    Current Medications (verified) Outpatient Encounter Medications as of 11/13/2023  Medication Sig   aspirin  EC 81 MG tablet Take 81 mg by mouth daily. Swallow whole.   atorvastatin  (LIPITOR ) 80 MG tablet TAKE 1 TABLET BY MOUTH DAILY AT 6:00 IN THE EVENING   chlorthalidone  (HYGROTON ) 25 MG tablet Take 1 tablet (25 mg total) by mouth daily.   diphenhydrAMINE  (BENADRYL ) 25 MG tablet Take 25 mg by mouth at bedtime.   ezetimibe  (ZETIA ) 10 MG tablet Take 1  tablet (10 mg total) by mouth daily.   fluorouracil (EFUDEX) 5 % cream Apply 1 application  topically as needed.   metoprolol  succinate (TOPROL -XL) 50 MG 24 hr tablet Take 1 tablet (50 mg total) by mouth daily. Take with or immediately following a meal.   Multiple Vitamin (MULTIVITAMIN WITH MINERALS) TABS tablet Take 1 tablet by mouth daily.   nitroGLYCERIN  (NITROSTAT ) 0.4 MG SL tablet Place 1 tablet (0.4 mg total) under the tongue every 5 (five) minutes x 3 doses as needed for chest pain.   sodium chloride  (OCEAN) 0.65 % SOLN nasal spray Place 1 spray into both nostrils as needed for congestion.   No facility-administered encounter medications on file as of 11/13/2023.    Allergies (verified) Patient has no known allergies.   History: Past Medical History:  Diagnosis Date   Arthritis    CAD (coronary artery disease) 10/27/2018   Carotid artery occlusion    Coronary artery disease    Heart murmur Since childhood   History of kidney stones 2021   Hypertension    PAD (peripheral artery disease) (HCC) 10/27/2018   Presence of permanent cardiac pacemaker    Biotronik PPM   Past Surgical History:  Procedure Laterality Date   CARDIAC SURGERY  2019   with stents x2   ENDARTERECTOMY Left 03/27/2023   Procedure: ENDARTERECTOMY CAROTID;  Surgeon: Sheree Penne Bruckner, MD;  Location: [facility] OR;  Service: Vascular;  Laterality: Left;   HERNIA REPAIR  2012   PACEMAKER IMPLANT N/A 09/29/2019   Procedure: PACEMAKER IMPLANT;  Surgeon: Waddell Danelle ORN, MD;  Location: [facility] INVASIVE CV LAB;  Service: Cardiovascular;  Laterality: N/A;   PATCH ANGIOPLASTY Left 03/27/2023   Procedure: GEORGE PATCH ANGIOPLASTY;  Surgeon: Sheree Penne Bruckner, MD;  Location: [facility] OR;  Service: Vascular;  Laterality: Left;   TOTAL HIP ARTHROPLASTY Right 06/15/2021   Procedure: TOTAL HIP ARTHROPLASTY ANTERIOR APPROACH;  Surgeon: Fidel Rogue, MD;  Location: [facility] ORS;  Service: Orthopedics;  Laterality: Right;    Family History  Problem Relation Age of Onset   Cancer Mother    Hearing loss Mother    CVA Father    Social History   Socioeconomic History   Marital status: Married    Spouse name: Not on file   Number of children: Not on file   Years of education: Not on file   Highest education level: Doctorate  Occupational History   Not on file  Tobacco Use   Smoking status: Former    Current packs/day: 0.00    Average packs/day: 1.5 packs/day for 40.0 years (60.0 ttl pk-yrs)    Types: Cigarettes    Start date: 10/15/1960    Quit date: 10/15/2000    Years since quitting: 23.0    Passive exposure: Past   Smokeless tobacco: Never  Vaping Use   Vaping status: Never Used  Substance and Sexual Activity   Alcohol  use: Yes    Alcohol /week: 28.0 standard drinks of alcohol     Types: 28 Glasses of wine per week    Comment: 4 glasses per day   Drug use: Never   Sexual activity: Not Currently  Other Topics Concern   Not on file  Social History Narrative   Not on file   Social Drivers of Health   Financial Resource Strain: Low Risk  (11/13/2023)   Overall Financial Resource Strain (CARDIA)    Difficulty of Paying Living Expenses: Not hard at all  Food Insecurity: No Food Insecurity (11/13/2023)   Hunger Vital Sign    Worried About Running Out of Food in the Last Year: Never true    Ran Out of Food in the Last Year: Never true  Transportation Needs: No Transportation Needs (11/13/2023)   PRAPARE - Administrator, Civil Service (Medical): No    Lack of Transportation (Non-Medical): No  Physical Activity: Insufficiently Active (11/13/2023)   Exercise Vital Sign    Days of Exercise per Week: 3 days    Minutes of Exercise per Session: 30 min  Stress: No Stress Concern Present (11/13/2023)   [employer] of Occupational Health - Occupational Stress Questionnaire    Feeling of Stress: Not at all  Social Connections: Moderately Integrated (11/13/2023)   Social Connection and  Isolation Panel    Frequency of Communication with Friends and Family: More than three times a week    Frequency of Social Gatherings with Friends and Family: Three times a week    Attends Religious Services: Never    Active Member of Clubs or Organizations: Yes    Attends Engineer, structural: More than 4 times per year    Marital Status: Married    Tobacco Counseling Counseling given: Not Answered   Clinical Intake:  Pre-visit preparation completed: Yes  Pain : No/denies pain     Diabetes: No  How often do you need to have someone help you when you read instructions, pamphlets, or other written materials from your doctor or pharmacy?: 1 -  Never  Interpreter Needed?: No  Information entered by :: Mliss Graff LPN   Activities of Daily Living    11/13/2023   10:17 AM 03/23/2023   10:35 AM  In your present state of health, do you have any difficulty performing the following activities:  Hearing? 0   Vision? 0   Difficulty concentrating or making decisions? 0   Walking or climbing stairs? 0   Dressing or bathing? 0   Doing errands, shopping? 0 0  Preparing Food and eating ? N   Using the Toilet? N   In the past six months, have you accidently leaked urine? N   Do you have problems with loss of bowel control? N   Managing your Medications? N   Managing your Finances? N   Housekeeping or managing your Housekeeping? N     Patient Care Team: [place], Quintin PARAS, MD as PCP - General (Family Medicine) Court Dorn PARAS, MD as PCP - Cardiology (Cardiology) Waddell Danelle ORN, MD as PCP - Electrophysiology (Cardiology) Sheree Penne Bruckner, MD as Consulting Physician (Vascular Surgery)  Indicate any recent Medical Services you may have received from other than [facility] providers in the past year (date may be approximate).     Assessment:   This is a routine wellness examination for Irvan.  Hearing/Vision screen Hearing Screening - Comments:: No trouble  hearing Vision Screening - Comments:: Not up to date   Goals Addressed             This Visit's Progress    Patient Stated   On track    Patients goal is to remain as active as he currently is.      Patient Stated       Stay in healthy       Depression Screen    11/13/2023   10:18 AM 08/30/2023    1:24 PM 07/10/2023    8:59 AM 01/31/2023   10:26 AM 09/19/2022   10:15 AM 08/29/2022    2:26 PM 05/15/2022   10:28 AM  PHQ 2/9 Scores  PHQ - 2 Score 0 0 0 0 0 0 0  PHQ- 9 Score 0 0 0 0   0  Exception Documentation      Medical reason Medical reason    Fall Risk    11/13/2023   10:14 AM 08/30/2023    1:23 PM 07/10/2023    8:59 AM 01/31/2023   10:26 AM 09/19/2022   10:14 AM  Fall Risk   Falls in the past year? 1 0 0 0 0  Number falls in past yr: 0 0 0 0 0  Injury with Fall? 0 0 0 0 0  Risk for fall due to :  No Fall Risks No Fall Risks No Fall Risks No Fall Risks  Follow up Falls evaluation completed;Education provided;Falls prevention discussed Falls evaluation completed Falls evaluation completed;Education provided;Falls prevention discussed Falls evaluation completed Falls prevention discussed    MEDICARE RISK AT HOME: Medicare Risk at Home Any stairs in or around the home?: Yes If so, are there any without handrails?: No Home free of loose throw rugs in walkways, pet beds, electrical cords, etc?: Yes Adequate lighting in your home to reduce risk of falls?: Yes Life alert?: No Use of a cane, walker or w/c?: No Grab bars in the bathroom?: Yes Shower chair or bench in shower?: No Elevated toilet seat or a handicapped toilet?: Yes  TIMED UP AND GO:  Was the test performed?  No  Cognitive Function:        11/13/2023   10:15 AM 09/19/2022   10:19 AM 08/26/2021   12:58 PM  6CIT Screen  What Year? 0 points 0 points 0 points  What month? 0 points 0 points   What time? 0 points 0 points 0 points  Count back from 20 0 points 0 points 0 points  Months in reverse 0  points 0 points 0 points  Repeat phrase 4 points 2 points 2 points  Total Score 4 points 2 points     Immunizations Immunization History  Administered Date(s) Administered   Fluad Quad(high Dose 65+) 02/10/2021, 02/08/2022, 01/10/2023   Moderna Covid-19 Vaccine  Bivalent Booster 10yrs & up 02/10/2021, 08/19/2021   Moderna SARS-COV2 Booster Vaccination 10/05/2020   PFIZER(Purple Top)SARS-COV-2 Vaccination 05/25/2019, 06/19/2019, 01/31/2020   Pfizer(Comirnaty )Fall Seasonal Vaccine 12 years and older 02/08/2022   Pneumococcal Conjugate-13 03/14/2021   Tdap 08/28/2015   Unspecified SARS-COV-2 Vaccination 01/10/2023    TDAP status: Up to date  Flu Vaccine status: Up to date  Pneumococcal vaccine status: Due, Education has been provided regarding the importance of this vaccine. Advised may receive this vaccine at local pharmacy or Health Dept. Aware to provide a copy of the vaccination record if obtained from local pharmacy or Health Dept. Verbalized acceptance and understanding.  Covid-19 vaccine status: Information provided on how to obtain vaccines.   Qualifies for Shingles Vaccine? Yes   Zostavax completed No   Shingrix  Completed?: No.    Education has been provided regarding the importance of this vaccine. Patient has been advised to call insurance company to determine out of pocket expense if they have not yet received this vaccine. Advised may also receive vaccine at local pharmacy or Health Dept. Verbalized acceptance and understanding.  Screening Tests Health Maintenance  Topic Date Due   Hepatitis C Screening  Never done   Zoster Vaccines- Shingrix  (1 of 2) Never done   Pneumococcal Vaccine: 50+ Years (2 of 2 - PPSV23, PCV20, or PCV21) 05/09/2021   COVID-19 Vaccine (8 - Pfizer risk 2024-25 season) 07/10/2023   INFLUENZA VACCINE  11/16/2023   Medicare Annual Wellness (AWV)  11/12/2024   DTaP/Tdap/Td (2 - Td or Tdap) 08/27/2025   Hepatitis B Vaccines  Aged Out   HPV VACCINES   Aged Out   Meningococcal B Vaccine  Aged Out    Health Maintenance  Health Maintenance Due  Topic Date Due   Hepatitis C Screening  Never done   Zoster Vaccines- Shingrix  (1 of 2) Never done   Pneumococcal Vaccine: 50+ Years (2 of 2 - PPSV23, PCV20, or PCV21) 05/09/2021   COVID-19 Vaccine (8 - Pfizer risk 2024-25 season) 07/10/2023    Colorectal cancer screening: No longer required.   Lung Cancer Screening: (Low Dose CT Chest recommended if Age 78-80 years, 20 pack-year currently smoking OR have quit w/in 15years.) does not qualify.   Lung Cancer Screening Referral:   Additional Screening:  Hepatitis C Screening never done  Vision Screening: Recommended annual ophthalmology exams for early detection of glaucoma and other disorders of the eye. Is the patient up to date with their annual eye exam?  No  Who is the provider or what is the name of the office in which the patient attends annual eye exams? Education provided If pt is not established with a provider, would they like to be referred to a provider to establish care? No .   Dental Screening: Recommended annual dental exams for proper oral hygiene  Community Resource Referral / Chronic Care Management: CRR required this visit?  No   CCM required this visit?  No     Plan:     I have personally reviewed and noted the following in the patient's chart:   Medical and social history Use of alcohol , tobacco or illicit drugs  Current medications and supplements including opioid prescriptions. Patient is not currently taking opioid prescriptions. Functional ability and status Nutritional status Physical activity Advanced directives List of other physicians Hospitalizations, surgeries, and ER visits in previous 12 months Vitals Screenings to include cognitive, depression, and falls Referrals and appointments  In addition, I have reviewed and discussed with patient certain preventive protocols, quality metrics,  and best practice recommendations. A written personalized care plan for preventive services as well as general preventive health recommendations were provided to patient.     Mliss Graff, LPN   [id number]   After Visit Summary: (MyChart) Due to this being a telephonic visit, the after visit summary with patients personalized plan was offered to patient via MyChart   Nurse Notes:

## 2023-11-21 ENCOUNTER — Other Ambulatory Visit (HOSPITAL_COMMUNITY): Payer: Self-pay

## 2023-11-21 NOTE — Progress Notes (Signed)
 Remote pacemaker transmission.

## 2023-11-21 NOTE — Addendum Note (Signed)
 Addended by: VICCI SELLER A on: 11/21/2023 10:22 AM   Modules accepted: Orders

## 2023-11-28 ENCOUNTER — Ambulatory Visit: Admitting: Physician Assistant

## 2023-11-28 ENCOUNTER — Ambulatory Visit (HOSPITAL_COMMUNITY)
Admission: RE | Admit: 2023-11-28 | Discharge: 2023-11-28 | Disposition: A | Discharge status: Home / Self Care | Source: Ambulatory Visit | Attending: Vascular Surgery | Admitting: Vascular Surgery

## 2023-11-28 VITALS — BP 133/62 | HR 89 | Temp 98.2°F | Wt 212.9 lb

## 2023-11-28 DIAGNOSIS — I739 Peripheral vascular disease, unspecified: Secondary | ICD-10-CM | POA: Insufficient documentation

## 2023-11-28 DIAGNOSIS — I6523 Occlusion and stenosis of bilateral carotid arteries: Secondary | ICD-10-CM

## 2023-11-28 LAB — VAS US ABI WITH/WO TBI

## 2023-11-28 NOTE — Progress Notes (Signed)
 Office Note   History of Present Illness   Glenn Collins is a 78 y.o. (06/25/1945) male who presents for surveillance of PAD.  He has no prior history of lower extremity revascularization.  He does have a history of intermittent bilateral calf claudication, right greater than left.  We also follow the patient for carotid stenosis with a history of left carotid endarterectomy on 03/27/2023 for high-grade asymptomatic stenosis.  He returns today for follow-up.  He is doing well at today's visits without any complaints.  He says he has not had any noticeable claudication in his calves for the past several months.  He also denies any rest pain or tissue loss.  He also denies any strokelike symptoms such as slurred speech, facial droop, sudden weakness/numbness, or sudden visual changes.  Current Outpatient Medications  Medication Sig Dispense Refill   aspirin  EC 81 MG tablet Take 81 mg by mouth daily. Swallow whole.     atorvastatin  (LIPITOR ) 80 MG tablet TAKE 1 TABLET BY MOUTH DAILY AT 6:00 IN THE EVENING 90 tablet 2   chlorthalidone  (HYGROTON ) 25 MG tablet Take 1 tablet (25 mg total) by mouth daily. 90 tablet 3   diphenhydrAMINE  (BENADRYL ) 25 MG tablet Take 25 mg by mouth at bedtime.     ezetimibe  (ZETIA ) 10 MG tablet Take 1 tablet (10 mg total) by mouth daily. 90 tablet 3   fluorouracil (EFUDEX) 5 % cream Apply 1 application  topically as needed.     metoprolol  succinate (TOPROL -XL) 50 MG 24 hr tablet Take 1 tablet (50 mg total) by mouth daily. Take with or immediately following a meal. 90 tablet 3   Multiple Vitamin (MULTIVITAMIN WITH MINERALS) TABS tablet Take 1 tablet by mouth daily.     nitroGLYCERIN  (NITROSTAT ) 0.4 MG SL tablet Place 1 tablet (0.4 mg total) under the tongue every 5 (five) minutes x 3 doses as needed for chest pain. 25 tablet 1   sodium chloride  (OCEAN) 0.65 % SOLN nasal spray Place 1 spray into both nostrils as needed for congestion.     No current  facility-administered medications for this visit.    REVIEW OF SYSTEMS (negative unless checked):   Cardiac:  []  Chest pain or chest pressure? []  Shortness of breath upon activity? []  Shortness of breath when lying flat? []  Irregular heart rhythm?  Vascular:  []  Pain in calf, thigh, or hip brought on by walking? []  Pain in feet at night that wakes you up from your sleep? []  Blood clot in your veins? []  Leg swelling?  Pulmonary:  []  Oxygen at home? []  Productive cough? []  Wheezing?  Neurologic:  []  Sudden weakness in arms or legs? []  Sudden numbness in arms or legs? []  Sudden onset of difficult speaking or slurred speech? []  Temporary loss of vision in one eye? []  Problems with dizziness?  Gastrointestinal:  []  Blood in stool? []  Vomited blood?  Genitourinary:  []  Burning when urinating? []  Blood in urine?  Psychiatric:  []  Major depression  Hematologic:  []  Bleeding problems? []  Problems with blood clotting?  Dermatologic:  []  Rashes or ulcers?  Constitutional:  []  Fever or chills?  Ear/Nose/Throat:  []  Change in hearing? []  Nose bleeds? []  Sore throat?  Musculoskeletal:  []  Back pain? []  Joint pain? []  Muscle pain?   Physical Examination   Vitals:   11/28/23 1042  BP: 133/62  Pulse: 89  Temp: 98.2 F (36.8 C)  TempSrc: Temporal  Weight: 212 lb 14.4 oz (96.6 kg)   Body  mass index is 29.69 kg/m.  General:  WDWN in NAD; vital signs documented above Gait: Not observed HENT: WNL, normocephalic Pulmonary: normal non-labored breathing , without rales, rhonchi,  wheezing Cardiac: Regular Abdomen: soft, NT, no masses Skin: without rashes Vascular Exam/Pulses: Nonpalpable pedal pulses bilaterally, brisk DP/PT Doppler signals bilaterally Extremities: without ischemic changes, without gangrene , without cellulitis; without open wounds;  Musculoskeletal: no muscle wasting or atrophy  Neurologic: A&O X 3;  No focal weakness or paresthesias are  detected Psychiatric:  The pt has Normal affect.  Non-Invasive Vascular imaging   ABI (11/28/2023) R:  ABI: [place] ([place]),  PT: mono DP: mono TBI:  0.49 L:  ABI: [place] ([place]),  PT: mono DP: mono TBI: 0.58   Medical Decision Making   Glenn Collins is a 78 y.o. male who presents for surveillance of PAD  Based on the patient's vascular studies, his ABIs remain noncompressible bilaterally.  He has monophasic tibial vessel flow bilaterally. He denies any rest pain or tissue loss.  He does have a history of intermittent bilateral calf claudication, however this has not bothered him for the past several months. We also follow the patient for carotid stenosis with recent left carotid endarterectomy last December.  He denies any strokelike symptoms. On exam he is neurologically intact.  He has brisk DP/PT Doppler signals bilaterally.  He has no wounds on exam. He has not had his post CEA duplex yet.  He can follow-up with our office in 3 months with carotid duplex.   Ahmed Holster PA-C [facility] Office: [phone]  Clinic MD: Sheree

## 2023-11-30 ENCOUNTER — Other Ambulatory Visit: Payer: Self-pay

## 2023-11-30 DIAGNOSIS — I6523 Occlusion and stenosis of bilateral carotid arteries: Secondary | ICD-10-CM

## 2023-12-13 ENCOUNTER — Encounter (HOSPITAL_COMMUNITY): Payer: Self-pay | Admitting: Cardiology

## 2023-12-13 ENCOUNTER — Ambulatory Visit (HOSPITAL_COMMUNITY)
Admission: RE | Admit: 2023-12-13 | Discharge: 2023-12-13 | Disposition: A | Discharge status: Home / Self Care | Source: Ambulatory Visit | Attending: Cardiology | Admitting: Cardiology

## 2023-12-13 VITALS — BP 130/80 | HR 92 | Wt 214.0 lb

## 2023-12-13 DIAGNOSIS — I5020 Unspecified systolic (congestive) heart failure: Secondary | ICD-10-CM | POA: Diagnosis present

## 2023-12-13 DIAGNOSIS — Z79899 Other long term (current) drug therapy: Secondary | ICD-10-CM | POA: Diagnosis not present

## 2023-12-13 DIAGNOSIS — I10 Essential (primary) hypertension: Secondary | ICD-10-CM | POA: Diagnosis not present

## 2023-12-13 DIAGNOSIS — I5022 Chronic systolic (congestive) heart failure: Secondary | ICD-10-CM | POA: Diagnosis not present

## 2023-12-13 DIAGNOSIS — Z955 Presence of coronary angioplasty implant and graft: Secondary | ICD-10-CM | POA: Diagnosis not present

## 2023-12-13 DIAGNOSIS — Z7982 Long term (current) use of aspirin: Secondary | ICD-10-CM | POA: Diagnosis not present

## 2023-12-13 DIAGNOSIS — I442 Atrioventricular block, complete: Secondary | ICD-10-CM | POA: Diagnosis not present

## 2023-12-13 DIAGNOSIS — I2510 Atherosclerotic heart disease of native coronary artery without angina pectoris: Secondary | ICD-10-CM | POA: Diagnosis not present

## 2023-12-13 DIAGNOSIS — R9431 Abnormal electrocardiogram [ECG] [EKG]: Secondary | ICD-10-CM | POA: Diagnosis not present

## 2023-12-13 DIAGNOSIS — I429 Cardiomyopathy, unspecified: Secondary | ICD-10-CM | POA: Diagnosis not present

## 2023-12-13 DIAGNOSIS — R600 Localized edema: Secondary | ICD-10-CM

## 2023-12-13 DIAGNOSIS — Z950 Presence of cardiac pacemaker: Secondary | ICD-10-CM | POA: Diagnosis not present

## 2023-12-13 MED ORDER — LOSARTAN POTASSIUM 25 MG PO TABS: 25.0000 mg | ORAL_TABLET | Freq: Every day | ORAL | 3 refills | Status: AC

## 2023-12-13 NOTE — Patient Instructions (Addendum)
 Stop chlorthalidone    START Losartan  25 mg daily.  Blood work in 2 weeks.  You have been referred to Dr. Waddell for Evaluation for a CRT-D. His office will call you to arrange your appointment.  Your physician recommends that you schedule a follow-up appointment in: 3 month ( November) ** PLEASE CALL THE OFFICE IN OCTOBER TO ARRANGE YOUR FOLLOW UP APPOINTMENT.**  If you have any questions or concerns before your next appointment please send us  a message through [place] or call our office at [phone].    TO LEAVE A MESSAGE FOR THE NURSE SELECT OPTION 2, PLEASE LEAVE A MESSAGE INCLUDING: YOUR NAME DATE OF BIRTH CALL BACK NUMBER REASON FOR CALL**this is important as we prioritize the call backs  YOU WILL RECEIVE A CALL BACK THE SAME DAY AS LONG AS YOU CALL BEFORE 4:00 PM  At the [facility], you and your health needs are our priority. As part of our continuing mission to provide you with exceptional heart care, we have created designated Provider Care Teams. These Care Teams include your primary Cardiologist (physician) and Advanced Practice Providers (APPs- Physician Assistants and Nurse Practitioners) who all work together to provide you with the care you need, when you need it.   You may see any of the following providers on your designated Care Team at your next follow up: Dr Toribio Fuel Dr Ezra Shuck Dr. Ria Commander Dr. [name] Amy Lenetta, NP Caffie Shed, [place] [facility], [place] Beckey Coe, NP [place] Lee, NP Ellouise Class, NP Tinnie Redman, PharmD Jaun Bash, PharmD   Please be sure to bring in all your medications bottles to every appointment.    Thank you for choosing [facility]

## 2023-12-18 NOTE — Progress Notes (Signed)
   ADVANCED HEART FAILURE NEW PATIENT CLINIC NOTE  Referring Physician: de [place], Quintin PARAS, MD  Primary Care: de [place], Quintin PARAS, MD Primary Cardiologist:  HPI: Glenn Collins is a 78 y.o. male with a PMH of HFmrEF, CHB, PAD, CAD, THN who presents for initial visit for further evaluation and treatment of heart failure/cardiomyopathy.      Patient previously followed with Dr. Court. He has a history of CAD s/p RCA and LAD stenting in 2007 with PCI to the RCA in 2017. He denies history of MI or CVA.  Had claudication symptoms and was started on cilostazol  with improvement, carotid Dopplers were negative.  Pacemaker was implanted for bradycardia related syncope, overall doing well since that time.     SUBJECTIVE:  Patient overall reports that he is doing well.  He denies any leg swelling, chest pain, PND and denies any recent change in his symptoms over the last 6 months.  He does get short of breath with more than moderate exertion, but otherwise doing fairly well.  We discussed his device interrogation and echocardiogram showing significantly elevated pacing burden.  PMH, current medications, allergies, social history, and family history reviewed in epic.  PHYSICAL EXAM: Vitals:   12/13/23 1153  BP: 130/80  Pulse: 92  SpO2: 97%   GENERAL: Well nourished and in no apparent distress at rest.  PULM:  Normal work of breathing, clear to auscultation bilaterally. Respirations are unlabored.  CARDIAC:  JVP: Flat         Normal rate with regular rhythm.  Systolic murmur, preserved S2.  No edema. Warm and well perfused extremities. ABDOMEN: Soft, non-tender, non-distended. NEUROLOGIC: Patient is oriented x3 with no focal or lateralizing neurologic deficits.    DATA REVIEW  ECG: 12/13/2023: A sensed V paced rhythm  ECHO: 10/02/2023: LVEF 40 to 45%, wall motion abnormalities in the septum consistent with paced rhythm, moderate aortic valve stenosis, severely dilated left  atrium  CATH: Known PCI to the RCA and LAD, none recent   ASSESSMENT & PLAN:  HFmrEF: Overall NYHA class II.  While he does have known ischemic disease, no anginal symptoms and after review of his echocardiogram he has clear evidence of pacer related dyssynchrony.  Given his drop in ejection fraction and 99% RV pacing would likely benefit from coronary sinus lead. - Referral to EP for consideration of coronary sinus lead given high pacing burden and drop in ejection fraction - Will transition chlorthalidone  to losartan  25 mg daily - Continue metoprolol  succinate 50 mg daily - Will not be overly aggressive with GDMT given that expect his ejection fraction to improve with resynchronization - Euvolemic today - Consider repeat coronary evaluation if he does not improve with resynchronization and conventional therapy  CAD:  - No anginal symptoms - Continue atorvastatin  and aspirin  81mg  daily  CHB: Prior issue, now with PPM placement. 99% pacing burden. - Consideration of device upgrade as above  PAD:  - Follows with Dr. Court  Follow up in 3 months  Glenn Brownie, MD Advanced [facility] Mechanical Circulatory Support 12/18/23

## 2023-12-27 ENCOUNTER — Ambulatory Visit: Payer: Medicare Other

## 2023-12-27 ENCOUNTER — Ambulatory Visit (HOSPITAL_COMMUNITY)
Admission: RE | Admit: 2023-12-27 | Discharge: 2023-12-27 | Disposition: A | Discharge status: Home / Self Care | Source: Ambulatory Visit | Attending: Cardiology | Admitting: Cardiology

## 2023-12-27 DIAGNOSIS — I442 Atrioventricular block, complete: Secondary | ICD-10-CM

## 2023-12-27 DIAGNOSIS — R600 Localized edema: Secondary | ICD-10-CM | POA: Diagnosis present

## 2023-12-27 LAB — BASIC METABOLIC PANEL WITH GFR
Anion gap: 9 (ref 5–15)
BUN: 14 mg/dL (ref 8–23)
CO2: 26 mmol/L (ref 22–32)
Calcium: 9.5 mg/dL (ref 8.9–10.3)
Chloride: 95 mmol/L — ABNORMAL LOW (ref 98–111)
Creatinine, Ser: 1.09 mg/dL (ref 0.61–1.24)
GFR, Estimated: >60 mL/min (ref >60)
Glucose, Bld: 112 mg/dL — ABNORMAL HIGH (ref 70–99)
Potassium: 4.5 mmol/L (ref 3.5–5.1)
Sodium: 130 mmol/L — ABNORMAL LOW (ref 135–145)

## 2023-12-27 LAB — CUP PACEART REMOTE DEVICE CHECK
Battery Voltage: 70
Date Time Interrogation Session: 20250911135621
Implantable Lead Connection Status: 753985
Implantable Lead Connection Status: 753985
Implantable Lead Implant Date: 20210614
Implantable Lead Implant Date: 20210614
Implantable Lead Location: 753859
Implantable Lead Location: 753860
Implantable Lead Model: 377171
Implantable Lead Model: 377171
Implantable Lead Serial Number: 7000244971
Implantable Lead Serial Number: 7000271886
Implantable Pulse Generator Implant Date: 20210614
Pulse Gen Model: 407145
Pulse Gen Serial Number: 69870190

## 2024-01-03 NOTE — Progress Notes (Signed)
 Remote PPM Transmission

## 2024-01-04 ENCOUNTER — Ambulatory Visit: Payer: Self-pay | Admitting: Internal Medicine

## 2024-01-08 ENCOUNTER — Other Ambulatory Visit (HOSPITAL_BASED_OUTPATIENT_CLINIC_OR_DEPARTMENT_OTHER): Payer: Self-pay

## 2024-01-08 MED ORDER — COMIRNATY 30 MCG/0.3ML IM SUSY
0.3000 mL | PREFILLED_SYRINGE | Freq: Once | INTRAMUSCULAR | 0 refills | Status: AC
  Filled 2024-01-08: qty 0.3, 1d supply, fill #0

## 2024-01-08 MED ORDER — FLUZONE HIGH-DOSE 0.5 ML IM SUSY
0.5000 mL | PREFILLED_SYRINGE | Freq: Once | INTRAMUSCULAR | 0 refills | Status: AC
  Filled 2024-01-08: qty 0.5, 1d supply, fill #0

## 2024-01-16 ENCOUNTER — Other Ambulatory Visit: Payer: Self-pay | Admitting: Cardiovascular Disease

## 2024-01-18 ENCOUNTER — Encounter: Payer: Self-pay | Admitting: Internal Medicine

## 2024-01-18 ENCOUNTER — Ambulatory Visit: Attending: Internal Medicine | Admitting: Internal Medicine

## 2024-01-18 VITALS — BP 116/74 | HR 94 | Ht 71.0 in | Wt 218.2 lb

## 2024-01-18 DIAGNOSIS — I442 Atrioventricular block, complete: Secondary | ICD-10-CM | POA: Diagnosis not present

## 2024-01-18 DIAGNOSIS — Z01812 Encounter for preprocedural laboratory examination: Secondary | ICD-10-CM

## 2024-01-18 LAB — CUP PACEART INCLINIC DEVICE CHECK
Date Time Interrogation Session: 20251003114233
Implantable Lead Connection Status: 753985
Implantable Lead Connection Status: 753985
Implantable Lead Implant Date: 20210614
Implantable Lead Implant Date: 20210614
Implantable Lead Location: 753859
Implantable Lead Location: 753860
Implantable Lead Model: 377171
Implantable Lead Model: 377171
Implantable Lead Serial Number: 7000244971
Implantable Lead Serial Number: 7000271886
Implantable Pulse Generator Implant Date: 20210614
Pulse Gen Model: 407145
Pulse Gen Serial Number: 69870190

## 2024-01-18 NOTE — Patient Instructions (Signed)
 Medication Instructions:  Your physician recommends that you continue on your current medications as directed. Please refer to the Current Medication list given to you today.  *If you need a refill on your cardiac medications before your next appointment, please call your pharmacy*  Lab Work: With in 30 days of procedure.  You may go to any [facility] Location for your lab work:  [facility] - [address] ([facility]) - [address] [phone] [address]  [place] - [address]   [place]  - [address]   [place] - [address] - [address] [employer]  - [address] ([facility]   If you have labs (blood work) drawn today and your tests are completely normal, you will receive your results only by: [employer] (if you have MyChart)  If you have any lab test that is abnormal or we need to change your treatment, we will call you or send a MyChart message to review the results.  Testing/Procedures: BiV upgrade Pacemaker on Dec 3. Instructions to come.  Follow-Up: At [facility], you and your health needs are our priority.  As part of our continuing mission to provide you with exceptional heart care, we have created designated Provider Care Teams.  These Care Teams include your primary Cardiologist (physician) and Advanced Practice Providers (APPs -  Physician Assistants and Nurse Practitioners) who all work together to provide you with the care you need, when you need it.  Your next appointment:   To be scheduled

## 2024-01-18 NOTE — Progress Notes (Signed)
 HPI [name] returns today for followup. He is a pleasant 78 yo man with high grade heart block, now CHB, syncope, and PAD, s/p PPM insertion. He also has AS, almost surgical in severity. He has done well in the interim. If he gets in a hurry he will get lightheaded. He has fatigue and weakness. No chest pain or sob. No edema. He has undergone hip replacement. His EF has decreased by echo and he has a wide paced left bundle qrs of over 170 ms.   No Known Allergies   Current Outpatient Medications  Medication Sig Dispense Refill   aspirin  EC 81 MG tablet Take 81 mg by mouth daily. Swallow whole.     atorvastatin  (LIPITOR ) 80 MG tablet TAKE 1 TABLET BY MOUTH DAILY AT 6:00 IN THE EVENING 90 tablet 2   diphenhydrAMINE  (BENADRYL ) 25 MG tablet Take 25 mg by mouth at bedtime.     ezetimibe  (ZETIA ) 10 MG tablet Take 1 tablet (10 mg total) by mouth daily. 90 tablet 3   fluorouracil (EFUDEX) 5 % cream Apply 1 application  topically as needed.     losartan  (COZAAR ) 25 MG tablet Take 1 tablet (25 mg total) by mouth daily. 90 tablet 3   metoprolol  succinate (TOPROL -XL) 50 MG 24 hr tablet Take 1 tablet (50 mg total) by mouth daily. Take with or immediately following a meal. 90 tablet 3   Multiple Vitamin (MULTIVITAMIN WITH MINERALS) TABS tablet Take 1 tablet by mouth daily.     nitroGLYCERIN  (NITROSTAT ) 0.4 MG SL tablet Place 1 tablet (0.4 mg total) under the tongue every 5 (five) minutes x 3 doses as needed for chest pain. 25 tablet 1   sodium chloride  (OCEAN) 0.65 % SOLN nasal spray Place 1 spray into both nostrils as needed for congestion.     triamcinolone cream (KENALOG) 0.1 % Apply 1 Application topically as needed.     No current facility-administered medications for this visit.     Past Medical History:  Diagnosis Date   Arthritis    CAD (coronary artery disease) 10/27/2018   Carotid artery occlusion    Coronary artery disease    Heart murmur Since childhood   History of kidney  stones 2021   Hypertension    PAD (peripheral artery disease) 10/27/2018   Presence of permanent cardiac pacemaker    Biotronik PPM    ROS:   All systems reviewed and negative except as noted in the HPI.   Past Surgical History:  Procedure Laterality Date   CARDIAC SURGERY  2019   with stents x2   ENDARTERECTOMY Left 03/27/2023   Procedure: ENDARTERECTOMY CAROTID;  Surgeon: Sheree Penne Bruckner, MD;  Location: [facility] OR;  Service: Vascular;  Laterality: Left;   HERNIA REPAIR  2012   PACEMAKER IMPLANT N/A 09/29/2019   Procedure: PACEMAKER IMPLANT;  Surgeon: Waddell Danelle ORN, MD;  Location: [facility] INVASIVE CV LAB;  Service: Cardiovascular;  Laterality: N/A;   PATCH ANGIOPLASTY Left 03/27/2023   Procedure: GEORGE PATCH ANGIOPLASTY;  Surgeon: Sheree Penne Bruckner, MD;  Location: [facility] OR;  Service: Vascular;  Laterality: Left;   TOTAL HIP ARTHROPLASTY Right 06/15/2021   Procedure: TOTAL HIP ARTHROPLASTY ANTERIOR APPROACH;  Surgeon: Fidel Rogue, MD;  Location: [facility] ORS;  Service: Orthopedics;  Laterality: Right;     Family History  Problem Relation Age of Onset   Cancer Mother    Hearing loss Mother    CVA Father      Social History  Socioeconomic History   Marital status: Married    Spouse name: Not on file   Number of children: Not on file   Years of education: Not on file   Highest education level: Doctorate  Occupational History   Not on file  Tobacco Use   Smoking status: Former    Current packs/day: 0.00    Average packs/day: 1.5 packs/day for 40.0 years (60.0 ttl pk-yrs)    Types: Cigarettes    Start date: 10/15/1960    Quit date: 10/15/2000    Years since quitting: 23.2    Passive exposure: Past   Smokeless tobacco: Never  Vaping Use   Vaping status: Never Used  Substance and Sexual Activity   Alcohol  use: Yes    Alcohol /week: 28.0 standard drinks of alcohol     Types: 28 Glasses of wine per week    Comment: 4 glasses per day   Drug use: Never   Sexual  activity: Not Currently  Other Topics Concern   Not on file  Social History Narrative   Not on file   Social Drivers of Health   Financial Resource Strain: Low Risk  (11/13/2023)   Overall Financial Resource Strain (CARDIA)    Difficulty of Paying Living Expenses: Not hard at all  Food Insecurity: No Food Insecurity (11/13/2023)   Hunger Vital Sign    Worried About Running Out of Food in the Last Year: Never true    Ran Out of Food in the Last Year: Never true  Transportation Needs: No Transportation Needs (11/13/2023)   PRAPARE - Administrator, Civil Service (Medical): No    Lack of Transportation (Non-Medical): No  Physical Activity: Insufficiently Active (11/13/2023)   Exercise Vital Sign    Days of Exercise per Week: 3 days    Minutes of Exercise per Session: 30 min  Stress: No Stress Concern Present (11/13/2023)   [employer] of Occupational Health - Occupational Stress Questionnaire    Feeling of Stress: Not at all  Social Connections: Moderately Integrated (11/13/2023)   Social Connection and Isolation Panel    Frequency of Communication with Friends and Family: More than three times a week    Frequency of Social Gatherings with Friends and Family: Three times a week    Attends Religious Services: Never    Active Member of Clubs or Organizations: Yes    Attends Banker Meetings: More than 4 times per year    Marital Status: Married  Catering manager Violence: Not At Risk (11/13/2023)   Humiliation, Afraid, Rape, and Kick questionnaire    Fear of Current or Ex-Partner: No    Emotionally Abused: No    Physically Abused: No    Sexually Abused: No     BP 116/74   Pulse 94   Ht 5' 11 (1.803 m)   Wt 218 lb 3.2 oz (99 kg)   SpO2 95%   BMI 30.43 kg/m   Physical Exam:  Well appearing NAD HEENT: Unremarkable Neck:  No JVD, no thyromegally Lymphatics:  No adenopathy Back:  No CVA tenderness Lungs:  Clear with no wheezes HEART:  Regular  rate rhythm, 3/6 AS murmur, no rubs, no clicks Abd:  soft, positive bowel sounds, no organomegally, no rebound, no guarding Ext:  2 plus pulses, no edema, no cyanosis, no clubbing Skin:  No rashes no nodules Neuro:  CN II through XII intact, motor grossly intact  DEVICE  Normal device function.  See PaceArt for details.   Assess/Plan: Pacing  induced LV dysfunction - I have discussed the treatment options. He has class 3 symptoms, a pacing induced LBBB and QRS duration of 175 and CHB with no escape. I have reviewed the indications/risks/benefits/goals/expectations of BIV PPM upgrade and he wishes to proceed. AS - he will need TAVR in the next year or two I would suspect.  Danelle Deep [place],MD

## 2024-02-06 ENCOUNTER — Ambulatory Visit: Admitting: Internal Medicine

## 2024-02-25 ENCOUNTER — Encounter (HOSPITAL_BASED_OUTPATIENT_CLINIC_OR_DEPARTMENT_OTHER): Payer: Self-pay

## 2024-02-27 ENCOUNTER — Telehealth (HOSPITAL_COMMUNITY): Payer: Self-pay

## 2024-02-27 NOTE — Telephone Encounter (Signed)
 Attempted to reach patient to discuss upcoming procedure, no answer. Left VM for patient to return call.

## 2024-02-27 NOTE — Telephone Encounter (Addendum)
 Spoke with patient to complete pre-procedure call.     Health status review:  Any new medical conditions, recent signs of acute illness or been started on antibiotics? No Any recent hospitalizations or surgeries? No Any new medications started since pre-op visit? No  Follow all medication instructions prior to procedure or the procedure may be rescheduled:    HOLD Aspirin  for 2 days prior to your procedure. Last dose on November 30. On the morning of your procedure take all other morning medications with a sip water .  Nothing to eat or drink after midnight prior to your procedure. The night before your procedure and the morning of your procedure, wash thoroughly with the CHG surgical soap from the neck down, paying special attention to the area where your procedure will be performed.  Pre-procedure testing scheduled: lab work by November 19.  Confirmed patient is scheduled for BIV Permanent Pacemaker Upgrade on Wednesday, December 3 with Dr. Waddell. Instructed patient to arrive at the Main Entrance A at [facility]: [address] [place], [place] [address] and check in at Admitting at 6:30 AM.  Plan to go home the same day, you will only stay overnight if medically necessary. You MUST have a responsible adult to drive you home and MUST be with you the first 24 hours after you arrive home or your procedure could be cancelled.  Informed a nurse may call a day before the procedure to confirm arrival time and ensure instructions are followed.  Patient verbalized understanding to information provided and is agreeable to proceed with procedure.   Advised to contact RN Navigator at [phone], to inform of any new medications started after call or concerns prior to procedure.

## 2024-03-03 ENCOUNTER — Ambulatory Visit (HOSPITAL_BASED_OUTPATIENT_CLINIC_OR_DEPARTMENT_OTHER): Admitting: Family Medicine

## 2024-03-03 LAB — CBC
Hematocrit: 36.8 % — ABNORMAL LOW (ref 37.5–51.0)
Hemoglobin: 12.1 g/dL — ABNORMAL LOW (ref 13.0–17.7)
MCH: 33.2 pg — ABNORMAL HIGH (ref 26.6–33.0)
MCHC: 32.9 g/dL (ref 31.5–35.7)
MCV: 101 fL — ABNORMAL HIGH (ref 79–97)
Platelets: 219 x10E3/uL (ref 150–450)
RBC: 3.65 x10E6/uL — ABNORMAL LOW (ref 4.14–5.80)
RDW: 12.7 % (ref 11.6–15.4)
WBC: 7.3 x10E3/uL (ref 3.4–10.8)

## 2024-03-04 LAB — BASIC METABOLIC PANEL WITH GFR
BUN/Creatinine Ratio: 13 (ref 10–24)
BUN: 14 mg/dL (ref 8–27)
CO2: 18 mmol/L — ABNORMAL LOW (ref 20–29)
Calcium: 9.5 mg/dL (ref 8.6–10.2)
Chloride: 95 mmol/L — ABNORMAL LOW (ref 96–106)
Creatinine, Ser: 1.11 mg/dL (ref 0.76–1.27)
Glucose: 181 mg/dL — ABNORMAL HIGH (ref 70–99)
Potassium: 5.2 mmol/L (ref 3.5–5.2)
Sodium: 132 mmol/L — ABNORMAL LOW (ref 134–144)
eGFR: 68 mL/min/1.73 (ref >59)

## 2024-03-11 NOTE — Progress Notes (Unsigned)
 HISTORY AND PHYSICAL     CC:  follow up. Requesting Provider:  de [place], Raymond J, MD  HPI: This is a 78 y.o. male here for follow up for carotid artery stenosis.  Pt is s/p  left CEA on 03/27/2023 by Dr. Sheree for asymptomatic carotid artery stenosis.  The right ICA stenosis was 1-39%.    Pt was last seen 11/28/2023 for PAD and at that time he was not having any issues with claudication, rest pain or non healing wounds.  He was not having any neurological sx.  He was scheduled for f/u carotid duplex.   Pt returns today for follow up.    Pt denies any amaurosis fugax, speech difficulties, weakness, numbness, paralysis or clumsiness or facial droop.    He denies any claudication, rest pain or tissue loss.  He has pain around the right ankle where he has had a previous injury.    He is getting a pacemaker replacement next Wednesday.    The pt is on a statin for cholesterol management.  The pt is on a daily aspirin .   Other AC:  none The pt is on ARB, BB for hypertension.   The pt is not on medication for diabetes Tobacco hx:  former  Pt does not have family hx of AAA.  Past Medical History:  Diagnosis Date   Arthritis    CAD (coronary artery disease) 10/27/2018   Carotid artery occlusion    Coronary artery disease    Heart murmur Since childhood   History of kidney stones 2021   Hypertension    PAD (peripheral artery disease) 10/27/2018   Presence of permanent cardiac pacemaker    Biotronik PPM    Past Surgical History:  Procedure Laterality Date   CARDIAC SURGERY  2019   with stents x2   ENDARTERECTOMY Left 03/27/2023   Procedure: ENDARTERECTOMY CAROTID;  Surgeon: Sheree Penne Bruckner, MD;  Location: [facility] OR;  Service: Vascular;  Laterality: Left;   HERNIA REPAIR  2012   PACEMAKER IMPLANT N/A 09/29/2019   Procedure: PACEMAKER IMPLANT;  Surgeon: Waddell Danelle ORN, MD;  Location: [facility] INVASIVE CV LAB;  Service: Cardiovascular;  Laterality: N/A;   PATCH ANGIOPLASTY Left  03/27/2023   Procedure: GEORGE PATCH ANGIOPLASTY;  Surgeon: Sheree Penne Bruckner, MD;  Location: [facility] OR;  Service: Vascular;  Laterality: Left;   TOTAL HIP ARTHROPLASTY Right 06/15/2021   Procedure: TOTAL HIP ARTHROPLASTY ANTERIOR APPROACH;  Surgeon: Fidel Rogue, MD;  Location: [facility] ORS;  Service: Orthopedics;  Laterality: Right;    No Known Allergies  Current Outpatient Medications  Medication Sig Dispense Refill   aspirin  EC 81 MG tablet Take 81 mg by mouth daily. Swallow whole.     atorvastatin  (LIPITOR ) 80 MG tablet TAKE 1 TABLET BY MOUTH DAILY AT 6:00 IN THE EVENING 90 tablet 2   diphenhydrAMINE  (BENADRYL ) 25 MG tablet Take 25 mg by mouth at bedtime.     ezetimibe  (ZETIA ) 10 MG tablet Take 1 tablet (10 mg total) by mouth daily. 90 tablet 3   losartan  (COZAAR ) 25 MG tablet Take 1 tablet (25 mg total) by mouth daily. 90 tablet 3   metoprolol  succinate (TOPROL -XL) 50 MG 24 hr tablet Take 1 tablet (50 mg total) by mouth daily. Take with or immediately following a meal. 90 tablet 3   Multiple Vitamin (MULTIVITAMIN WITH MINERALS) TABS tablet Take 1 tablet by mouth daily.     nitroGLYCERIN  (NITROSTAT ) 0.4 MG SL tablet Place 1 tablet (0.4 mg total) under the  tongue every 5 (five) minutes x 3 doses as needed for chest pain. 25 tablet 1   sodium chloride  (OCEAN) 0.65 % SOLN nasal spray Place 1 spray into both nostrils as needed for congestion.     No current facility-administered medications for this visit.    Family History  Problem Relation Age of Onset   Cancer Mother    Hearing loss Mother    CVA Father     Social History   Socioeconomic History   Marital status: Married    Spouse name: Not on file   Number of children: Not on file   Years of education: Not on file   Highest education level: Doctorate  Occupational History   Not on file  Tobacco Use   Smoking status: Former    Current packs/day: 0.00    Average packs/day: 1.5 packs/day for 40.0 years (60.0 ttl pk-yrs)     Types: Cigarettes    Start date: 10/15/1960    Quit date: 10/15/2000    Years since quitting: 23.4    Passive exposure: Past   Smokeless tobacco: Never  Vaping Use   Vaping status: Never Used  Substance and Sexual Activity   Alcohol  use: Yes    Alcohol /week: 28.0 standard drinks of alcohol     Types: 28 Glasses of wine per week    Comment: 4 glasses per day   Drug use: Never   Sexual activity: Not Currently  Other Topics Concern   Not on file  Social History Narrative   Not on file   Social Drivers of Health   Financial Resource Strain: Low Risk  (11/13/2023)   Overall Financial Resource Strain (CARDIA)    Difficulty of Paying Living Expenses: Not hard at all  Food Insecurity: No Food Insecurity (11/13/2023)   Hunger Vital Sign    Worried About Running Out of Food in the Last Year: Never true    Ran Out of Food in the Last Year: Never true  Transportation Needs: No Transportation Needs (11/13/2023)   PRAPARE - Administrator, Civil Service (Medical): No    Lack of Transportation (Non-Medical): No  Physical Activity: Insufficiently Active (11/13/2023)   Exercise Vital Sign    Days of Exercise per Week: 3 days    Minutes of Exercise per Session: 30 min  Stress: No Stress Concern Present (11/13/2023)   [employer] of Occupational Health - Occupational Stress Questionnaire    Feeling of Stress: Not at all  Social Connections: Moderately Integrated (11/13/2023)   Social Connection and Isolation Panel    Frequency of Communication with Friends and Family: More than three times a week    Frequency of Social Gatherings with Friends and Family: Three times a week    Attends Religious Services: Never    Active Member of Clubs or Organizations: Yes    Attends Banker Meetings: More than 4 times per year    Marital Status: Married  Catering Manager Violence: Not At Risk (11/13/2023)   Humiliation, Afraid, Rape, and Kick questionnaire    Fear of Current or  Ex-Partner: No    Emotionally Abused: No    Physically Abused: No    Sexually Abused: No     REVIEW OF SYSTEMS:   [X]  denotes positive finding, [ ]  denotes negative finding Cardiac  Comments:  Chest pain or chest pressure:    Shortness of breath upon exertion:    Short of breath when lying flat:    Irregular heart rhythm:  Vascular    Pain in calf, thigh, or hip brought on by ambulation:    Pain in feet at night that wakes you up from your sleep:     Blood clot in your veins:    Leg swelling:         Pulmonary    Oxygen at home:    Productive cough:     Wheezing:         Neurologic    Sudden weakness in arms or legs:     Sudden numbness in arms or legs:     Sudden onset of difficulty speaking or slurred speech:    Temporary loss of vision in one eye:     Problems with dizziness:         Gastrointestinal    Blood in stool:     Vomited blood:         Genitourinary    Burning when urinating:     Blood in urine:        Psychiatric    Major depression:         Hematologic    Bleeding problems:    Problems with blood clotting too easily:        Skin    Rashes or ulcers:        Constitutional    Fever or chills:      PHYSICAL EXAMINATION:  Today's Vitals   03/12/24 1217 03/12/24 1218 03/12/24 1220  BP: (!) 144/79 128/81 133/73  Pulse: 91 82 82  Temp: 98.1 F (36.7 C)    TempSrc: Temporal    Weight: 216 lb 11.2 oz (98.3 kg)    PainSc: 0-No pain     Body mass index is 30.22 kg/m.   General:  WDWN in NAD; vital signs documented above Gait: Not observed HENT: WNL, normocephalic Pulmonary: normal non-labored breathing Cardiac: regular HR, with carotid bruits bilaterally Abdomen: soft, NT; aortic pulse is not palpable Skin: without rashes Vascular Exam/Pulses:  Right Left  Radial 2+ (normal) 2+ (normal)  AT Faint monophasic Faint monophasic  PT Brisk monophasic Brisk monophasic  Peroneal Brisk monophasic Brisk monophasic   Extremities:  without open wounds Musculoskeletal: no muscle wasting or atrophy  Neurologic: A&O X 3; moving all extremities equally; speech is fluent/normal Psychiatric:  The pt has Normal affect.   Non-Invasive Vascular Imaging:   Carotid Duplex on 03/12/2024 Right:  1-39% ICA stenosis Left:  1-39% ICA stenosis   ABI 11/28/2023 +-------+-----------+-----------+------------+------------+  ABI/TBIToday's ABIToday's TBIPrevious ABIPrevious TBI  +-------+-----------+-----------+------------+------------+  Right [place]         0.49       [place]          0.54          +-------+-----------+-----------+------------+------------+  Left  [place]         0.58       [place]          0.68          +-------+-----------+-----------+------------+------------+    ASSESSMENT/PLAN:: 78 y.o. male here for follow up carotid artery stenosis and has hx of  left CEA on 03/27/2023 by Dr. Sheree for asymptomatic carotid artery stenosis.  The right ICA stenosis was 1-39%  Carotid stenosis -duplex today reveals bilateral 1-39% ICA stenosis -discussed s/s of stroke with pt and he understands should he develop any of these sx, he will go to the nearest ER or call 911.  PAD -denies claudication, rest pain or tissue loss.  ABI in August non compressible  and he has + doppler flow bilateral PT and pero.    -pt will f/u in one year with carotid duplex and ABI.  He will see Dr. Sheree since he did not see him the past two visits and request to see him.  -pt will call sooner should he have any issues. -continue statin/asa    Lucie Apt, [facility] [phone]  Clinic MD:  Sheree

## 2024-03-12 ENCOUNTER — Ambulatory Visit (HOSPITAL_COMMUNITY)
Admission: RE | Admit: 2024-03-12 | Discharge: 2024-03-12 | Disposition: A | Discharge status: Home / Self Care | Source: Ambulatory Visit | Attending: Vascular Surgery | Admitting: Vascular Surgery

## 2024-03-12 ENCOUNTER — Encounter: Payer: Self-pay | Admitting: Physician Assistant

## 2024-03-12 ENCOUNTER — Ambulatory Visit: Admitting: Physician Assistant

## 2024-03-12 VITALS — BP 133/73 | HR 82 | Temp 98.1°F | Wt 216.7 lb

## 2024-03-12 DIAGNOSIS — I6523 Occlusion and stenosis of bilateral carotid arteries: Secondary | ICD-10-CM | POA: Diagnosis present

## 2024-03-18 NOTE — Pre-Procedure Instructions (Signed)
 Attempted to call patient regarding procedure instructions for tomorrow.  Left voicemail on the following items: Arrival time 0615 Nothing to eat or drink after midnight No meds AM of procedure Responsible person to drive you home and stay with you for 24 hrs Wash with special soap night before and morning of procedure If on anti-coagulant drug instructions ASA-last dose 11/30

## 2024-03-19 ENCOUNTER — Ambulatory Visit (HOSPITAL_COMMUNITY)
Admission: RE | Admit: 2024-03-19 | Discharge: 2024-03-19 | Disposition: A | Discharge status: Home / Self Care | Attending: Internal Medicine | Admitting: Internal Medicine

## 2024-03-19 ENCOUNTER — Other Ambulatory Visit: Payer: Self-pay

## 2024-03-19 ENCOUNTER — Ambulatory Visit (HOSPITAL_COMMUNITY)
Admission: RE | Disposition: A | Discharge status: Home / Self Care | Payer: Self-pay | Source: Home / Self Care | Attending: Internal Medicine

## 2024-03-19 HISTORY — PX: BIV UPGRADE: EP1202

## 2024-03-19 HISTORY — PX: LEAD INSERTION: EP1212

## 2024-03-19 MED ORDER — CEFAZOLIN SODIUM-DEXTROSE 2-4 GM/100ML-% IV SOLN
2.0000 g | INTRAVENOUS | Status: AC
  Administered 2024-03-19: 2 g via INTRAVENOUS

## 2024-03-19 MED ORDER — SODIUM CHLORIDE 0.9 % IV SOLN: INTRAVENOUS | Status: DC

## 2024-03-19 MED ORDER — MIDAZOLAM HCL 5 MG/5ML IJ SOLN
INTRAMUSCULAR | Status: DC | PRN
  Administered 2024-03-19: 1 mg via INTRAVENOUS

## 2024-03-19 MED ORDER — SODIUM CHLORIDE 0.9 % IV SOLN
INTRAVENOUS | Status: DC
  Filled 2024-03-19: qty 2

## 2024-03-19 MED ORDER — ONDANSETRON HCL 4 MG/2ML IJ SOLN: 4.0000 mg | Freq: Four times a day (QID) | INTRAMUSCULAR | Status: DC | PRN

## 2024-03-19 MED ORDER — POVIDONE-IODINE 10 % EX SWAB
2.0000 | Freq: Once | CUTANEOUS | Status: AC
  Administered 2024-03-19: 2 via TOPICAL

## 2024-03-19 MED ORDER — SODIUM CHLORIDE 0.9 % IV SOLN: 80.0000 mg | INTRAVENOUS | Status: DC

## 2024-03-19 MED ORDER — ACETAMINOPHEN 325 MG PO TABS: 325.0000 mg | ORAL_TABLET | ORAL | Status: DC | PRN

## 2024-03-19 MED ORDER — CEFAZOLIN SODIUM-DEXTROSE 2-4 GM/100ML-% IV SOLN
INTRAVENOUS | Status: DC
  Filled 2024-03-19: qty 100

## 2024-03-19 MED ORDER — CHLORHEXIDINE GLUCONATE 4 % EX SOLN
4.0000 | Freq: Once | CUTANEOUS | Status: AC
  Administered 2024-03-19: 4 via TOPICAL

## 2024-03-19 MED ORDER — MIDAZOLAM HCL 2 MG/2ML IJ SOLN
INTRAMUSCULAR | Status: AC
  Filled 2024-03-19: qty 2

## 2024-03-19 MED ORDER — SODIUM CHLORIDE 0.9% FLUSH: 3.0000 mL | INTRAVENOUS | Status: DC | PRN

## 2024-03-19 MED ORDER — LIDOCAINE HCL (PF) 1 % IJ SOLN
INTRAMUSCULAR | Status: AC
  Filled 2024-03-19: qty 60

## 2024-03-19 MED ORDER — IOHEXOL 350 MG/ML SOLN
INTRAVENOUS | Status: DC | PRN
  Administered 2024-03-19: 25 mL via INTRAVENOUS

## 2024-03-19 MED ORDER — FENTANYL CITRATE (PF) 100 MCG/2ML IJ SOLN
INTRAMUSCULAR | Status: DC | PRN
  Administered 2024-03-19: 12.5 ug via INTRAVENOUS

## 2024-03-19 MED ORDER — FENTANYL CITRATE (PF) 100 MCG/2ML IJ SOLN
INTRAMUSCULAR | Status: AC
  Filled 2024-03-19: qty 2

## 2024-03-19 NOTE — Discharge Instructions (Signed)
 Implantable Cardiac Device, Care After  This sheet gives you information about how to care for yourself after your procedure. Your health care provider may also give you more specific instructions. If you have problems or questions, contact your health care provider. What can I expect after the procedure? After your procedure, it is common to have: Pain or soreness at the site where we attempted to insert cardiac device. Swelling at the site. No changes were made to your cardiac device Follow these instructions at home: Incision care  Keep the incision clean and dry. Do not take baths, swim, or use a hot tub until after your wound check.  Pat the area dry with a clean towel. Do not rub the area. This may cause bleeding. Follow instructions from your health care provider about how to take care of your incision. Make sure you:  Check your incision area every day for signs of infection. Check for: More redness, swelling, or pain. More fluid or blood. Warmth. Pus or a bad smell. Activity Do not lift anything that is heavier than 10 lb (4.5 kg) until your health care provider says it is okay to do so. For the first week, or as long as told by your health care provider: After 1 week, Be gentle when you move your arms over your head. It is okay to raise your arm to comb your hair. Avoid strenuous exercise. Ask your health care provider when it is okay to: Resume your normal activities. Return to work or school. Resume sexual activity. Eating and drinking Eat a heart-healthy diet. This should include plenty of fresh fruits and vegetables, whole grains, low-fat dairy products, and lean protein like chicken and fish. Limit alcohol  intake to no more than 1 drink a day for non-pregnant women and 2 drinks a day for men. One drink equals 12 oz of beer, 5 oz of wine, or 1 oz of hard liquor. Check ingredients and nutrition facts on packaged foods and beverages. Avoid the following types of  food: Food that is high in salt (sodium). Food that is high in saturated fat, like full-fat dairy or red meat. Food that is high in trans fat, like fried food. Food and drinks that are high in sugar. Lifestyle Do not use any products that contain nicotine or tobacco, such as cigarettes and e-cigarettes. If you need help quitting, ask your health care provider. Take steps to manage and control your weight. Once cleared, get regular exercise. Aim for 150 minutes of moderate-intensity exercise (such as walking or yoga) or 75 minutes of vigorous exercise (such as running or swimming) each week. Manage other health problems, such as diabetes or high blood pressure. Ask your health care provider how you can manage these conditions. General instructions Do not drive for 24 hours after your procedure if you were given a medicine to help you relax (sedative). Take over-the-counter and prescription medicines only as told by your health care provider. Avoid putting pressure on the area where the cardiac device was placed. If you need an MRI after your cardiac device has been placed, be sure to tell the health care provider who orders the MRI that you have a cardiac device. Avoid close and prolonged exposure to electrical devices that have strong magnetic fields. These include: Cell phones. Avoid keeping them in a pocket near the cardiac device, and try using the ear opposite the cardiac device. MP3 players. Household appliances, like microwaves. Metal detectors. Electric generators. High-tension wires. Keep all follow-up visits as  directed by your health care provider. This is important. Contact a health care provider if: You have pain at the incision site that is not relieved by over-the-counter or prescription medicines. You have any of these around your incision site or coming from it: More redness, swelling, or pain. Fluid or blood. Warmth to the touch. Pus or a bad smell. You have a  fever. You feel brief, occasional palpitations, light-headedness, or any symptoms that you think might be related to your heart. Get help right away if: You experience chest pain that is different from the pain at the cardiac device site. You develop a red streak that extends above or below the incision site. You experience shortness of breath. You have palpitations or an irregular heartbeat. You have light-headedness that does not go away quickly. You faint or have dizzy spells. Your pulse suddenly drops or increases rapidly and does not return to normal. You begin to gain weight and your legs and ankles swell. Summary After your procedure, it is common to have pain, soreness, and some swelling where the cardiac device was inserted. Make sure to keep your incision clean and dry. Follow instructions from your health care provider about how to take care of your incision. Check your incision every day for signs of infection, such as more pain or swelling, pus or a bad smell, warmth, or leaking fluid and blood. Avoid strenuous exercise and lifting your left arm higher than your shoulder for 2 weeks, or as long as told by your health care provider. This information is not intended to replace advice given to you by your health care provider. Make sure you discuss any questions you have with your health care provider.

## 2024-03-19 NOTE — H&P (Signed)
 HPI Glenn Collins returns today for followup. He is a pleasant 78 yo man with high grade heart block, now CHB, syncope, and PAD, s/p PPM insertion. He also has AS, almost surgical in severity. He has done well in the interim. If he gets in a hurry he will get lightheaded. He has fatigue and weakness. No chest pain or sob. No edema. He has undergone hip replacement. His EF has decreased by echo and he has a wide paced left bundle qrs of over 170 ms.    Allergies  No Known Allergies             Current Outpatient Medications  Medication Sig Dispense Refill   aspirin  EC 81 MG tablet Take 81 mg by mouth daily. Swallow whole.       atorvastatin  (LIPITOR ) 80 MG tablet TAKE 1 TABLET BY MOUTH DAILY AT 6:00 IN THE EVENING 90 tablet 2   diphenhydrAMINE  (BENADRYL ) 25 MG tablet Take 25 mg by mouth at bedtime.       ezetimibe  (ZETIA ) 10 MG tablet Take 1 tablet (10 mg total) by mouth daily. 90 tablet 3   fluorouracil (EFUDEX) 5 % cream Apply 1 application  topically as needed.       losartan  (COZAAR ) 25 MG tablet Take 1 tablet (25 mg total) by mouth daily. 90 tablet 3   metoprolol  succinate (TOPROL -XL) 50 MG 24 hr tablet Take 1 tablet (50 mg total) by mouth daily. Take with or immediately following a meal. 90 tablet 3   Multiple Vitamin (MULTIVITAMIN WITH MINERALS) TABS tablet Take 1 tablet by mouth daily.       nitroGLYCERIN  (NITROSTAT ) 0.4 MG SL tablet Place 1 tablet (0.4 mg total) under the tongue every 5 (five) minutes x 3 doses as needed for chest pain. 25 tablet 1   sodium chloride  (OCEAN) 0.65 % SOLN nasal spray Place 1 spray into both nostrils as needed for congestion.       triamcinolone cream (KENALOG) 0.1 % Apply 1 Application topically as needed.          No current facility-administered medications for this visit.              Past Medical History:  Diagnosis Date   Arthritis     CAD (coronary artery disease) 10/27/2018   Carotid artery occlusion     Coronary artery  disease     Heart murmur Since childhood   History of kidney stones 2021   Hypertension     PAD (peripheral artery disease) 10/27/2018   Presence of permanent cardiac pacemaker      Biotronik PPM          ROS:    All systems reviewed and negative except as noted in the HPI.          Past Surgical History:  Procedure Laterality Date   CARDIAC SURGERY   2019    with stents x2   ENDARTERECTOMY Left 03/27/2023    Procedure: ENDARTERECTOMY CAROTID;  Surgeon: Sheree Penne Bruckner, MD;  Location: [facility] OR;  Service: Vascular;  Laterality: Left;   HERNIA REPAIR   2012   PACEMAKER IMPLANT N/A 09/29/2019    Procedure: PACEMAKER IMPLANT;  Surgeon: Waddell Danelle ORN, MD;  Location: [facility] INVASIVE CV LAB;  Service: Cardiovascular;  Laterality: N/A;   PATCH ANGIOPLASTY Left 03/27/2023    Procedure: GEORGE PATCH ANGIOPLASTY;  Surgeon: Sheree Penne Bruckner, MD;  Location: [facility] OR;  Service: Vascular;  Laterality: Left;   TOTAL HIP ARTHROPLASTY Right 06/15/2021    Procedure: TOTAL HIP ARTHROPLASTY ANTERIOR APPROACH;  Surgeon: Fidel Rogue, MD;  Location: [facility] ORS;  Service: Orthopedics;  Laterality: Right;                 Family History  Problem Relation Age of Onset   Cancer Mother     Hearing loss Mother     CVA Father              Social History         Socioeconomic History   Marital status: Married      Spouse name: Not on file   Number of children: Not on file   Years of education: Not on file   Highest education level: Doctorate  Occupational History   Not on file  Tobacco Use   Smoking status: Former      Current packs/day: 0.00      Average packs/day: 1.5 packs/day for 40.0 years (60.0 ttl pk-yrs)      Types: Cigarettes      Start date: 10/15/1960      Quit date: 10/15/2000      Years since quitting: 23.2      Passive exposure: Past   Smokeless tobacco: Never  Vaping Use   Vaping status: Never Used  Substance and Sexual Activity   Alcohol  use: Yes       Alcohol /week: 28.0 standard drinks of alcohol       Types: 28 Glasses of wine per week      Comment: 4 glasses per day   Drug use: Never   Sexual activity: Not Currently  Other Topics Concern   Not on file  Social History Narrative   Not on file    Social Drivers of Health        Financial Resource Strain: Low Risk  (11/13/2023)    Overall Financial Resource Strain (CARDIA)     Difficulty of Paying Living Expenses: Not hard at all  Food Insecurity: No Food Insecurity (11/13/2023)    Hunger Vital Sign     Worried About Running Out of Food in the Last Year: Never true     Ran Out of Food in the Last Year: Never true  Transportation Needs: No Transportation Needs (11/13/2023)    PRAPARE - Therapist, Art (Medical): No     Lack of Transportation (Non-Medical): No  Physical Activity: Insufficiently Active (11/13/2023)    Exercise Vital Sign     Days of Exercise per Week: 3 days     Minutes of Exercise per Session: 30 min  Stress: No Stress Concern Present (11/13/2023)    [employer] of Occupational Health - Occupational Stress Questionnaire     Feeling of Stress: Not at all  Social Connections: Moderately Integrated (11/13/2023)    Social Connection and Isolation Panel     Frequency of Communication with Friends and Family: More than three times a week     Frequency of Social Gatherings with Friends and Family: Three times a week     Attends Religious Services: Never     Active Member of Clubs or Organizations: Yes     Attends Banker Meetings: More than 4 times per year     Marital Status: Married  Catering Manager Violence: Not At Risk (11/13/2023)    Humiliation, Afraid, Rape, and Kick questionnaire     Fear of Current or Ex-Partner: No  Emotionally Abused: No     Physically Abused: No     Sexually Abused: No        BP 116/74   Pulse 94   Ht 5' 11 (1.803 m)   Wt 218 lb 3.2 oz (99 kg)   SpO2 95%   BMI 30.43 kg/m    Physical  Exam:   Well appearing NAD HEENT: Unremarkable Neck:  No JVD, no thyromegally Lymphatics:  No adenopathy Back:  No CVA tenderness Lungs:  Clear with no wheezes HEART:  Regular rate rhythm, 3/6 AS murmur, no rubs, no clicks Abd:  soft, positive bowel sounds, no organomegally, no rebound, no guarding Ext:  2 plus pulses, no edema, no cyanosis, no clubbing Skin:  No rashes no nodules Neuro:  CN II through XII intact, motor grossly intact   DEVICE  Normal device function.  See PaceArt for details.    Assess/Plan: Pacing induced LV dysfunction - I have discussed the treatment options. He has class 3 symptoms, a pacing induced LBBB and QRS duration of 175 and CHB with no escape. I have reviewed the indications/risks/benefits/goals/expectations of BIV PPM upgrade and he wishes to proceed. AS - he will need TAVR in the next year or two I would suspect.   Danelle Cesia Orf,MD

## 2024-03-19 NOTE — Progress Notes (Signed)
 Discharge instructions reviewed with patient and wife Adrien at the bedside. Denies questions or concerns. PT was able to ambulate in the hallway, void in the bathroom without difficulty. PT escorted from the unit via wheel chair to personal vehicle.

## 2024-03-20 ENCOUNTER — Encounter (HOSPITAL_COMMUNITY): Payer: Self-pay | Admitting: Internal Medicine

## 2024-03-20 MED FILL — Lidocaine HCl Local Preservative Free (PF) Inj 1%: INTRAMUSCULAR | Qty: 30 | Status: AC

## 2024-03-20 MED FILL — Midazolam HCl Inj 2 MG/2ML (Base Equivalent): INTRAMUSCULAR | Qty: 1 | Status: AC

## 2024-03-20 MED FILL — Gentamicin Sulfate Inj 40 MG/ML: INTRAMUSCULAR | Qty: 80 | Status: AC

## 2024-03-27 ENCOUNTER — Ambulatory Visit: Payer: Medicare Other

## 2024-03-27 DIAGNOSIS — I442 Atrioventricular block, complete: Secondary | ICD-10-CM

## 2024-03-28 LAB — CUP PACEART REMOTE DEVICE CHECK
Battery Voltage: 70
Date Time Interrogation Session: 20251211071951
Implantable Lead Connection Status: 753985
Implantable Lead Connection Status: 753985
Implantable Lead Implant Date: 20210614
Implantable Lead Implant Date: 20210614
Implantable Lead Location: 753859
Implantable Lead Location: 753860
Implantable Lead Model: 377171
Implantable Lead Model: 377171
Implantable Lead Serial Number: 7000244971
Implantable Lead Serial Number: 7000271886
Implantable Pulse Generator Implant Date: 20210614
Pulse Gen Model: 407145
Pulse Gen Serial Number: 69870190

## 2024-03-30 ENCOUNTER — Ambulatory Visit: Payer: Self-pay | Admitting: Internal Medicine

## 2024-04-01 ENCOUNTER — Ambulatory Visit: Attending: Internal Medicine

## 2024-04-01 DIAGNOSIS — I442 Atrioventricular block, complete: Secondary | ICD-10-CM

## 2024-04-01 LAB — CUP PACEART INCLINIC DEVICE CHECK
Battery Remaining Percentage: 70 %
Brady Statistic RA Percent Paced: 2 %
Brady Statistic RV Percent Paced: 100 %
Date Time Interrogation Session: 20251216104859
Implantable Lead Connection Status: 753985
Implantable Lead Connection Status: 753985
Implantable Lead Implant Date: 20210614
Implantable Lead Implant Date: 20210614
Implantable Lead Location: 753859
Implantable Lead Location: 753860
Implantable Lead Model: 377171
Implantable Lead Model: 377171
Implantable Lead Serial Number: 7000244971
Implantable Lead Serial Number: 7000271886
Implantable Pulse Generator Implant Date: 20210614
Lead Channel Impedance Value: 487 Ohm
Lead Channel Impedance Value: 604 Ohm
Lead Channel Pacing Threshold Amplitude: 0.6 V
Lead Channel Pacing Threshold Amplitude: 0.7 V
Lead Channel Pacing Threshold Pulse Width: 0.4 ms
Lead Channel Pacing Threshold Pulse Width: 0.4 ms
Lead Channel Setting Pacing Amplitude: 2 V
Lead Channel Setting Pacing Amplitude: 2 V
Lead Channel Setting Pacing Pulse Width: 0.4 ms
Pulse Gen Model: 407145
Pulse Gen Serial Number: 69870190

## 2024-04-01 NOTE — Patient Instructions (Signed)

## 2024-04-01 NOTE — Progress Notes (Signed)
 Normal dual chamber pacemaker wound check. Presenting rhythm: AS/VP 92. Wound well healed. Routine testing performed. Thresholds, sensing, and impedance consistent with implant measurements. No episodes. Pt enrolled in remote follow-up.  Unsuccessful CRT-P upgrade w/ Dr. Waddell on 03/27/2024.

## 2024-04-03 NOTE — Progress Notes (Signed)
 Remote PPM Transmission

## 2024-04-08 ENCOUNTER — Ambulatory Visit (HOSPITAL_BASED_OUTPATIENT_CLINIC_OR_DEPARTMENT_OTHER): Admitting: Family Medicine

## 2024-04-21 ENCOUNTER — Ambulatory Visit (HOSPITAL_BASED_OUTPATIENT_CLINIC_OR_DEPARTMENT_OTHER): Admitting: Family Medicine

## 2024-04-21 ENCOUNTER — Encounter (HOSPITAL_BASED_OUTPATIENT_CLINIC_OR_DEPARTMENT_OTHER): Payer: Self-pay | Admitting: Family Medicine

## 2024-04-21 VITALS — BP 137/73 | HR 92 | Temp 97.9°F | Resp 20 | Ht 71.0 in | Wt 213.0 lb

## 2024-04-21 DIAGNOSIS — M25571 Pain in right ankle and joints of right foot: Secondary | ICD-10-CM | POA: Insufficient documentation

## 2024-04-21 DIAGNOSIS — Z23 Encounter for immunization: Secondary | ICD-10-CM

## 2024-04-21 DIAGNOSIS — G8929 Other chronic pain: Secondary | ICD-10-CM | POA: Diagnosis not present

## 2024-04-21 DIAGNOSIS — E782 Mixed hyperlipidemia: Secondary | ICD-10-CM

## 2024-04-21 DIAGNOSIS — I5020 Unspecified systolic (congestive) heart failure: Secondary | ICD-10-CM | POA: Diagnosis not present

## 2024-04-21 DIAGNOSIS — I10 Essential (primary) hypertension: Secondary | ICD-10-CM

## 2024-04-21 NOTE — Assessment & Plan Note (Signed)
 Patient with history of ankle injuries in the past, recalls prior ankle fracture.  He has been having some discomfort and pain with ankle, however generally not limiting activities as his activities are more so limited due to cardiac issues.  We did discuss considerations today in regards to further evaluation, he would prefer to hold off on this for now as he does not feel it would be as beneficial since he is more so limited by his heart at present anyways.  Ultimately, if he decides that he would like to proceed with further evaluation and referral, advised for him to let us  know and we can place referral at any time.

## 2024-04-21 NOTE — Assessment & Plan Note (Signed)
 Continues with atorvastatin  and ezetimibe , denies any issues with medication.  He continues to follow with cardiology related to medication management.  Has had improved cholesterol numbers with medications.  No concerns otherwise today. Recommend continue with current medication regimen, no changes today.

## 2024-04-21 NOTE — Progress Notes (Signed)
" ° ° °  Procedures performed today:    None.  Independent interpretation of notes and tests performed by another provider:   None.  Brief History, Exam, Impression, and Recommendations:    BP 137/73 (BP Location: Left Arm, Patient Position: Sitting, Cuff Size: Normal)   Pulse 92   Temp 97.9 F (36.6 C) (Oral)   Resp 20   Ht 5' 11 (1.803 m)   Wt 213 lb (96.6 kg)   SpO2 99%   BMI 29.71 kg/m   Primary hypertension Assessment & Plan: Blood pressure appropriate in office today.  No concerns related to chest pain or headaches.  Has not been checking blood pressure at home given that blood pressure has been well-controlled recently.  He continues with medications as prescribed, no concerns currently. No changes to medication regimen today.  We will plan to follow-up in about 6 months or sooner as needed.   Chronic pain of right ankle Assessment & Plan: Patient with history of ankle injuries in the past, recalls prior ankle fracture.  He has been having some discomfort and pain with ankle, however generally not limiting activities as his activities are more so limited due to cardiac issues.  We did discuss considerations today in regards to further evaluation, he would prefer to hold off on this for now as he does not feel it would be as beneficial since he is more so limited by his heart at present anyways.  Ultimately, if he decides that he would like to proceed with further evaluation and referral, advised for him to let us  know and we can place referral at any time.   Mixed hyperlipidemia Assessment & Plan: Continues with atorvastatin  and ezetimibe , denies any issues with medication.  He continues to follow with cardiology related to medication management.  Has had improved cholesterol numbers with medications.  No concerns otherwise today. Recommend continue with current medication regimen, no changes today.   Heart failure with mildly reduced ejection fraction (HFmrEF)  [facility]) Assessment & Plan: Currently following cardiology, has upcoming appointment. Currently NYHA class II, being managed with GDMT.  Recommend continue close follow-up with cardiology.  No medication changes today.   Immunization due -     Pneumococcal Conjugate PCV21(Capvaxive)  Patient is also reporting mild trauma, his initial thought is that it could be related to medication such as losartan .  He will note tremor when he holds out both hands in front of him.  He is not aware of any other specific changes.  We discussed considerations and he would prefer to monitor symptoms for now as opposed to proceeding with referral or any testing at this time.  Advised to let us  know if he does change his mind before next office visit.  Return in about 6 months (around 10/19/2024) for hypertension, hyperlipidemia.   ___________________________________________ Bishop Vanderwerf de [place], MD, ABFM, [facility] "

## 2024-04-21 NOTE — Assessment & Plan Note (Signed)
 Blood pressure appropriate in office today.  No concerns related to chest pain or headaches.  Has not been checking blood pressure at home given that blood pressure has been well-controlled recently.  He continues with medications as prescribed, no concerns currently. No changes to medication regimen today.  We will plan to follow-up in about 6 months or sooner as needed.

## 2024-04-21 NOTE — Assessment & Plan Note (Signed)
 Currently following cardiology, has upcoming appointment. Currently NYHA class II, being managed with GDMT.  Recommend continue close follow-up with cardiology.  No medication changes today.

## 2024-04-22 ENCOUNTER — Encounter: Payer: Self-pay | Admitting: Cardiovascular Disease

## 2024-04-22 ENCOUNTER — Ambulatory Visit: Attending: Cardiovascular Disease | Admitting: Cardiovascular Disease

## 2024-04-22 VITALS — BP 132/78 | HR 84 | Ht 71.0 in | Wt 213.0 lb

## 2024-04-22 DIAGNOSIS — E782 Mixed hyperlipidemia: Secondary | ICD-10-CM

## 2024-04-22 DIAGNOSIS — I350 Nonrheumatic aortic (valve) stenosis: Secondary | ICD-10-CM

## 2024-04-22 DIAGNOSIS — I771 Stricture of artery: Secondary | ICD-10-CM | POA: Diagnosis not present

## 2024-04-22 DIAGNOSIS — I10 Essential (primary) hypertension: Secondary | ICD-10-CM | POA: Diagnosis not present

## 2024-04-22 DIAGNOSIS — I519 Heart disease, unspecified: Secondary | ICD-10-CM | POA: Diagnosis not present

## 2024-04-22 DIAGNOSIS — I2510 Atherosclerotic heart disease of native coronary artery without angina pectoris: Secondary | ICD-10-CM

## 2024-04-22 DIAGNOSIS — R0989 Other specified symptoms and signs involving the circulatory and respiratory systems: Secondary | ICD-10-CM

## 2024-04-22 DIAGNOSIS — I442 Atrioventricular block, complete: Secondary | ICD-10-CM | POA: Diagnosis not present

## 2024-04-22 NOTE — Assessment & Plan Note (Signed)
 Bilateral carotid bruits probably related to transmitted murmur status post left carotid endarterectomy performed Dr. Sheree 03/27/2023.  His most recent carotid Dopplers revealed widely patent endarterectomy site.

## 2024-04-22 NOTE — Assessment & Plan Note (Signed)
 History of complete heart block status post permanent transvenous pacemaker implantation by Dr. Waddell.  This was done because of syncope and bradycardia.  His last EKG showed a paced rhythm.  He did have a decline in his EF thought to be pacemaker mediated and patient was sent back to Dr. Waddell for consideration of BiV upgrade however he has thrombosed subclavian vein making this technically unfeasible.

## 2024-04-22 NOTE — Progress Notes (Signed)
 "     04/22/2024 Glenn Collins   04/08/1946  [id number]  Primary Physician de [place], Raymond J, MD Primary Cardiologist: Dorn JINNY Lesches MD [facility] [place] [place], [place]  HPI:  Glenn Collins is a 79 y.o.  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    I last saw him in the office 10/17/2023.SABRA  He does have a history of CAD status post RCA and LAD stenting in 2007 with 3 intervention in 2017 of the RCA for in-stent restenosis.  History of hypertension and hyperlipidemia.  She never had a heart attack or stroke.  He denies chest pain or shortness of breath.  He smoked remotely but stopped 20 years ago.  He has a history of claudication which is fairly symmetric and lifestyle limiting especially walking up an incline.  He was evaluated in [place] and told he had blockages.   I did begin him on Pletal  which has resulted in significant improvement in his claudication symptoms.  He had carotid Dopplers that showed no evidence of ICA stenosis.   He apparently had several syncopal episodes and was noted to be bradycardic.  His beta-blocker was held but he continued to have bradycardia related syncope and ultimately underwent dual-chamber permanent transvenous pacemaker insertion by Dr. Waddell 1 week ago.  His Pletal  was discontinued at that time as the plus his beta-blocker.     Since I saw him in the 6 months ago he continues to do well.  He denies chest pain shortness of breath or claudication.  He did have elective left carotid endarterectomy performed Dr. Sheree 03/27/2023.  I referred him to Dr. Zenaida who evaluated him for his slight decline in EF and thought this might be pacemaker mediated.  He did refer him to Dr. Waddell to consider BiV upgrade/CRT which was attempted but aborted because of subclavian  vein thrombosis.  He is on GDMT.  Major complaints are of fatigue but denies chest pain or shortness of breath.  Does have moderate aortic stenosis which we continue to follow noninvasively.   Active Medications[1]   Allergies[2]  Social History   Socioeconomic History   Marital status: Married    Spouse name: Not on file   Number of children: Not on file   Years of education: Not on file   Highest education level: Doctorate  Occupational History   Not on file  Tobacco Use   Smoking status: Former    Current packs/day: 0.00    Average packs/day: 1.5 packs/day for 40.0 years (60.0 ttl pk-yrs)    Types: Cigarettes    Start date: 10/15/1960    Quit date: 10/15/2000    Years since quitting: 23.5    Passive exposure: Past   Smokeless tobacco: Never  Vaping Use   Vaping status: Never Used  Substance and Sexual Activity   Alcohol  use: Yes    Alcohol /week: 28.0 standard drinks of alcohol     Types: 28 Glasses of wine per week    Comment: 4 glasses per day   Drug use: Never   Sexual activity: Not Currently  Other Topics Concern   Not on file  Social History Narrative   Not on file   Social Drivers of Health   Tobacco Use: Medium Risk (04/22/2024)   Patient History    Smoking Tobacco Use: Former  Smokeless Tobacco Use: Never    Passive Exposure: Past  Financial Resource Strain: Low Risk (04/19/2024)   Overall Financial Resource Strain (CARDIA)    Difficulty of Paying Living Expenses: Not hard at all  Food Insecurity: No Food Insecurity (04/19/2024)   Epic    Worried About Programme Researcher, Broadcasting/film/video in the Last Year: Never true    Ran Out of Food in the Last Year: Never true  Transportation Needs: No Transportation Needs (04/19/2024)   Epic    Lack of Transportation (Medical): No    Lack of Transportation (Non-Medical): No  Physical Activity: Insufficiently Active (04/19/2024)   Exercise Vital Sign    Days of Exercise per Week: 3 days    Minutes of Exercise per Session: 20 min   Stress: No Stress Concern Present (04/19/2024)   [employer] of Occupational Health - Occupational Stress Questionnaire    Feeling of Stress: Not at all  Social Connections: Moderately Isolated (04/19/2024)   Social Connection and Isolation Panel    Frequency of Communication with Friends and Family: Three times a week    Frequency of Social Gatherings with Friends and Family: Once a week    Attends Religious Services: Never    Database Administrator or Organizations: No    Attends Engineer, Structural: Not on file    Marital Status: Married  Catering Manager Violence: Not At Risk (11/13/2023)   Epic    Fear of Current or Ex-Partner: No    Emotionally Abused: No    Physically Abused: No    Sexually Abused: No  Depression (PHQ2-9): Low Risk (11/13/2023)   Depression (PHQ2-9)    PHQ-2 Score: 0  Alcohol  Screen: Low Risk (04/19/2024)   Alcohol  Screen    Last Alcohol  Screening Score (AUDIT): 7  Housing: Low Risk (04/19/2024)   Epic    Unable to Pay for Housing in the Last Year: No    Number of Times Moved in the Last Year: 0    Homeless in the Last Year: No  Utilities: Not At Risk (11/13/2023)   Epic    Threatened with loss of utilities: No  Health Literacy: Adequate Health Literacy (11/13/2023)   B1300 Health Literacy    Frequency of need for help with medical instructions: Never     Review of Systems: General: negative for chills, fever, night sweats or weight changes.  Cardiovascular: negative for chest pain, dyspnea on exertion, edema, orthopnea, palpitations, paroxysmal nocturnal dyspnea or shortness of breath Dermatological: negative for rash Respiratory: negative for cough or wheezing Urologic: negative for hematuria Abdominal: negative for nausea, vomiting, diarrhea, bright red blood per rectum, melena, or hematemesis Neurologic: negative for visual changes, syncope, or dizziness All other systems reviewed and are otherwise negative except as noted  above.    Blood pressure 132/78, pulse 84, height 5' 11 (1.803 m), weight 213 lb (96.6 kg), SpO2 93%.  General appearance: alert and no distress Neck: no adenopathy, no JVD, supple, symmetrical, trachea midline, thyroid not enlarged, symmetric, no tenderness/mass/nodules, and soft bilateral carotid bruits Lungs: clear to auscultation bilaterally Heart: 2/6 outflow tract murmur consistent with aortic stenosis Extremities: extremities normal, atraumatic, no cyanosis or edema Pulses: 2+ and symmetric Skin: Skin color, texture, turgor normal. No rashes or lesions Neurologic: Grossly normal  EKG not performed today      ASSESSMENT AND PLAN:   Hypertension History of essential hypertension blood pressure measured today at 132/78.  He is on losartan  and metoprolol .  CAD (coronary artery disease)  History of CAD status post RCA and LAD stenting back in 2007 and reintervention 2017 for in-stent restenosis of the RCA.  He has had no intervention since and denies chest pain.  Bilateral carotid bruits Bilateral carotid bruits probably related to transmitted murmur status post left carotid endarterectomy performed Dr. Sheree 03/27/2023.  His most recent carotid Dopplers revealed widely patent endarterectomy site.  Hyperlipidemia History of hyperlipidemia on high-dose statin therapy with lipid profile performed 03/28/2023 revealing total cholesterol 141, LDL 73 and HDL 59.  Complete heart block (HCC) History of complete heart block status post permanent transvenous pacemaker implantation by Dr. Waddell.  This was done because of syncope and bradycardia.  His last EKG showed a paced rhythm.  He did have a decline in his EF thought to be pacemaker mediated and patient was sent back to Dr. Waddell for consideration of BiV upgrade however he has thrombosed subclavian vein making this technically unfeasible.  Stenosis of left subclavian artery Carotid Dopplers performed 03/12/2024 did suggest right  subclavian artery stenosis although there was antegrade vertebral blood flow and minimal upper extremity blood pressure differential.  Aortic stenosis History of moderate aortic stenosis by 2D echo performed 10/02/2023 with an EF of 40 to 45%, and an aortic valve area of 1.48 cm with a mean gradient of 30 mmHg.  At this point, I do not think his aortic stenosis is significant enough to warrant intervention but we will continue to follow it noninvasively on annual basis.  Left ventricular dysfunction Ejection fraction has declined from 50 to 55% down to 40 to 45% by 2D echo performed 10/02/2023.  I did refer him to Dr. Zenaida, advanced heart failure who saw him and thought this may be pacemaker mediated.  He is on losartan  and metoprolol /GDMT.  He was referred back to Dr. Waddell to consider BiV upgrade/CRT but unfortunately this was unable to be done because of subclavian vein thrombosis.  He is minimally symptomatic.     Dorn DOROTHA Lesches MD FACP,FACC,FAHA, FSCAI 04/22/2024 2:35 PM    [1]  Current Meds  Medication Sig   aspirin  EC 81 MG tablet Take 81 mg by mouth daily. Swallow whole.   atorvastatin  (LIPITOR ) 80 MG tablet TAKE 1 TABLET BY MOUTH DAILY AT 6:00 IN THE EVENING   diphenhydrAMINE  (BENADRYL ) 25 MG tablet Take 25 mg by mouth at bedtime.   ezetimibe  (ZETIA ) 10 MG tablet Take 1 tablet (10 mg total) by mouth daily.   losartan  (COZAAR ) 25 MG tablet Take 1 tablet (25 mg total) by mouth daily.   metoprolol  succinate (TOPROL -XL) 50 MG 24 hr tablet Take 1 tablet (50 mg total) by mouth daily. Take with or immediately following a meal.   Multiple Vitamin (MULTIVITAMIN WITH MINERALS) TABS tablet Take 1 tablet by mouth daily.   nitroGLYCERIN  (NITROSTAT ) 0.4 MG SL tablet Place 1 tablet (0.4 mg total) under the tongue every 5 (five) minutes x 3 doses as needed for chest pain.   sodium chloride  (OCEAN) 0.65 % SOLN nasal spray Place 1 spray into both nostrils as needed for congestion.  [2] No Known  Allergies  "

## 2024-04-22 NOTE — Assessment & Plan Note (Signed)
 Carotid Dopplers performed 03/12/2024 did suggest right subclavian artery stenosis although there was antegrade vertebral blood flow and minimal upper extremity blood pressure differential.

## 2024-04-22 NOTE — Assessment & Plan Note (Signed)
 History of moderate aortic stenosis by 2D echo performed 10/02/2023 with an EF of 40 to 45%, and an aortic valve area of 1.48 cm with a mean gradient of 30 mmHg.  At this point, I do not think his aortic stenosis is significant enough to warrant intervention but we will continue to follow it noninvasively on annual basis.

## 2024-04-22 NOTE — Assessment & Plan Note (Signed)
 History of CAD status post RCA and LAD stenting back in 2007 and reintervention 2017 for in-stent restenosis of the RCA.  He has had no intervention since and denies chest pain.

## 2024-04-22 NOTE — Assessment & Plan Note (Signed)
 Ejection fraction has declined from 50 to 55% down to 40 to 45% by 2D echo performed 10/02/2023.  I did refer him to Dr. Zenaida, advanced heart failure who saw him and thought this may be pacemaker mediated.  He is on losartan  and metoprolol /GDMT.  He was referred back to Dr. Waddell to consider BiV upgrade/CRT but unfortunately this was unable to be done because of subclavian vein thrombosis.  He is minimally symptomatic.

## 2024-04-22 NOTE — Assessment & Plan Note (Signed)
 History of hyperlipidemia on high-dose statin therapy with lipid profile performed 03/28/2023 revealing total cholesterol 141, LDL 73 and HDL 59.

## 2024-04-22 NOTE — Patient Instructions (Signed)
 Medication Instructions:  Your physician recommends that you continue on your current medications as directed. Please refer to the Current Medication list given to you today.  *If you need a refill on your cardiac medications before your next appointment, please call your pharmacy*  Testing/Procedures: Your physician has requested that you have an echocardiogram. Echocardiography is a painless test that uses sound waves to create images of your heart. It provides your doctor with information about the size and shape of your heart and how well your hearts chambers and valves are working. This procedure takes approximately one hour. There are no restrictions for this procedure. Please do NOT wear cologne, perfume, aftershave, or lotions (deodorant is allowed). Please arrive 15 minutes prior to your appointment time.  Please note: We ask at that you not bring children with you during ultrasound (echo/ vascular) testing. Due to room size and safety concerns, children are not allowed in the ultrasound rooms during exams. Our front office staff cannot provide observation of children in our lobby area while testing is being conducted. An adult accompanying a patient to their appointment will only be allowed in the ultrasound room at the discretion of the ultrasound technician under special circumstances. We apologize for any inconvenience. **To do in June**   Follow-Up: At [facility], you and your health needs are our priority.  As part of our continuing mission to provide you with exceptional heart care, our providers are all part of one team.  This team includes your primary Cardiologist (physician) and Advanced Practice Providers or APPs (Physician Assistants and Nurse Practitioners) who all work together to provide you with the care you need, when you need it.  Your next appointment:   6 month(s)  Provider:   Lum Louis, NP, Aline Door, PA-C, Kathleen Johnson, PA-C, Hao Meng,  PA-C, Damien Braver, NP, or Katlyn West, NP        Then, Dorn Lesches, MD will plan to see you again in 12 month(s).

## 2024-04-22 NOTE — Assessment & Plan Note (Signed)
 History of essential hypertension blood pressure measured today at 132/78.  He is on losartan  and metoprolol .

## 2024-05-08 ENCOUNTER — Encounter (HOSPITAL_BASED_OUTPATIENT_CLINIC_OR_DEPARTMENT_OTHER): Payer: Self-pay | Admitting: Family Medicine

## 2024-05-08 DIAGNOSIS — G8929 Other chronic pain: Secondary | ICD-10-CM

## 2024-06-04 ENCOUNTER — Ambulatory Visit (HOSPITAL_BASED_OUTPATIENT_CLINIC_OR_DEPARTMENT_OTHER): Admitting: Orthopaedic Surgery

## 2024-06-18 ENCOUNTER — Ambulatory Visit: Admitting: Pulmonary Disease

## 2024-06-26 ENCOUNTER — Ambulatory Visit

## 2024-09-15 ENCOUNTER — Ambulatory Visit (HOSPITAL_COMMUNITY)

## 2024-09-25 ENCOUNTER — Ambulatory Visit

## 2024-10-20 ENCOUNTER — Ambulatory Visit (HOSPITAL_BASED_OUTPATIENT_CLINIC_OR_DEPARTMENT_OTHER): Admitting: Family Medicine

## 2024-12-09 ENCOUNTER — Encounter (HOSPITAL_BASED_OUTPATIENT_CLINIC_OR_DEPARTMENT_OTHER)

## 2024-12-25 ENCOUNTER — Ambulatory Visit

## 2025-03-26 ENCOUNTER — Ambulatory Visit
